# Patient Record
Sex: Female | Born: 1935 | Race: White | Hispanic: No | Smoking: Never smoker
Health system: Southern US, Community
[De-identification: ages and names within clinical notes are randomized; demographics above are authoritative.]

## PROBLEM LIST (undated history)

## (undated) DIAGNOSIS — R42 Dizziness and giddiness: Secondary | ICD-10-CM

## (undated) DIAGNOSIS — F0390 Unspecified dementia without behavioral disturbance: Secondary | ICD-10-CM

## (undated) DIAGNOSIS — Z87828 Personal history of other (healed) physical injury and trauma: Secondary | ICD-10-CM

## (undated) DIAGNOSIS — D649 Anemia, unspecified: Secondary | ICD-10-CM

## (undated) DIAGNOSIS — N390 Urinary tract infection, site not specified: Secondary | ICD-10-CM

## (undated) DIAGNOSIS — E039 Hypothyroidism, unspecified: Secondary | ICD-10-CM

## (undated) DIAGNOSIS — B9620 Unspecified Escherichia coli [E. coli] as the cause of diseases classified elsewhere: Secondary | ICD-10-CM

## (undated) DIAGNOSIS — K259 Gastric ulcer, unspecified as acute or chronic, without hemorrhage or perforation: Secondary | ICD-10-CM

## (undated) DIAGNOSIS — J189 Pneumonia, unspecified organism: Secondary | ICD-10-CM

## (undated) DIAGNOSIS — I10 Essential (primary) hypertension: Secondary | ICD-10-CM

## (undated) DIAGNOSIS — H3530 Unspecified macular degeneration: Secondary | ICD-10-CM

## (undated) DIAGNOSIS — N183 Chronic kidney disease, stage 3 (moderate): Secondary | ICD-10-CM

## (undated) DIAGNOSIS — Z8781 Personal history of (healed) traumatic fracture: Secondary | ICD-10-CM

## (undated) HISTORY — DX: Urinary tract infection, site not specified: N39.0

## (undated) HISTORY — PX: THYROID SURGERY: SHX805

## (undated) HISTORY — DX: Unspecified macular degeneration: H35.30

## (undated) HISTORY — PX: FRACTURE SURGERY: SHX138

## (undated) HISTORY — PX: MASTECTOMY: SHX3

## (undated) HISTORY — PX: ABDOMINAL HYSTERECTOMY: SHX81

## (undated) HISTORY — PX: CARPAL TUNNEL RELEASE: SHX101

## (undated) HISTORY — DX: Unspecified Escherichia coli (E. coli) as the cause of diseases classified elsewhere: B96.20

---

## 2000-12-08 ENCOUNTER — Encounter: Payer: Self-pay | Admitting: *Deleted

## 2000-12-08 ENCOUNTER — Ambulatory Visit (HOSPITAL_COMMUNITY): Admission: RE | Admit: 2000-12-08 | Discharge: 2000-12-08 | Payer: Self-pay | Admitting: *Deleted

## 2001-01-18 ENCOUNTER — Encounter: Payer: Self-pay | Admitting: *Deleted

## 2001-01-18 ENCOUNTER — Ambulatory Visit (HOSPITAL_COMMUNITY): Admission: RE | Admit: 2001-01-18 | Discharge: 2001-01-18 | Payer: Self-pay | Admitting: *Deleted

## 2001-02-27 ENCOUNTER — Encounter: Payer: Self-pay | Admitting: *Deleted

## 2001-02-27 ENCOUNTER — Ambulatory Visit (HOSPITAL_COMMUNITY): Admission: RE | Admit: 2001-02-27 | Discharge: 2001-02-27 | Payer: Self-pay | Admitting: *Deleted

## 2001-11-29 ENCOUNTER — Emergency Department (HOSPITAL_COMMUNITY): Admission: EM | Admit: 2001-11-29 | Discharge: 2001-11-29 | Payer: Self-pay | Admitting: Emergency Medicine

## 2002-12-25 ENCOUNTER — Ambulatory Visit (HOSPITAL_BASED_OUTPATIENT_CLINIC_OR_DEPARTMENT_OTHER): Admission: RE | Admit: 2002-12-25 | Discharge: 2002-12-25 | Payer: Self-pay | Admitting: *Deleted

## 2002-12-25 ENCOUNTER — Encounter: Admission: RE | Admit: 2002-12-25 | Discharge: 2002-12-25 | Payer: Self-pay | Admitting: *Deleted

## 2002-12-25 ENCOUNTER — Encounter: Payer: Self-pay | Admitting: *Deleted

## 2003-01-01 ENCOUNTER — Ambulatory Visit (HOSPITAL_BASED_OUTPATIENT_CLINIC_OR_DEPARTMENT_OTHER): Admission: RE | Admit: 2003-01-01 | Discharge: 2003-01-01 | Payer: Self-pay | Admitting: *Deleted

## 2003-09-26 ENCOUNTER — Ambulatory Visit (HOSPITAL_COMMUNITY): Admission: RE | Admit: 2003-09-26 | Discharge: 2003-09-26 | Payer: Self-pay | Admitting: Family Medicine

## 2003-12-04 ENCOUNTER — Ambulatory Visit (HOSPITAL_COMMUNITY): Admission: RE | Admit: 2003-12-04 | Discharge: 2003-12-04 | Payer: Self-pay | Admitting: Internal Medicine

## 2005-03-11 ENCOUNTER — Ambulatory Visit (HOSPITAL_COMMUNITY): Admission: RE | Admit: 2005-03-11 | Discharge: 2005-03-11 | Payer: Self-pay | Admitting: Family Medicine

## 2005-04-21 ENCOUNTER — Ambulatory Visit: Payer: Self-pay | Admitting: *Deleted

## 2005-05-04 ENCOUNTER — Ambulatory Visit: Payer: Self-pay | Admitting: *Deleted

## 2005-05-04 ENCOUNTER — Ambulatory Visit (HOSPITAL_COMMUNITY): Admission: RE | Admit: 2005-05-04 | Discharge: 2005-05-04 | Payer: Self-pay | Admitting: *Deleted

## 2005-05-11 ENCOUNTER — Ambulatory Visit: Payer: Self-pay | Admitting: *Deleted

## 2006-06-09 ENCOUNTER — Ambulatory Visit (HOSPITAL_COMMUNITY): Admission: RE | Admit: 2006-06-09 | Discharge: 2006-06-09 | Payer: Self-pay | Admitting: Family Medicine

## 2006-08-24 ENCOUNTER — Ambulatory Visit: Payer: Self-pay | Admitting: Internal Medicine

## 2006-09-11 ENCOUNTER — Observation Stay (HOSPITAL_COMMUNITY): Admission: EM | Admit: 2006-09-11 | Discharge: 2006-09-14 | Payer: Self-pay | Admitting: Emergency Medicine

## 2006-09-25 ENCOUNTER — Ambulatory Visit (HOSPITAL_COMMUNITY): Admission: RE | Admit: 2006-09-25 | Discharge: 2006-09-25 | Payer: Self-pay | Admitting: Internal Medicine

## 2006-09-25 ENCOUNTER — Ambulatory Visit: Payer: Self-pay | Admitting: Internal Medicine

## 2006-11-09 ENCOUNTER — Ambulatory Visit: Payer: Self-pay | Admitting: Internal Medicine

## 2006-12-29 ENCOUNTER — Ambulatory Visit (HOSPITAL_COMMUNITY): Admission: RE | Admit: 2006-12-29 | Discharge: 2006-12-29 | Payer: Self-pay | Admitting: Family Medicine

## 2007-11-08 ENCOUNTER — Ambulatory Visit (HOSPITAL_COMMUNITY): Admission: RE | Admit: 2007-11-08 | Discharge: 2007-11-08 | Payer: Self-pay | Admitting: Family Medicine

## 2007-12-26 ENCOUNTER — Ambulatory Visit (HOSPITAL_COMMUNITY): Admission: RE | Admit: 2007-12-26 | Discharge: 2007-12-26 | Payer: Self-pay | Admitting: Family Medicine

## 2008-01-20 ENCOUNTER — Emergency Department (HOSPITAL_COMMUNITY): Admission: EM | Admit: 2008-01-20 | Discharge: 2008-01-20 | Payer: Self-pay | Admitting: Emergency Medicine

## 2009-05-07 ENCOUNTER — Ambulatory Visit (HOSPITAL_COMMUNITY): Admission: RE | Admit: 2009-05-07 | Discharge: 2009-05-07 | Payer: Self-pay | Admitting: Family Medicine

## 2009-06-04 ENCOUNTER — Ambulatory Visit (HOSPITAL_COMMUNITY): Admission: RE | Admit: 2009-06-04 | Discharge: 2009-06-04 | Payer: Self-pay | Admitting: Family Medicine

## 2009-06-24 ENCOUNTER — Inpatient Hospital Stay (HOSPITAL_COMMUNITY): Admission: EM | Admit: 2009-06-24 | Discharge: 2009-06-29 | Payer: Self-pay | Admitting: Emergency Medicine

## 2009-06-25 ENCOUNTER — Encounter: Payer: Self-pay | Admitting: Gastroenterology

## 2009-06-25 ENCOUNTER — Ambulatory Visit: Payer: Self-pay | Admitting: Gastroenterology

## 2009-06-25 ENCOUNTER — Telehealth: Payer: Self-pay | Admitting: Gastroenterology

## 2009-06-27 ENCOUNTER — Ambulatory Visit: Payer: Self-pay | Admitting: Internal Medicine

## 2009-06-29 ENCOUNTER — Inpatient Hospital Stay: Admission: AD | Admit: 2009-06-29 | Discharge: 2009-07-09 | Payer: Self-pay | Admitting: Internal Medicine

## 2009-07-29 ENCOUNTER — Encounter: Payer: Self-pay | Admitting: Gastroenterology

## 2009-07-30 DIAGNOSIS — K5730 Diverticulosis of large intestine without perforation or abscess without bleeding: Secondary | ICD-10-CM | POA: Insufficient documentation

## 2009-07-30 DIAGNOSIS — M810 Age-related osteoporosis without current pathological fracture: Secondary | ICD-10-CM | POA: Insufficient documentation

## 2009-07-30 DIAGNOSIS — M479 Spondylosis, unspecified: Secondary | ICD-10-CM | POA: Insufficient documentation

## 2009-07-30 DIAGNOSIS — I10 Essential (primary) hypertension: Secondary | ICD-10-CM | POA: Insufficient documentation

## 2009-07-30 DIAGNOSIS — R1310 Dysphagia, unspecified: Secondary | ICD-10-CM | POA: Insufficient documentation

## 2009-07-31 ENCOUNTER — Inpatient Hospital Stay (HOSPITAL_COMMUNITY): Admission: AD | Admit: 2009-07-31 | Discharge: 2009-08-02 | Payer: Self-pay

## 2009-07-31 ENCOUNTER — Ambulatory Visit: Payer: Self-pay | Admitting: Gastroenterology

## 2009-07-31 DIAGNOSIS — K279 Peptic ulcer, site unspecified, unspecified as acute or chronic, without hemorrhage or perforation: Secondary | ICD-10-CM

## 2009-07-31 DIAGNOSIS — K920 Hematemesis: Secondary | ICD-10-CM

## 2009-08-01 ENCOUNTER — Ambulatory Visit: Payer: Self-pay | Admitting: Gastroenterology

## 2009-08-04 LAB — CONVERTED CEMR LAB
HCT: 22.7 % — ABNORMAL LOW (ref 36.0–46.0)
MCHC: 33.7 g/dL (ref 30.0–36.0)
MCV: 92.1 fL (ref 78.0–100.0)
RBC: 2.47 M/uL — ABNORMAL LOW (ref 3.87–5.11)
RDW: 15.6 % — ABNORMAL HIGH (ref 11.5–15.5)

## 2009-08-18 ENCOUNTER — Ambulatory Visit: Payer: Self-pay | Admitting: Gastroenterology

## 2009-08-18 DIAGNOSIS — R197 Diarrhea, unspecified: Secondary | ICD-10-CM | POA: Insufficient documentation

## 2009-08-19 ENCOUNTER — Encounter: Payer: Self-pay | Admitting: Gastroenterology

## 2009-08-20 ENCOUNTER — Telehealth (INDEPENDENT_AMBULATORY_CARE_PROVIDER_SITE_OTHER): Payer: Self-pay

## 2009-08-20 ENCOUNTER — Encounter: Payer: Self-pay | Admitting: Gastroenterology

## 2009-08-20 DIAGNOSIS — K922 Gastrointestinal hemorrhage, unspecified: Secondary | ICD-10-CM | POA: Insufficient documentation

## 2009-08-21 ENCOUNTER — Encounter: Payer: Self-pay | Admitting: Gastroenterology

## 2009-08-27 ENCOUNTER — Telehealth (INDEPENDENT_AMBULATORY_CARE_PROVIDER_SITE_OTHER): Payer: Self-pay

## 2009-08-31 ENCOUNTER — Encounter: Payer: Self-pay | Admitting: Gastroenterology

## 2009-09-15 ENCOUNTER — Encounter: Payer: Self-pay | Admitting: Urgent Care

## 2009-09-22 ENCOUNTER — Ambulatory Visit: Payer: Self-pay | Admitting: Gastroenterology

## 2009-09-23 DIAGNOSIS — K259 Gastric ulcer, unspecified as acute or chronic, without hemorrhage or perforation: Secondary | ICD-10-CM | POA: Insufficient documentation

## 2009-09-24 ENCOUNTER — Encounter: Payer: Self-pay | Admitting: Gastroenterology

## 2009-10-01 ENCOUNTER — Ambulatory Visit (HOSPITAL_COMMUNITY): Admission: RE | Admit: 2009-10-01 | Discharge: 2009-10-01 | Payer: Self-pay | Admitting: Gastroenterology

## 2009-10-01 ENCOUNTER — Ambulatory Visit: Payer: Self-pay | Admitting: Gastroenterology

## 2009-10-09 ENCOUNTER — Encounter: Payer: Self-pay | Admitting: Urgent Care

## 2009-10-28 ENCOUNTER — Encounter: Payer: Self-pay | Admitting: Urgent Care

## 2009-11-10 ENCOUNTER — Encounter: Payer: Self-pay | Admitting: Gastroenterology

## 2009-11-12 ENCOUNTER — Encounter: Payer: Self-pay | Admitting: Gastroenterology

## 2009-11-13 ENCOUNTER — Encounter: Payer: Self-pay | Admitting: Gastroenterology

## 2009-11-16 ENCOUNTER — Encounter: Payer: Self-pay | Admitting: Urgent Care

## 2009-11-23 ENCOUNTER — Encounter: Payer: Self-pay | Admitting: Urgent Care

## 2009-11-30 ENCOUNTER — Encounter: Payer: Self-pay | Admitting: Urgent Care

## 2009-12-08 ENCOUNTER — Encounter: Payer: Self-pay | Admitting: Urgent Care

## 2009-12-15 ENCOUNTER — Encounter: Payer: Self-pay | Admitting: Urgent Care

## 2009-12-22 ENCOUNTER — Encounter (INDEPENDENT_AMBULATORY_CARE_PROVIDER_SITE_OTHER): Payer: Self-pay | Admitting: *Deleted

## 2010-01-29 ENCOUNTER — Encounter: Payer: Self-pay | Admitting: Gastroenterology

## 2010-03-06 ENCOUNTER — Emergency Department (HOSPITAL_COMMUNITY): Admission: EM | Admit: 2010-03-06 | Discharge: 2010-03-06 | Payer: Self-pay | Admitting: Emergency Medicine

## 2010-03-22 ENCOUNTER — Encounter: Payer: Self-pay | Admitting: Urgent Care

## 2010-04-22 ENCOUNTER — Encounter: Payer: Self-pay | Admitting: Gastroenterology

## 2010-09-23 NOTE — Letter (Signed)
Summary: Internal Other/Rx faxed  Internal Other/Rx faxed   Imported By: Cloria Spring LPN 04/54/0981 19:14:78  _____________________________________________________________________  External Attachment:    Type:   Image     Comment:   External Document

## 2010-09-23 NOTE — Medication Information (Signed)
Summary: Tax adviser   Imported By: Rexene Alberts 03/22/2010 11:14:55  _____________________________________________________________________  External Attachment:    Type:   Image     Comment:   External Document  Appended Document: RX Folder    Prescriptions: BENEFIBER  POWD (WHEAT DEXTRIN) 2 teaspoons BID  #245 grams x 11   Entered and Authorized by:   Joselyn Arrow FNP-BC   Signed by:   Joselyn Arrow FNP-BC on 03/22/2010   Method used:   Electronically to        [facility] (retail)       [address]       [place], [place]  [address]       Ph: [phone]       Fax: [phone]   RxID:   [id number]

## 2010-09-23 NOTE — Medication Information (Signed)
Summary: Tax adviser   Imported By: Ricard Dillon 11/13/2009 14:30:27  _____________________________________________________________________  External Attachment:    Type:   Image     Comment:   External Document  Appended Document: RX Folder duplicate

## 2010-09-23 NOTE — Medication Information (Signed)
Summary: Tax adviser   Imported By: Diana Eves 11/30/2009 09:39:34  _____________________________________________________________________  External Attachment:    Type:   Image     Comment:   External Document  Appended Document: RX FolderBENEFIBER    Prescriptions: BENEFIBER  POWD (WHEAT DEXTRIN) 2 teaspoons BID  #245 grams x 5   Entered and Authorized by:   Joselyn Arrow FNP-BC   Signed by:   Joselyn Arrow FNP-BC on 11/30/2009   Method used:   Electronically to        [facility] (retail)       [address]       [place], [place]  [address]       Ph: [phone]       Fax: [phone]   RxID:   [id number]

## 2010-09-23 NOTE — Medication Information (Signed)
Summary: Tax adviser   Imported By: Ricard Dillon 11/16/2009 13:12:59  _____________________________________________________________________  External Attachment:    Type:   Image     Comment:   External Document  Appended Document: RX Folder Please see why we are getting multiple requests from RX care?  Appended Document: RX Folder spoke with Tobi Bastos at [place] and she stated to disregard

## 2010-09-23 NOTE — Medication Information (Signed)
Summary: Tax adviser   Imported By: Diana Eves 09/15/2009 09:42:17  _____________________________________________________________________  External Attachment:    Type:   Image     Comment:   External Document  Appended Document: RX Folder    Prescriptions: BENEFIBER  POWD (WHEAT DEXTRIN) 2 teaspoons BID  #245 grams x 5   Entered and Authorized by:   Joselyn Arrow FNP-BC   Signed by:   Joselyn Arrow FNP-BC on 09/15/2009   Method used:   Electronically to        [facility] (retail)       [address]       [place], [place]  [address]       Ph: [phone]       Fax: [phone]   RxID:   [id number] POLYETHYLENE GLYCOL 3350  POWD (POLYETHYLENE GLYCOL 3350) 17 grams as needed as directed daily for constipation  #527 grams x 11   Entered and Authorized by:   Joselyn Arrow FNP-BC   Signed by:   Joselyn Arrow FNP-BC on 09/15/2009   Method used:   Electronically to        [facility] (retail)       [address]       [place], [place]  [address]       Ph: [phone]       Fax: [phone]   RxID:   [id number]

## 2010-09-23 NOTE — Progress Notes (Signed)
Summary: pt started diarrhea again  Phone Note Call from Patient   Caller: Daughter-in-law / Erskine Squibb Summary of Call: Pt's daughter in law called and said pt has started having diarrhea again. She had some yesterday, but thought it was due to a  salad she had eaten. But this AM she is having more and had to leave the breakfast table, it was so urgent. Wanted to know, should she just start back on Immodium or please advise. she is aware Dr. Darrick Penna away, and Dr. Jena Gauss will be informed in her absence. Initial call taken by: Cloria Spring LPN,  August 27, 2009 10:12 AM     Appended Document: pt started diarrhea again OK to use Imodium 2mg  by mouth two times a day as needed.  Make sure Miralax is being given only as needed.  Keep OV as planned.  Appended Document: pt started diarrhea again Pt's daughter-in-law Erskine Squibb informed. Also, [facility] @ [address] informed and order as above printed and faxed.

## 2010-09-23 NOTE — Letter (Signed)
Summary: Recall Office Visit  [name]  [address]   [place], [place] [address]   Phone: [phone]  Fax: [phone]      Dec 22, 2009   [name] [address] [place], [place]  [address] 22-Oct-1919   Dear [name],   According to our records, it is time for you to schedule a follow-up office visit with [place].   At your convenience, please call [phone] to schedule an office visit. If you have any questions, concerns, or feel that this letter is in error, we would appreciate your call.   Sincerely,    Diana Eves  [facility] Ph: [phone]   Fax: [phone]

## 2010-09-23 NOTE — Medication Information (Signed)
Summary: Tax adviser   Imported By: Peggyann Shoals 12/15/2009 09:32:27  _____________________________________________________________________  External Attachment:    Type:   Image     Comment:   External Document  Appended Document: RX FolderLOPERAMIDE    Prescriptions: IMODIUM A-D 2 MG TABS (LOPERAMIDE HCL) one by mouth two times a day as needed diarrhea  #60 x 1   Entered and Authorized by:   Joselyn Arrow FNP-BC   Signed by:   Joselyn Arrow FNP-BC on 12/15/2009   Method used:   Electronically to        [facility] (retail)       [address]       [place], [place]  [address]       Ph: [phone]       Fax: [phone]   RxID:   [id number]

## 2010-09-23 NOTE — Medication Information (Signed)
Summary: Tax adviser   Imported By: Diana Eves 11/23/2009 10:35:27  _____________________________________________________________________  External Attachment:    Type:   Image     Comment:   External Document  Appended Document: RX Folder DIGESTIVE ADV  Please fax rx to [facility]  Prescriptions: DIGESTIVE ADVANTAGE IBS 1 by mouth daily for IBS  #31 x 5   Entered and Authorized by:   Joselyn Arrow FNP-BC   Signed by:   Joselyn Arrow FNP-BC on 11/23/2009   Method used:   Printed then faxed to ...       [facility] (retail)       [address]       [place], [place]  [address]       Ph: [phone]       Fax: [phone]   RxID:   [id number]     Appended Document: RX Folder Faxed.

## 2010-09-23 NOTE — Medication Information (Signed)
Summary: DIGESTIVE ADV IRRIT  DIGESTIVE ADV IRRIT   Imported By: Rexene Alberts 04/22/2010 15:25:04  _____________________________________________________________________  External Attachment:    Type:   Image     Comment:   External Document  Appended Document: DIGESTIVE ADV IRRIT     PLEASE CALL [facility] AND OK REFILLS x 6 ON   DIGESTIVE ADVANAGE IBS TAKE ONE by mouth DAILY #31 6RFS  Appended Document: DIGESTIVE ADV IRRIT [facility] at [facility] aware

## 2010-09-23 NOTE — Medication Information (Signed)
Summary: Tax adviser   Imported By: Ricard Dillon 11/12/2009 16:40:58  _____________________________________________________________________  External Attachment:    Type:   Image     Comment:   External Document  Appended Document: RX Folder - loperamide   Appended Document: RX Folder Please find out if they rec'd emr refill. It showed up until I signed it. Please make sure they only rec'd one. If they need one, you can call in imodium ad 2mg  #30, one by mouth two times a day as needed diarrhea, 1 refill.  Appended Document: RX Folder Spoke with [facility] @ [facility]. They received two Rx's for Immodium,  i told her to disregard one of them.

## 2010-09-23 NOTE — Medication Information (Signed)
Summary: Tax adviser   Imported By: Diana Eves 11/30/2009 12:27:33  _____________________________________________________________________  External Attachment:    Type:   Image     Comment:   External Document  Appended Document: RX FolderOMEPRAZOLE    Prescriptions: OMEPRAZOLE 20 MG CPDR (OMEPRAZOLE) by mouth bid  #31 x 11   Entered and Authorized by:   Joselyn Arrow FNP-BC   Signed by:   Joselyn Arrow FNP-BC on 11/30/2009   Method used:   Electronically to        [facility] (retail)       [address]       [place], [place]  [address]       Ph: [phone]       Fax: [phone]   RxID:   [id number]     Appended Document: RX Folder [facility] called- pt is on omeprazole once daily- refill was sent back to them with two times a day directions. please clarify directions.  Appended Document: RX FolderOMEPRAZOLE    Prescriptions: OMEPRAZOLE 20 MG CPDR (OMEPRAZOLE) 1 by mouth daily  #31 x 11   Entered and Authorized by:   Joselyn Arrow FNP-BC   Signed by:   Joselyn Arrow FNP-BC on 12/01/2009   Method used:   Electronically to        [facility] (retail)       [address]       [place], [place]  [address]       Ph: [phone]       Fax: [phone]   RxID:   [id number]

## 2010-09-23 NOTE — Medication Information (Signed)
Summary: Tax adviser   Imported By: Diana Eves 10/28/2009 11:48:47  _____________________________________________________________________  External Attachment:    Type:   Image     Comment:   External Document  Appended Document: RX Folder Please get from PCP.  Appended Document: RX Folder Informed [facility] @ [facility].  Appended Document: RX Folder Pt called and informed her.

## 2010-09-23 NOTE — Letter (Signed)
Summary: EGD ORDER  EGD ORDER   Imported By: Ricard Dillon 09/24/2009 12:47:23  _____________________________________________________________________  External Attachment:    Type:   Image     Comment:   External Document

## 2010-09-23 NOTE — Letter (Signed)
Summary: internal other /Rx care fax  internal other /[facility] fax   Imported By: Cloria Spring LPN 16/05/9603 54:09:81  _____________________________________________________________________  External Attachment:    Type:   Image     Comment:   External Document

## 2010-09-23 NOTE — Medication Information (Signed)
Summary: Tax adviser   Imported By: Diana Eves 01/29/2010 13:40:35  _____________________________________________________________________  External Attachment:    Type:   Image     Comment:   External Document  Appended Document: RX Folder-imodium    Prescriptions: IMODIUM A-D 2 MG TABS (LOPERAMIDE HCL) one by mouth two times a day as needed diarrhea  #60 x 3   Entered and Authorized by:   Leanna Battles. Dixon Boos   Signed by:   Leanna Battles Artavis Cowie PA-C on 01/29/2010   Method used:   Electronically to        [facility] (retail)       [address]       [place], [place]  [address]       Ph: [phone]       Fax: [phone]   RxID:   [id number]

## 2010-09-23 NOTE — Medication Information (Signed)
Summary: Tax adviser   Imported By: Diana Eves 09/15/2009 09:50:59  _____________________________________________________________________  External Attachment:    Type:   Image     Comment:   External Document  Appended Document: RX Folder See 09/15/08 note

## 2010-09-23 NOTE — Assessment & Plan Note (Signed)
Summary: Gi bleed/dysphagia/diarrhea   Visit Type:  Follow-up Visit Primary Care Provider:  Phillips Odor, M.D.  Chief Complaint:  F/U GI Bleed/dysphagia/diarrhea.  History of Present Illness: Had diarrhea after lettuce, salad, and tomato soup. Ate hamburger with tomato and no problems. . Had cranbery, chicken, with [place] dressing. No problems with OJ. Able to tolerate. Loose stool after breakfast when taking protein shakes. Eating causes bloating and diarrhea while on shakes. Protein drink and so she's off of that for about two weeks. No fever or chills. No blood is stool. Bms: not every day, a little at night and none the next day. No nausea or vomiting. Swallowing ok.    Current Medications (verified): 1)  Synthroid 100 Mcg Tabs (Levothyroxine Sodium) .... Once Daily 2)  Vitamin B Complex .... Take 1 Tablet By Mouth Once A Day 3)  Multi-Vitamin .... Take 1 Tablet By Mouth Once A Day 4)  Vitamin D 400 Iu .... Take 1 Tablet By Mouth Two Times A Day 5)  Lutein 6 Mg Caps (Lutein) .... Take 1 Tablet By Mouth Once A Day 6)  Diovan 80 Mg Tabs (Valsartan) .... 1/2 Tablet Daily 7)  Claritin 10 Mg Tabs (Loratadine) .... As Needed 8)  Tylenol 325 Mg .... One Tablet Every 4 Hours As Needed For Pain 9)  Robitussin Dm .... As Needed 10)  Aricept 10 Mg Tabs (Donepezil Hcl) .... Take 1 Tablet By Mouth Once A Day 11)  Polyethylene Glycol 3350  Powd (Polyethylene Glycol 3350) .Marland KitchenMarland KitchenMarland Kitchen 17 Grams As Needed As Directed Daily For Constipation 12)  Antivert 25 Mg Tabs (Meclizine Hcl) .... As Needed 13)  Tylenol 1000 Mg .... At Bedtime 14)  Ferrous Sulfate 325 Mg .... Take 1 Tablet By Mouth Once A Day 15)  Biotin 300 Mg .... Take 1 Tablet By Mouth Once A Day 16)  Nasonex .... As Needed 17)  Simethicone .... As Needed 18)  Triamcinolone .... As Directed 19)  Ortho Gel .... As Directed 20)  Benefiber  Powd (Wheat Dextrin) .... 2 Teaspoons Bid 21)  Namenda 10 Mg Tabs (Memantine Hcl) .... Take 1 Tablet By Mouth Once A  Day 22)  Imodium A-D 2 Mg Tabs (Loperamide Hcl) .... As Needed  Allergies (verified): 1)  ! Augmentin 2)  ! Keflex  Past History:  Past Medical History: 2008 SCHATZKI'S RING, ? CERVICAL WEB--> EGD/DIL 56 Fr Arthritis Diverticulosis Hypertension Hypothyroidism Osteoporosis 2010: GI Bleed/Melena: EGD/Bx-large gastric ulcer from NSAIDs, H. pylori neg --> DEC 2010: Melena/hematemesis-ulcer Pt on ASA.  Vital Signs:  Patient profile:   77 year old female Height:      62 inches Weight:      106 pounds Temp:     97.9 degrees F oral Pulse rate:   72 / minute BP sitting:   138 / 74  (right arm) Cuff size:   regular  Vitals Entered By: Cloria Spring LPN (September 22, 2009 3:03 PM)  Physical Exam  General:  Well developed, well nourished, no acute distress. Head:  Normocephalic and atraumatic. Lungs:  Clear throughout to auscultation. Heart:  Regular rate and rhythm; no murmurs Abdomen:  Soft, nontender and nondistended. Normal bowel sounds.  Impression & Recommendations:  Problem # 1:  DIARRHEA, ACUTE (ICD-787.91) Assessment Improved Resolved. Now intermittent loose stools after meals. Significant psychosocial stressors being at the nursing facility. Take Digestive Advantage-LI daily. Continue Benefiber two times a day. Use Miralax or Imodium as needed. OPV in 3 mos.   Problem # 2:  GI  BLEEDING (ICD-578.9) Assessment: Improved EGD/?dilation in Feb 2011. Reassess need to restart ASA after EGD.  CC: PCP  Problem # 3:  DYSPHAGIA UNSPECIFIED (ICD-787.20) Assessment: Improved ? dilation on Feb 2011.  Other Orders: Est. Patient Level III ([address])

## 2010-09-23 NOTE — Medication Information (Signed)
Summary: Tax adviser   Imported By: Diana Eves 10/09/2009 11:41:32  _____________________________________________________________________  External Attachment:    Type:   Image     Comment:   External Document  Appended Document: RX Folder Spoke w/ [facility] pharmacist.  Will contact PCP for Rx.

## 2010-09-23 NOTE — Letter (Signed)
Summary: Internal Other/ order faxed to [facility]  Internal Other/ order faxed to [facility]   Imported By: Cloria Spring LPN 16/05/9603 54:09:81  _____________________________________________________________________  External Attachment:    Type:   Image     Comment:   External Document

## 2010-10-19 ENCOUNTER — Encounter: Payer: Self-pay | Admitting: Urgent Care

## 2010-10-28 NOTE — Medication Information (Signed)
Summary: OMEPRAZOLE 20MG   OMEPRAZOLE 20MG    Imported By: Rexene Alberts 10/19/2010 11:11:32  _____________________________________________________________________  External Attachment:    Type:   Image     Comment:   External Document  Appended Document: OMEPRAZOLE 20MG     Prescriptions: OMEPRAZOLE 20 MG CPDR (OMEPRAZOLE) 1 by mouth daily  #31 x 11   Entered and Authorized by:   Joselyn Arrow FNP-BC   Signed by:   Joselyn Arrow FNP-BC on 10/19/2010   Method used:   Electronically to        [facility] (retail)       [address]       [place], [place]  [address]       Ph: [phone]       Fax: [phone]   RxID:   [id number]

## 2010-11-23 LAB — DIFFERENTIAL
Basophils Absolute: 0 10*3/uL (ref 0.0–0.1)
Basophils Absolute: 0.1 10*3/uL (ref 0.0–0.1)
Eosinophils Absolute: 0.6 10*3/uL (ref 0.0–0.7)
Eosinophils Relative: 8 % — ABNORMAL HIGH (ref 0–5)
Lymphocytes Relative: 31 % (ref 12–46)
Lymphocytes Relative: 33 % (ref 12–46)
Lymphocytes Relative: 34 % (ref 12–46)
Lymphs Abs: 2 10*3/uL (ref 0.7–4.0)
Lymphs Abs: 2.1 10*3/uL (ref 0.7–4.0)
Lymphs Abs: 2.3 10*3/uL (ref 0.7–4.0)
Monocytes Absolute: 0.8 10*3/uL (ref 0.1–1.0)
Monocytes Relative: 11 % (ref 3–12)
Neutro Abs: 2.6 10*3/uL (ref 1.7–7.7)
Neutro Abs: 3.1 10*3/uL (ref 1.7–7.7)
Neutrophils Relative %: 44 % (ref 43–77)

## 2010-11-23 LAB — CBC
HCT: 22.9 % — ABNORMAL LOW (ref 36.0–46.0)
HCT: 27 % — ABNORMAL LOW (ref 36.0–46.0)
HCT: 28.9 % — ABNORMAL LOW (ref 36.0–46.0)
Hemoglobin: 7.8 g/dL — ABNORMAL LOW (ref 12.0–15.0)
Hemoglobin: 9.1 g/dL — ABNORMAL LOW (ref 12.0–15.0)
Hemoglobin: 9.8 g/dL — ABNORMAL LOW (ref 12.0–15.0)
MCHC: 33.9 g/dL (ref 30.0–36.0)
MCHC: 34.1 g/dL (ref 30.0–36.0)
MCV: 91.5 fL (ref 78.0–100.0)
MCV: 92.3 fL (ref 78.0–100.0)
MCV: 94 fL (ref 78.0–100.0)
Platelets: 216 10*3/uL (ref 150–400)
Platelets: 221 K/uL (ref 150–400)
RBC: 2.5 MIL/uL — ABNORMAL LOW (ref 3.87–5.11)
RBC: 3.13 MIL/uL — ABNORMAL LOW (ref 3.87–5.11)
RDW: 15.2 % (ref 11.5–15.5)
RDW: 15.8 % — ABNORMAL HIGH (ref 11.5–15.5)
WBC: 5.9 10*3/uL (ref 4.0–10.5)
WBC: 6.5 K/uL (ref 4.0–10.5)
WBC: 7.5 10*3/uL (ref 4.0–10.5)

## 2010-11-23 LAB — COMPREHENSIVE METABOLIC PANEL
ALT: 19 U/L (ref 0–35)
CO2: 24 mEq/L (ref 19–32)
Calcium: 8.7 mg/dL (ref 8.4–10.5)
Chloride: 105 mEq/L (ref 96–112)
GFR calc Af Amer: 44 mL/min — ABNORMAL LOW (ref >60)
GFR calc non Af Amer: 36 mL/min — ABNORMAL LOW (ref >60)
Glucose, Bld: 113 mg/dL — ABNORMAL HIGH (ref 70–99)
Total Bilirubin: 0.5 mg/dL (ref 0.3–1.2)

## 2010-11-23 LAB — CROSSMATCH
ABO/RH(D): O POS
Antibody Screen: NEGATIVE

## 2010-11-23 LAB — PROTIME-INR
INR: 1.12 (ref 0.00–1.49)
Prothrombin Time: 14.3 s (ref 11.6–15.2)

## 2010-11-23 LAB — HEMOGLOBIN AND HEMATOCRIT, BLOOD
HCT: 28.4 % — ABNORMAL LOW (ref 36.0–46.0)
Hemoglobin: 9.6 g/dL — ABNORMAL LOW (ref 12.0–15.0)

## 2010-11-23 LAB — BASIC METABOLIC PANEL
BUN: 20 mg/dL (ref 6–23)
Calcium: 8.7 mg/dL (ref 8.4–10.5)
GFR calc non Af Amer: 36 mL/min — ABNORMAL LOW (ref >60)
Glucose, Bld: 138 mg/dL — ABNORMAL HIGH (ref 70–99)
Potassium: 3.5 mEq/L (ref 3.5–5.1)
Sodium: 138 mEq/L (ref 135–145)

## 2010-11-24 LAB — COMPREHENSIVE METABOLIC PANEL
Albumin: 3.3 g/dL — ABNORMAL LOW (ref 3.5–5.2)
BUN: 15 mg/dL (ref 6–23)
BUN: 19 mg/dL (ref 6–23)
CO2: 24 mEq/L (ref 19–32)
Calcium: 8.1 mg/dL — ABNORMAL LOW (ref 8.4–10.5)
Calcium: 8.3 mg/dL — ABNORMAL LOW (ref 8.4–10.5)
Chloride: 100 mEq/L (ref 96–112)
Creatinine, Ser: 1.15 mg/dL (ref 0.4–1.2)
Creatinine, Ser: 1.31 mg/dL — ABNORMAL HIGH (ref 0.4–1.2)
GFR calc non Af Amer: 45 mL/min — ABNORMAL LOW (ref >60)
Glucose, Bld: 100 mg/dL — ABNORMAL HIGH (ref 70–99)
Total Bilirubin: 0.6 mg/dL (ref 0.3–1.2)

## 2010-11-24 LAB — DIFFERENTIAL
Basophils Absolute: 0 10*3/uL (ref 0.0–0.1)
Basophils Absolute: 0 10*3/uL (ref 0.0–0.1)
Basophils Absolute: 0 10*3/uL (ref 0.0–0.1)
Basophils Absolute: 0.1 10*3/uL (ref 0.0–0.1)
Basophils Relative: 0 % (ref 0–1)
Basophils Relative: 0 % (ref 0–1)
Basophils Relative: 0 % (ref 0–1)
Basophils Relative: 0 % (ref 0–1)
Basophils Relative: 1 % (ref 0–1)
Eosinophils Absolute: 0.2 10*3/uL (ref 0.0–0.7)
Eosinophils Absolute: 0.3 10*3/uL (ref 0.0–0.7)
Eosinophils Absolute: 0.4 10*3/uL (ref 0.0–0.7)
Eosinophils Absolute: 0.6 10*3/uL (ref 0.0–0.7)
Eosinophils Relative: 2 % (ref 0–5)
Eosinophils Relative: 2 % (ref 0–5)
Eosinophils Relative: 4 % (ref 0–5)
Eosinophils Relative: 5 % (ref 0–5)
Eosinophils Relative: 6 % — ABNORMAL HIGH (ref 0–5)
Eosinophils Relative: 6 % — ABNORMAL HIGH (ref 0–5)
Lymphocytes Relative: 10 % — ABNORMAL LOW (ref 12–46)
Lymphocytes Relative: 13 % (ref 12–46)
Lymphocytes Relative: 15 % (ref 12–46)
Lymphocytes Relative: 15 % (ref 12–46)
Lymphs Abs: 1.2 10*3/uL (ref 0.7–4.0)
Lymphs Abs: 1.3 10*3/uL (ref 0.7–4.0)
Lymphs Abs: 1.3 10*3/uL (ref 0.7–4.0)
Monocytes Absolute: 0.7 10*3/uL (ref 0.1–1.0)
Monocytes Absolute: 0.7 10*3/uL (ref 0.1–1.0)
Monocytes Absolute: 0.8 10*3/uL (ref 0.1–1.0)
Monocytes Absolute: 0.9 10*3/uL (ref 0.1–1.0)
Monocytes Relative: 7 % (ref 3–12)
Monocytes Relative: 8 % (ref 3–12)
Monocytes Relative: 8 % (ref 3–12)
Monocytes Relative: 9 % (ref 3–12)
Neutro Abs: 10.7 10*3/uL — ABNORMAL HIGH (ref 1.7–7.7)
Neutro Abs: 6.5 10*3/uL (ref 1.7–7.7)
Neutro Abs: 7.9 10*3/uL — ABNORMAL HIGH (ref 1.7–7.7)
Neutrophils Relative %: 70 % (ref 43–77)
Neutrophils Relative %: 75 % (ref 43–77)
Neutrophils Relative %: 79 % — ABNORMAL HIGH (ref 43–77)
Neutrophils Relative %: 82 % — ABNORMAL HIGH (ref 43–77)

## 2010-11-24 LAB — CBC
HCT: 22.3 % — ABNORMAL LOW (ref 36.0–46.0)
HCT: 27 % — ABNORMAL LOW (ref 36.0–46.0)
HCT: 28.3 % — ABNORMAL LOW (ref 36.0–46.0)
HCT: 28.3 % — ABNORMAL LOW (ref 36.0–46.0)
HCT: 29.3 % — ABNORMAL LOW (ref 36.0–46.0)
HCT: 30.7 % — ABNORMAL LOW (ref 36.0–46.0)
HCT: 30.9 % — ABNORMAL LOW (ref 36.0–46.0)
Hemoglobin: 10.1 g/dL — ABNORMAL LOW (ref 12.0–15.0)
Hemoglobin: 9.5 g/dL — ABNORMAL LOW (ref 12.0–15.0)
Hemoglobin: 9.7 g/dL — ABNORMAL LOW (ref 12.0–15.0)
MCHC: 34.3 g/dL (ref 30.0–36.0)
MCHC: 34.4 g/dL (ref 30.0–36.0)
MCHC: 34.6 g/dL (ref 30.0–36.0)
MCHC: 34.8 g/dL (ref 30.0–36.0)
MCHC: 35 g/dL (ref 30.0–36.0)
MCHC: 35 g/dL (ref 30.0–36.0)
MCHC: 35 g/dL (ref 30.0–36.0)
MCV: 90.3 fL (ref 78.0–100.0)
MCV: 91 fL (ref 78.0–100.0)
MCV: 91.1 fL (ref 78.0–100.0)
MCV: 91.7 fL (ref 78.0–100.0)
Platelets: 299 10*3/uL (ref 150–400)
Platelets: 308 10*3/uL (ref 150–400)
Platelets: 320 10*3/uL (ref 150–400)
Platelets: 362 10*3/uL (ref 150–400)
RBC: 2.97 MIL/uL — ABNORMAL LOW (ref 3.87–5.11)
RBC: 2.97 MIL/uL — ABNORMAL LOW (ref 3.87–5.11)
RBC: 3.15 MIL/uL — ABNORMAL LOW (ref 3.87–5.11)
RBC: 3.4 MIL/uL — ABNORMAL LOW (ref 3.87–5.11)
RDW: 15.5 % (ref 11.5–15.5)
RDW: 15.5 % (ref 11.5–15.5)
RDW: 15.8 % — ABNORMAL HIGH (ref 11.5–15.5)
WBC: 10.2 10*3/uL (ref 4.0–10.5)

## 2010-11-24 LAB — CROSSMATCH: ABO/RH(D): O POS

## 2010-11-24 LAB — CARDIAC PANEL(CRET KIN+CKTOT+MB+TROPI)
Relative Index: INVALID (ref 0.0–2.5)
Relative Index: INVALID (ref 0.0–2.5)
Relative Index: INVALID (ref 0.0–2.5)
Troponin I: 0.01 ng/mL (ref 0.00–0.06)
Troponin I: 0.03 ng/mL (ref 0.00–0.06)

## 2010-11-24 LAB — HEPATIC FUNCTION PANEL
AST: 22 U/L (ref 0–37)
Bilirubin, Direct: 0.1 mg/dL (ref 0.0–0.3)
Indirect Bilirubin: 0.6 mg/dL (ref 0.3–0.9)
Total Bilirubin: 0.7 mg/dL (ref 0.3–1.2)

## 2010-11-24 LAB — LIPID PANEL
Cholesterol: 130 mg/dL (ref 0–200)
HDL: 47 mg/dL (ref >39)
LDL Cholesterol: 65 mg/dL (ref 0–99)
Triglycerides: 89 mg/dL (ref <150)

## 2010-11-24 LAB — GLUCOSE, CAPILLARY: Glucose-Capillary: 94 mg/dL (ref 70–99)

## 2010-11-24 LAB — BASIC METABOLIC PANEL
BUN: 8 mg/dL (ref 6–23)
BUN: 8 mg/dL (ref 6–23)
CO2: 23 mEq/L (ref 19–32)
Calcium: 7.7 mg/dL — ABNORMAL LOW (ref 8.4–10.5)
Calcium: 8.4 mg/dL (ref 8.4–10.5)
Creatinine, Ser: 1.11 mg/dL (ref 0.4–1.2)
Creatinine, Ser: 1.13 mg/dL (ref 0.4–1.2)
GFR calc non Af Amer: 46 mL/min — ABNORMAL LOW (ref >60)
GFR calc non Af Amer: 46 mL/min — ABNORMAL LOW (ref >60)
Glucose, Bld: 79 mg/dL (ref 70–99)
Sodium: 137 mEq/L (ref 135–145)

## 2010-11-24 LAB — APTT: aPTT: 35 seconds (ref 24–37)

## 2010-11-24 LAB — ABO/RH: ABO/RH(D): O POS

## 2010-11-24 LAB — VITAMIN B12: Vitamin B-12: 629 pg/mL (ref 211–911)

## 2010-11-24 LAB — PHOSPHORUS: Phosphorus: 3 mg/dL (ref 2.3–4.6)

## 2010-11-24 LAB — PROTIME-INR: INR: 1.25 (ref 0.00–1.49)

## 2010-11-24 LAB — IRON AND TIBC: Iron: 18 ug/dL — ABNORMAL LOW (ref 42–135)

## 2010-11-24 LAB — BRAIN NATRIURETIC PEPTIDE
Pro B Natriuretic peptide (BNP): 124 pg/mL — ABNORMAL HIGH (ref 0.0–100.0)
Pro B Natriuretic peptide (BNP): 264 pg/mL — ABNORMAL HIGH (ref 0.0–100.0)

## 2011-01-07 NOTE — Op Note (Signed)
NAME:  [name], [name]                           ACCOUNT NO.:  [id number]   MEDICAL RECORD NO.:  [id number]                   PATIENT TYPE:  AMB   LOCATION:  DAY                                  FACILITY:  [facility]   PHYSICIAN:  Lionel December, M.D.                 DATE OF BIRTH:  08/12/1920   DATE OF PROCEDURE:  DATE OF DISCHARGE:                                 OPERATIVE REPORT   PROCEDURE:  Total colonoscopy.   ENDOSCOPIST:  Lionel December, M.D.   INDICATIONS:  This patient is an 77 year old Caucasian female who was noted  to have heme-positive stools recently by Dr. Elana Alm.  She is undergoing  diagnostic colonoscopy. She denies melena, rectal bleeding or abdominal  pain.  The procedure and risks were reviewed with the patient and informed  consent was obtained.   PREOPERATIVE MEDICATIONS:  Demerol 40 mg IV and Versed 2.5 mg IV in divided  dose.   FINDINGS:  Procedure performed in endoscopy suite.  The patient's vital  signs and O2 saturation were monitored during the procedure and remained  stable.  The patient was placed in the left lateral recumbent position and  rectal examination was performed.  She was noted to have anal stenosis which  was felt to be noncritical and possibly related to her previous  hemorrhoidectomy x2.   The Olympus videoscope was placed in the rectum and advanced under vision  into the sigmoid colon where scattered diverticula were noted.  The scope  was carefully advanced through tortuous sigmoid colon.  Her left colon was  well prepped, but she had some stool on the right side.  Slowly and  carefully the scope was advanced to cecum which was identified by ileocecal  valve and appendiceal orifice/stump.  Pictures taken for the record.  As the  scope was withdrawn colonic mucosa was carefully examined and no polyps or  other abnormalities were noted.  Rectal mucosa was normal.  A scope was  retroflexed to examine the anorectal junction.  There was focal  erythema  right at the dentate line felt to be a trauma due to digital exam and scope.  No hemorrhoids were obvious.   The endoscope was straightened and withdrawn.  The patient tolerated the  procedure well.   FINAL DIAGNOSES:  1. Sigmoid colon diverticula.  2. Benign perianal stenosis felt to be secondary to previous     hemorrhoidectomies.   RECOMMENDATIONS:  High fiber diet and Citrucel or equivalent 1 tablespoonful  daily.  If she has tendency to constipation she could take Colace 1 to 2  tablets at bedtime.      ___________________________________________                                            Lionel December, M.D.  NR/MEDQ  D:  12/04/2003  T:  12/04/2003  Job:  [id number]   cc:   S. Kyra Manges, M.D.  [phone] [address]  [place]  [place] [address]  Fax: [phone]   Kirk Ruths, M.D.  [address]  [place]  [place] [address]  Fax: [phone]

## 2011-01-07 NOTE — Op Note (Signed)
   NAME:  [name], [name]                           ACCOUNT NO.:  [id number]   MEDICAL RECORD NO.:  [id number]                   PATIENT TYPE:  AMB   LOCATION:  DSC                                  FACILITY:  [facility]   PHYSICIAN:  Lowell Bouton, M.D.      DATE OF BIRTH:  04-23-1920   DATE OF PROCEDURE:  01/01/2003  DATE OF DISCHARGE:                                 OPERATIVE REPORT   PREOPERATIVE DIAGNOSIS:  Right carpal tunnel syndrome.   POSTOPERATIVE DIAGNOSIS:  Right carpal tunnel syndrome.   OPERATION PERFORMED:  Decompression of median nerve, right carpal tunnel.   SURGEON:  Lowell Bouton, M.D.   ANESTHESIA:  0.5% Marcaine local with sedation.   OPERATIVE FINDINGS:  The patient had evidence of compression of her median  node.  There were no masses present in the carpal canal and the motor branch  of the nerve was intact.   DESCRIPTION OF PROCEDURE:  Under 0.5% Marcaine local anesthesia with a  tourniquet on the right forearm, the right hand was prepped and draped in  the usual fashion and after exsanguinating the limb, the tourniquet was  inflated to 250 mmHg.  A 3 cm longitudinal incision was made in the palm  just ulnar to the thenar crease.  Sharp dissection was carried through the  subcutaneous tissues and bleeding points were coagulated.  Blunt dissection  was carried down to the transverse carpal ligament and it was divided on the  ulnar border of the median nerve sharply.  A hemostat was placed in the  carpal canal up against the hook of the hamate to determine the ulnar side  of the nerve.  The proximal end of the ligament was divided with the  scissors after dissecting the nerve away from the undersurface of the  ligament.  The nerve was examined and the external epineurium was released.  The motor branch was released into the muscle.  The wound was then irrigated  with saline.  The skin was closed with 4-0 nylon suture.  Sterile dressings  were  applied followed by a volar wrist splint.  The patient tolerated the  procedure well and went to the recovery room awake and stable in  good  condition.                                               Lowell Bouton, M.D.    EMM/MEDQ  D:  01/01/2003  T:  01/02/2003  Job:  [id number]   cc:   Patrica Duel, M.D.  [address]  [place]  [place] [address]  Fax: [phone]

## 2011-01-07 NOTE — Procedures (Signed)
NAMEJustina, Bertini Onya                 ACCOUNT NO.:  [id number]   MEDICAL RECORD NO.:  [id number]          PATIENT TYPE:  OUT   LOCATION:  RAD                           FACILITY:  [facility]   PHYSICIAN:  Vida Roller, M.D.   DATE OF BIRTH:  March 23, 1920   DATE OF PROCEDURE:  DATE OF DISCHARGE:                                    STRESS TEST   The patient's bra size is 36B.   BRIEF HISTORY:  The patient is an 77 year old Caucasian female who had a  thyroidectomy in 1948 and has been on thyroid replacement since that point.  She also has a past medical history significant for breast CA and status  post a left nephrectomy. The patient also has carpal tunnel surgery, DJD,  and hypertension and hypothyroidism. The patient was seen April 21, 2005  for some atypical chest pain and was decided to have exercise Myoview at  that point. Type of test the patient had today was exercise Myoview  performed by April Humphrey, nurse practitioner.   INDICATIONS:  Atypical chest pain.   Symptoms during the procedure were extreme fatigue. Arrhythmia:  None were  noted. Reason for stopping the test was patient achieved heart rate. Also,  the patient had extreme fatigue. Beginning, resting heart rate was 90.  Resting blood pressure was 172/90. Target heart rate was 116. Max predicted  heart rate was 137. Percent of max heart rate was 88. Max heart rate during  procedure was 120. Max systolic and diastolic pressure 182/72. The patient  exercised a total of 3 minutes and 13 seconds. METS achieved was 4.6. Worst  case ST slope was 0. Worst case ST level was -1.2. Some ST depression noted  during _______________ aVF during procedure.     ______________________________  April Humphrey, NP      Vida Roller, M.D.  Electronically Signed    AH/MEDQ  D:  05/04/2005  T:  05/04/2005  Job:  [id number]

## 2011-01-07 NOTE — H&P (Signed)
NAMEAreliz, Rothman Chantavia                 ACCOUNT NO.:  [id number]   MEDICAL RECORD NO.:  [id number]          PATIENT TYPE:  INP   LOCATION:  A315                          FACILITY:  [facility]   PHYSICIAN:  Corrie Mckusick, M.D.  DATE OF BIRTH:  1920/08/12   DATE OF ADMISSION:  09/11/2006  DATE OF DISCHARGE:  LH                              HISTORY & PHYSICAL   ADMITTING DIAGNOSIS:  Vertigo.   HISTORY OF PRESENT ILLNESS:  This is an 77 year old female with  hypertension, osteoporosis, osteoarthritis, diverticula, who presents  with 12-hours of significant vertigo.  She had no real prodromal  symptoms, just started to get quite dizzy with positions.  No previral  symptomatology.  She reports no headaches, vision changes, or other  symptomatology whatsoever.   REVIEW OF SYSTEMS:  CARDIOVASCULAR, RESPIRATORY, GASTROINTESTINAL,  GENITOURINARY review of systems are all negative.  This seems like  simply benign positional vertigo.   She came to the emergency department and was evaluated, all of which  looked great.  The family was very concerned about her going home as she  lived alone and she could not get up without getting significantly  dizzy.  No real bad nausea, however.  We decided to put her in as she  did live alone.   PAST MEDICAL HISTORY:  1. Hypertension.  2. Osteoporosis.  3. Osteoarthritis.  4. Diverticula.   PAST SURGICAL HISTORY:  1. Tonsillectomy.  2. Appendectomy.  3. Total hysterectomy.  4. Right carpal tunnel syndrome.  5. Left mastectomy for breast carcinoma.  6. Thyroidectomy in 1948 for hyperparathyroidism.  7. Hemorrhoidectomy.   FAMILY HISTORY:  1. Significant for Alzheimer's disease in her brother.  2. One sister also had colon carcinoma.   SOCIAL HISTORY:  She has two children, one of her sons lives in town.  She is widowed.  Never smoked.  No alcohol use.   MEDICATIONS ON ADMISSION:  1. Aspirin 81 mg daily.  2. Toprol-XL 25 mg p.o. b.i.d.  3. Synthroid  100 mcg daily.  4. Diovan 80 mg p.o. daily.  5. AcipHex 20 mg p.o. daily.   ALLERGIES:  No known drug allergies reported.   PHYSICAL EXAMINATION:  VITAL SIGNS:  Temp 97.6, pulse 62, respirations  20, blood pressure 120/76, O2 sat 96% on room air, weight 48.7 kg.  GENERAL:  When I saw her she was pleasant, talkative, joking, in no  acute distress.  HEENT:  Nose and oropharynx clear with mucous membranes moist.  TMs  negative bilaterally.  NECK:  No JVD.  No lymphadenopathy.  CHEST:  Clear to auscultation bilaterally.  CARDIOVASCULAR:  Regular rate and rhythm.  Normal S1 and S2.  No S3, S4,  murmurs, rubs, or gallops.  ABDOMEN:  Soft, nontender, nondistended.  EXTREMITIES:  No edema.  NEUROLOGIC:  Cranial nerves II through XII are intact.  Pupils equal and  reactive to light.  There is a positive Dix-Hallpike with movement.   LABORATORY STUDIES:  Please see flow sheet for details, overall  perfectly clear.  CT of the head was negative without contrast.   ASSESSMENT  AND PLAN:  An 77 year old female with hypertension,  osteoporosis, osteoarthritis who presents with benign positional  vertigo.   PLAN:  1. Admit for close monitoring as she lives alone and we will need to      get her up tomorrow and see how she moves around.  2. We will get case management involved in case we need to get some      home health for a short course until this resolves.  3. We will also cover her obviously with meclizine 25 mg p.o. every      six hours and we will continue her other home medications.      Corrie Mckusick, M.D.  Electronically Signed     JCG/MEDQ  D:  09/12/2006  T:  09/12/2006  Job:  [id number]

## 2011-01-07 NOTE — Discharge Summary (Signed)
NAMEMilayah, Krell Massey                 ACCOUNT NO.:  [id number]   MEDICAL RECORD NO.:  [id number]          PATIENT TYPE:  INP   LOCATION:  A315                          FACILITY:  [facility]   PHYSICIAN:  Corrie Mckusick, M.D.  DATE OF BIRTH:  1919-11-03   DATE OF ADMISSION:  09/11/2006  DATE OF DISCHARGE:  01/24/2008LH                               DISCHARGE SUMMARY   DISCHARGE DIAGNOSIS:  Benign positional vertigo.   HISTORY OF PRESENTING ILLNESS AND PAST MEDICAL HISTORY:  Please see  admission H&P.   HOSPITAL COURSE:  [name] with hypertension,  osteoporosis and osteoarthritis, who presented with benign positional  vertigo.  She was admitted for close monitoring, as she lives alone, and  for Antivert.  We got Physical Therapy involved to help her with  mobility as well.  Her dizziness seemed to improve slowly on a daily  basis.   On the 23rd, Dr. Regino Schultze saw the patient and ordered an MRI.  It showed  no acute findings, just showed some atrophy.  The MRA showed some mild  atherosclerotic changes.  It showed an ectasia, left vertebral artery  and basilar artery with mild narrowing, otherwise really a normal MRI  and MRA of the brain, which was great news.  Dr. Regino Schultze also added some  low-dose hydrochlorothiazide.  The MRI ruled out an acoustic neuroma as  well.  She is ready for discharge on the 24th.   DISCHARGE PHYSICAL EXAM:  VITAL SIGNS:  T-max of 98.6, blood pressure is  in the 120s over 60s, heart rate in the 60s.  GENERAL:  A pleasant  female, talkative, in no acute distress.  HEENT:  Nares and oropharynx  clear with moist mucous membranes.  NECK:  Supple.  CHEST:  Clear to  auscultation bilaterally.  CARDIOVASCULAR:  Regular rate and rhythm.  Normal S1 and S2.  No murmurs.  ABDOMEN:  Soft and nontender.  NEUROLOGICAL:  Cranial nerves II-XII intact.  Pupils are equal and  reactive to light. Extraocular muscles intact.   DISCHARGE MEDICATIONS:  Same as  admission with the addition of Antivert  25 mg p.o. q.i.d.   DISCHARGE CONDITION:  Improved and stable.   FOLLOWUP:  Will follow up in 1 week.  We will also set up for home  health and home physical therapy.      Corrie Mckusick, M.D.  Electronically Signed     JCG/MEDQ  D:  09/14/2006  T:  09/14/2006  Job:  [id number]

## 2011-01-07 NOTE — Procedures (Signed)
NAMEAnnia, [name]                 ACCOUNT NO.:  [id number]   MEDICAL RECORD NO.:  [id number]          PATIENT TYPE:  OUT   LOCATION:  RAD                           FACILITY:  [facility]   PHYSICIAN:  Vida Roller, M.D.   DATE OF BIRTH:  [birth year]   DATE OF PROCEDURE:  05/04/2005  DATE OF DISCHARGE:                                  ECHOCARDIOGRAM   PRIMARY CARE PHYSICIAN:  Patrica Duel, M.D.   TAPE NUMBER:  [id number]   TAPE COUNT:  [id number]   HISTORY OF PRESENT ILLNESS:  This is an 77 year old woman with chest  discomfort and hypertension.  Technical quality of this study is adequate.   M-MODE TRACINGS:  Aorta is 28 mm.   Left atrium 35 mm.   Septum 16 mm.   Posterior wall is 11 mm.   Left ventricular diastolic dimension 29 mm.   Left ventricular systolic dimension 20 mm.   A 2D AND DOPPLER IMAGING:  Left ventricle is normal sized with a mildly  decreased internal cavity.  There is a preserved LV systolic function with  an estimated ejection fraction of 60-65%.  No wall motion abnormalities,  mild left ventricular hypertrophy with some element of upper septal  hypertrophy.  No LV outflow tract obstruction is seen.   The right ventricle is normal size with normal sterile fashion.   Both atria appear to be normal size.   The aortic valve is sclerotic with no stenosis or regurgitation.   The mitral valve has mild annular calcification with trace insufficiency.  No stenosis is seen.   The tricuspid valve has trace insufficiency.  There is no pericardial  effusion.   The inferior vena cava and ascending aorta were not well seen.      Vida Roller, M.D.  Electronically Signed     JH/MEDQ  D:  05/04/2005  T:  05/05/2005  Job:  [id number]

## 2011-01-07 NOTE — Op Note (Signed)
NAME:  [name], [name]                 ACCOUNT NO.:  [id number]   MEDICAL RECORD NO.:  [id number]          PATIENT TYPE:  AMB   LOCATION:  DAY                           FACILITY:  [facility]   PHYSICIAN:  Lionel December, M.D.    DATE OF BIRTH:  [birth year]   DATE OF PROCEDURE:  09/25/2006  DATE OF DISCHARGE:                               OPERATIVE REPORT   PROCEDURE:  Esophagogastroduodenoscopy with esophageal dilation.   INDICATIONS:  Ms. Glasper is 77 year old Caucasian female with dysphagia  to solids as well as pills with barium study recently which suggests  narrowing at GE junction and impairment of esophageal motility.  Luminal  diameter at GE junction was estimated to be less than 12.5 mm and  therefore the radiologist did not give barium pill to the patient.   The patient's undergoing diagnostic/therapeutic procedure.  Procedure  risks were reviewed the patient, informed consent was obtained.   MEDS FOR CONSCIOUS SEDATION:  Benzocaine spray for pharyngeal topical  anesthesia Demerol 35 mg IV, Versed 2 mg IV.   FINDINGS:  Procedure performed in endoscopy suite.  The patient's vital  signs and O2 sat were monitored during procedure and remained stable.  Procedure was started with a regular Pentax videoscope.  Difficulty  passing the scope across the EUS.  Therefore was switched to pediatric  scope which was easily passed across the EUS into esophagus.   Esophagus.  Mucosa of the esophagus normal.  GE junction was  unremarkable at 38 cm from the incisors.  No ring or stricture was  apparent.  There was no hernia either.   Stomach.  It was empty and distended very well with insufflation.  Folds  of proximal stomach were normal.  Examination of mucosa revealed few  prominent erosions in the prepyloric area.  Pyloric channel was deformed  but patent.  Mucosa was friable.  Angularis, fundus and cardia examined  by retroflexing the scope and were normal.   Duodenum.  Bulbar mucosa was  normal.  Scope was passed second part of  duodenum where mucosa and folds were normal.  Endoscope was withdrawn.   Esophagus was dilated by passing 54-French to full insertion.  As the  dilator was withdrawn, endoscope was passed again.  There was a small  mucosal disruption noted at GE junction.  There was another boat shaped  mucosal tear at the cervical esophagus possibly indicative of web.  Pictures taken for the record.  Endoscope was withdrawn.  The patient  tolerated the procedure well.   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  Erosive antral gastritis with the pyloric channel inflammation.  These  changes of possibly due to NSAID use.   RECOMMENDATIONS:  H pylori serology will be checked today.   The patient will continue Aciphex at 20 mg p.o. q.a.m.   She will stay on soft diet for 2 days.      Lionel December, M.D.  Electronically Signed  NR/MEDQ  D:  09/25/2006  T:  09/25/2006  Job:  [id number]   cc:   Patrica Duel, M.D.  Fax: [phone]

## 2011-01-07 NOTE — Consult Note (Signed)
NAME:  Massey Massey                 ACCOUNT NO.:  [id number]   MEDICAL RECORD NO.:  [id number]          PATIENT TYPE:  AMB   LOCATION:  DAY                           FACILITY:  [facility]   PHYSICIAN:  Massey Massey, M.D.    DATE OF BIRTH:  21-Mar-1920   DATE OF CONSULTATION:  DATE OF DISCHARGE:                                 CONSULTATION   PRESENTING COMPLAINT:  Dysphagia.  Abnormal barium study suggesting  esophageal stricture.   HISTORY OF PRESENT ILLNESS:  Massey Massey is an 77 year old Caucasian female who  is referred through courtesy of Dr. Patrica Massey for evaluation of  dysphagia.  She states she has had dysphagia for years.  She remembers  she had difficulty swallowing mash potatoes over 10 years ago.  However,  it has been sporadic until a few months ago when she started to  experience this problem frequently, a couple times a week.  Not only is  she is having problem with solids but she is also having difficulty  swallowing her pills, which she had no difficulty before.  She denies  heartburn, chronic cough, sore throat; however, she feels her uvula is  always swollen which she believes is due to postnasal discharge.  She  states she has been chopping her meat into small pieces and seemed to be  doing better.  She feels food gets stuck lower sternal level.  She also  complains of food not digesting.  She states food may stay in her  stomach for a long time.  She is occasionally using Pepto-Bismol which  helped.  However, she denies pain, melena or rectal bleeding.  Her  bowels move regularly.  She states she has a good appetite.  Her weight  has been stable over the last year.  She had barium study by Dr.  Nobie Massey on June 09, 2006 which reveals diffuse narrowing of GE  junction suspicious for a stricture, prominent cricopharyngeus and  impaired esophageal motility.   She is on Toprol 25 mg b.i.d., Synthroid 75 mcg daily, ASA 81 mg daily,  Actonel 35 mg every week, Diovan 80 mg  daily, Centrum Silver daily,  Ocuvite daily, naproxen sodium 110 mg daily (she takes half a tablet),  Tylenol 250 mg daily, lutein one daily.   PAST MEDICAL HISTORY:  1. She has hypertension.  2. Osteoporosis.  3. Arthritis particularly of her back.  4. She had a colonoscopy because of heme-positive stools in April      2005.  5. She had sigmoid colon diverticula and noncritical benign anal      stenosis felt to be related to prior hemorrhoidectomies which she      has had two.  6. Other surgeries include thyroidectomy in 1948 for      hyperparathyroidism.  7. Status post left mastectomy for carcinoma in 1967.  8. Decompression of right carpal tunnel.  9. Tonsillectomy.  10.Appendectomy.  11.Hysterectomy.   FAMILY HISTORY:  One brother died of Alzheimer's in his early 72s.  He  was a physician.  Another brother is in fair health.  One sister died  of  colon carcinoma.  She was in her early 17s.   SOCIAL HISTORY:  She is widowed.  She has two children; one of her sons  lives in town.  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  She has never smoked cigarettes and may drink alcohol occasionally.   PHYSICAL EXAM:  Pleasant, well-developed, well-nourished Caucasian  female who is in no acute distress.  She weighs 119 pounds.  She is 5  feet 2 inches tall.  Pulse 56 per minute, blood pressure 130/68,  temperature is 97.9.  Conjunctivae is pink.  Sclerae is nonicteric.  Oropharyngeal mucosa is normal.  She has a prominent and long uvula.  Dentition in satisfactory condition.  No neck masses or thyromegaly  noted.  Cardiac exam with regular rhythm.  Normal S1-S2.  No murmur or  gallop noted.  Lungs are clear to auscultation.  Abdomen is flat and  soft without tenderness, organomegaly or masses.  Rectal examination  deferred.  She has changes of DJD involving distal interphalangeal  joints of both hands.  She does not have peripheral edema.   Upper GI series results as  above.   ASSESSMENT:  Massey Massey is an 77 year old Caucasian female who presents with  several year history of dysphagia which has been experienced on a  regular basis recently.  It is primarily to solids and pills.  She has  no difficulty with liquids and there is no history of frequent  heartburn, although lately she has had noted some postprandial fullness.  Barium study suggests esophageal dysmotility but more importantly a  distal esophageal stricture.  Barium study will be reviewed but per  report it appears to be significant and she was not given barium pill.   The patient is on low-dose aspirin and Naprosyn and therefore at risk  for peptic ulcer disease.  There is no prior history of Fosamax use.   RECOMMENDATIONS:  We will start on AcipHex 20 mg p.o. q.a.m., a three-  week supply given.   Esophagogastroduodenoscopy and possible esophageal dilation to be  performed later this month.  We will ask her to hold off her aspirin for  two or three days prior to this procedure.   We would appreciate the opportunity to participate in the care of this  nice lady.      Massey Massey, M.D.  Electronically Signed     NR/MEDQ  D:  08/24/2006  T:  08/24/2006  Job:  [id number]   cc:   Massey Massey, M.D.  Fax: [phone]

## 2011-01-07 NOTE — Consult Note (Signed)
NAME:  [name], [name]                             ACCOUNT NO.:  [id number]   MEDICAL RECORD NO.:  [id number]                  PATIENT TYPE:   LOCATION:                                       FACILITY:   PHYSICIAN:  [name], M.D.                 DATE OF BIRTH:   DATE OF CONSULTATION:  DATE OF DISCHARGE:                                   CONSULTATION   REASON FOR CONSULTATION:  Hemoccult positive stool.   HISTORY OF PRESENT ILLNESS:  [name] is an 77 year old Caucasian female  who presents to our office with a history of noting trace amount of bright  red blood on toilet paper post defecation.  She states she does have a  history of hemorrhoids which have had recent exacerbations.  She was seen by  [name].  During her rectal exam, she was found to have hemoccult  positive stool.  She does report occasional constipation which she describes  as going without a bowel movement for more than one day.  She uses prune  juice and this usually results in daily bowel movements.  She denies any  melena.  She denies any associated abdominal pain, although she does  complain of rectal burning and occasional pruritis as well.  She denies any  upper GI symptoms including nausea, vomiting, heartburn, indigestion,  dysphagia or odynophagia.  She denies any weight loss.  She denies any  family history of colorectal carcinoma.   CURRENT MEDICATIONS:  1. Xanax 0.5 mg, one-half tablet daily.  2. Toprol 50 mg, one-half tablet b.i.d.  3. Synthroid 75 micrograms daily.  4. Aspirin 81 mg daily.  5. Lutein daily.  6. Actonel 35 mg weekly.  7. Diovan 160 mg daily.  8. Diuril 25 mg one-half tablet q.o.d.  9. Tylenol 325 mg daily.   PAST MEDICAL HISTORY:  1. Hypertension.  2. Hyperthyroidism, status post thyroidectomy in 1948, currently on     Synthroid.  3. Fluid retention.  4. History of breast carcinoma, status post left radical mastectomy in 1967.  5. Right carpal tunnel release.  6.  Tonsillectomy.  7. Appendectomy in 1967.  8. Hysterectomy.  9. Hemorrhoidectomy x2.   FAMILY HISTORY:  Negative for colorectal carcinoma, liver problem or GI  problems.  Mother age 45, father in his 65's deceased secondary to coronary  artery disease and cerebrovascular accident respectively.  She has one  sister deceased of unknown etiology, and one brother alive and healthy.   SOCIAL HISTORY:  [name] lives alone.  She is a widow.  She has two  grown, healthy children.  She is a Futures trader.  She denies any tobacco or  drug use.  She does report an occasional glass of wine a couple of times a  week.   REVIEW OF SYSTEMS:  CONSTITUTION:  Weight is stable.  She denies any fever  or chills,  Appetite is  good.  GI:  See HPI.  CARDIOVASCULAR:  Denies any  chest pain, palpitations.  PULMONARY:  Denies any shortness of breath, cough  or dyspnea.  HEME:  Denies any blood dyscrasias or anemia.   PHYSICAL EXAMINATION:  VITAL SIGNS:  Weight 111.5 pounds, height 61 inches,  temperature 99.0, blood pressure 152/70, pulse 70.  GENERAL:  [name] is an 77 year old, thin, Caucasian female who is  alert, oriented, pleasant, cooperative and in no acute distress.  HEENT:  Sclerae are clear, nonicteric.  Conjunctivae pink.  Oropharynx pink  and moist without any lesions.  NECK:  Supple without any mass or thyromegaly.  CHEST:  Heart is regular rate and rhythm with normal S1 and S2 without any  murmurs, clicks, rubs or gallops.  LUNGS:  Clear to auscultation bilaterally.  ABDOMEN:  Flat with positive bowel sounds, no audible bruits.  Soft,  nontender, nondistended with no palpable mass or hepatosplenomegaly.  No  rebound tenderness.  RECTAL:  Exam reveals slight perirectal erythema and swelling with no  discrete masses, a few small hemorrhoids palpated, good sphincter tone, and  a small amount of light-brown hemoccult negative stool was obtained from the  vault.  Area is extremely tender on  palpation.  EXTREMITIES:  Good pulses bilaterally, no edema.  SKIN:  Pink, warm and dry without any rash or jaundice.   ASSESSMENT:  [name] is an 77 year old Caucasian female with findings  consistent with hemorrhoids, including hemoccult positive stool.  However,  given her age, I do agree with [name] and [name], that she should  undergo colonoscopy to rule out colorectal carcinoma.  She agrees with this  plan.   RECOMMENDATIONS:  1. We will scheduled colonoscopy to be performed by [name] in the near     future.  I have discussed this procedure including risks and benefits to     include but not limited to bleeding, infection, perforation and drug     reaction.  She agrees with this plan.  Consent will be obtained.  We will     ask her to hold her aspirin three days prior to procedure.  Also obtain a     CBC the day of the procedure.  2. We will treat hemorrhoids with Anusol HC, #21, per rectum b.i.d. x10     days, one refill.  3. I have given standard hemorrhoid precautions and hemorrhoid literature     for her review.  4. Further recommendations pending colonoscopy.   We would like to thank [name] for allowing [place] to participate in the  care of [name].     ________________________________________  ___________________________________________  [name], N.P.                  [name], M.D.   KC/MEDQ  D:  11/28/2003  T:  11/28/2003  Job:  [id number]   cc:   [name], M.D.  [address]  [place]  [place] [address]  Fax: [phone]   [name], M.D.  [phone] [address]  [place]  [place] [address]  Fax: [phone]

## 2011-01-25 ENCOUNTER — Telehealth: Payer: Self-pay

## 2011-01-25 NOTE — Telephone Encounter (Signed)
[name] said she got a note back that pt was not a pt here. Please see EMR Jan 2011. She had the Benefiber Sugar Free. She needs refills. Please advise!

## 2011-01-25 NOTE — Telephone Encounter (Signed)
Please have pharm fax over referenced request. Thanks

## 2011-01-26 ENCOUNTER — Telehealth: Payer: Self-pay | Admitting: Gastroenterology

## 2011-01-26 NOTE — Telephone Encounter (Signed)
Rx called to [facility].

## 2011-01-26 NOTE — Telephone Encounter (Signed)
See Genia Plants, NP note on 01/26/2011. Rx called to pharmacy.

## 2011-01-26 NOTE — Telephone Encounter (Signed)
May call in Benefiber sugar free powder, 1 month supply, take 2 teaspoonfuls twice daily. 5 refills.

## 2011-01-27 NOTE — Telephone Encounter (Signed)
Please see Itza Maniaci documentaion note dated 01/26/2011.

## 2011-04-27 ENCOUNTER — Other Ambulatory Visit: Payer: Self-pay | Admitting: Gastroenterology

## 2011-04-27 NOTE — Telephone Encounter (Signed)
May call in Benefiber sugar free. Take 2 teaspoonful bid. 30 day supply with 11 refills.

## 2011-04-28 NOTE — Telephone Encounter (Signed)
Called Benefiber Rx to [facility].

## 2011-09-30 ENCOUNTER — Emergency Department (HOSPITAL_COMMUNITY)
Admission: EM | Admit: 2011-09-30 | Discharge: 2011-09-30 | Disposition: A | Discharge status: Home / Self Care | Payer: Medicare Other | Attending: Emergency Medicine | Admitting: Emergency Medicine

## 2011-09-30 ENCOUNTER — Emergency Department (HOSPITAL_COMMUNITY): Payer: Medicare Other

## 2011-09-30 DIAGNOSIS — Z79899 Other long term (current) drug therapy: Secondary | ICD-10-CM | POA: Diagnosis not present

## 2011-09-30 DIAGNOSIS — Y92009 Unspecified place in unspecified non-institutional (private) residence as the place of occurrence of the external cause: Secondary | ICD-10-CM | POA: Insufficient documentation

## 2011-09-30 DIAGNOSIS — S298XXA Other specified injuries of thorax, initial encounter: Secondary | ICD-10-CM | POA: Diagnosis not present

## 2011-09-30 DIAGNOSIS — T148XXA Other injury of unspecified body region, initial encounter: Secondary | ICD-10-CM | POA: Diagnosis not present

## 2011-09-30 DIAGNOSIS — M25539 Pain in unspecified wrist: Secondary | ICD-10-CM | POA: Diagnosis not present

## 2011-09-30 DIAGNOSIS — S59909A Unspecified injury of unspecified elbow, initial encounter: Secondary | ICD-10-CM | POA: Diagnosis not present

## 2011-09-30 DIAGNOSIS — W2203XA Walked into furniture, initial encounter: Secondary | ICD-10-CM | POA: Insufficient documentation

## 2011-09-30 DIAGNOSIS — S62109A Fracture of unspecified carpal bone, unspecified wrist, initial encounter for closed fracture: Secondary | ICD-10-CM | POA: Diagnosis not present

## 2011-09-30 DIAGNOSIS — S52509A Unspecified fracture of the lower end of unspecified radius, initial encounter for closed fracture: Secondary | ICD-10-CM | POA: Diagnosis not present

## 2011-09-30 DIAGNOSIS — M25529 Pain in unspecified elbow: Secondary | ICD-10-CM | POA: Diagnosis not present

## 2011-09-30 DIAGNOSIS — M7989 Other specified soft tissue disorders: Secondary | ICD-10-CM | POA: Diagnosis not present

## 2011-09-30 DIAGNOSIS — S52609A Unspecified fracture of lower end of unspecified ulna, initial encounter for closed fracture: Secondary | ICD-10-CM | POA: Diagnosis not present

## 2011-09-30 MED ORDER — IBUPROFEN 400 MG PO TABS
600.0000 mg | ORAL_TABLET | Freq: Once | ORAL | Status: AC
  Administered 2011-09-30: 600 mg via ORAL
  Filled 2011-09-30: qty 2

## 2011-09-30 MED ORDER — MORPHINE SULFATE 2 MG/ML IJ SOLN
INTRAMUSCULAR | Status: AC
  Filled 2011-09-30: qty 1

## 2011-09-30 MED ORDER — HYDROCODONE-ACETAMINOPHEN 5-325 MG PO TABS: ORAL_TABLET | ORAL | Status: DC

## 2011-09-30 MED ORDER — MORPHINE SULFATE 2 MG/ML IJ SOLN
2.0000 mg | Freq: Once | INTRAMUSCULAR | Status: AC
  Administered 2011-09-30: 2 mg via INTRAMUSCULAR

## 2011-09-30 MED ORDER — ACETAMINOPHEN 325 MG PO TABS
650.0000 mg | ORAL_TABLET | Freq: Once | ORAL | Status: AC
  Administered 2011-09-30: 650 mg via ORAL
  Filled 2011-09-30: qty 2

## 2011-09-30 MED ORDER — OXYCODONE-ACETAMINOPHEN 5-325 MG PO TABS: 1.0000 | ORAL_TABLET | Freq: Once | ORAL | Status: DC

## 2011-09-30 NOTE — ED Provider Notes (Signed)
Pt well appearing Reports she fell in the night injuring her left wrist She denies head injury/headache Obvious fx noted to left wrist, distal neurovascularly intact to left UE Will call her orthopedist dr Hilda Lias Pt/family agreeable with plan  Joya Gaskins, MD 09/30/11 1024

## 2011-09-30 NOTE — ED Provider Notes (Signed)
History     CSN: [id number]  Arrival date & time 09/30/11  0845   First MD Initiated Contact with Patient 09/30/11 [phone]      Chief Complaint  Patient presents with  . Wrist Pain    (Consider location/radiation/quality/duration/timing/severity/associated sxs/prior treatment) Patient is a 78 y.o. female presenting with wrist pain. The history is provided by the patient and a relative.  Wrist Pain This is a new problem. The current episode started today (She reports stumbling into her bureau around 3 AM this morning when she got up to go to the bathroom. She states she hit her left forearm against a drawer.). The problem occurs constantly. The problem has been unchanged. Associated symptoms include arthralgias. Pertinent negatives include no abdominal pain, chest pain, congestion, fever, headaches, joint swelling, nausea, neck pain, numbness, rash, sore throat or weakness. Associated symptoms comments: She denies hitting her head, and denies headache or neck pain at this time.. The symptoms are aggravated by twisting and bending. Treatments tried: She was placed in a forearm splint prior to arrival by EMS. The treatment provided no relief.    No past medical history on file.  No past surgical history on file.  No family history on file.  History  Substance Use Topics  . Smoking status: Not on file  . Smokeless tobacco: Not on file  . Alcohol Use: Not on file    OB History    No data available      Review of Systems  Constitutional: Negative for fever.  HENT: Negative for congestion, sore throat and neck pain.   Eyes: Negative.   Respiratory: Negative for chest tightness and shortness of breath.   Cardiovascular: Negative for chest pain.  Gastrointestinal: Negative for nausea and abdominal pain.  Genitourinary: Negative.   Musculoskeletal: Positive for arthralgias. Negative for joint swelling.  Skin: Negative.  Negative for rash and wound.  Neurological: Negative for  dizziness, weakness, light-headedness, numbness and headaches.  Hematological: Negative.   Psychiatric/Behavioral: Negative.     Allergies  Cephalexin and GNF:AOZHYQMVHQI+ONGEXBMWU+XLKGMWNUUV acid+aspartame  Home Medications   Current Outpatient Rx  Name Route Sig Dispense Refill  . ACETAMINOPHEN 500 MG PO TABS Oral Take 1,000 mg by mouth at bedtime.    . ALPRAZOLAM 0.25 MG PO TABS Oral Take 0.25 mg by mouth 2 (two) times daily.    . ASPIRIN EC 81 MG PO TBEC Oral Take 81 mg by mouth daily.    Marland Kitchen BIOTIN 300 MCG PO TABS Oral Take 1 tablet by mouth daily.    Marland Kitchen CALCIUM CITRATE-VITAMIN D 200-250 MG-UNIT PO TABS Oral Take 1 tablet by mouth daily.    . DONEPEZIL HCL 10 MG PO TABS Oral Take 10 mg by mouth at bedtime.    Marland Kitchen ESCITALOPRAM OXALATE 10 MG PO TABS Oral Take 10 mg by mouth daily.    Marland Kitchen FERROUS SULFATE 325 (65 FE) MG PO TABS Oral Take 325 mg by mouth daily.    Marland Kitchen LEVOTHYROXINE SODIUM 125 MCG PO TABS Oral Take 125 mcg by mouth daily.    Marland Kitchen LOSARTAN POTASSIUM 50 MG PO TABS Oral Take 50 mg by mouth daily.    . LUTEIN 6 MG PO CAPS Oral Take 1 capsule by mouth daily.    Marland Kitchen MEMANTINE HCL 10 MG PO TABS Oral Take 10 mg by mouth 2 (two) times daily.    . MOMETASONE FUROATE 50 MCG/ACT NA SUSP Nasal Place 2 sprays into the nose daily as needed. allergies    .  DAILY VITE PO TABS Oral Take 1 tablet by mouth daily.    Marland Kitchen OMEPRAZOLE 20 MG PO CPDR Oral Take 20 mg by mouth daily.    . BENEFIBER PO POWD Oral Take 10 mLs by mouth 2 (two) times daily.    Marland Kitchen HYDROCODONE-ACETAMINOPHEN 5-325 MG PO TABS  Take 1/2 to 1 tablet every 4 hours if needed for pain. 20 tablet 0    BP 142/82  Pulse 73  Temp(Src) 98.2 F (36.8 C) (Oral)  Resp 20  SpO2 97%  Physical Exam  Nursing note and vitals reviewed. Constitutional: She is oriented to person, place, and time. She appears well-developed and well-nourished.  HENT:  Head: Normocephalic and atraumatic.  Eyes: Conjunctivae are normal.  Neck: Normal range of motion.   Cardiovascular: Normal rate, regular rhythm, normal heart sounds and intact distal pulses.   Pulses:      Radial pulses are 2+ on the right side, and 2+ on the left side.  Pulmonary/Chest: Effort normal and breath sounds normal. She has no wheezes.  Abdominal: Soft. Bowel sounds are normal. There is no tenderness.  Musculoskeletal: She exhibits edema and tenderness.       Left forearm: She exhibits tenderness, swelling and edema.       Arms: Neurological: She is alert and oriented to person, place, and time.  Skin: Skin is warm and dry. Bruising noted. No rash noted.     Psychiatric: She has a normal mood and affect.    ED Course  Procedures (including critical care time)  Labs Reviewed - No data to display Dg Ribs Unilateral W/chest Left  09/30/2011  *RADIOLOGY REPORT*  Clinical Data: Fall, left arm pain  LEFT RIBS AND CHEST - 3+ VIEW  Comparison: Chest radiograph twelfth 06/2009  Findings: Normal cardiac silhouette.  Lungs hyperinflated. Dedicated views of the left ribs demonstrate no displaced fracture. No pneumothorax is present.  There is irregularity of the left humeral head.  Legrand Rams this to represent advanced arthropathy of the glenohumeral joint which is increased compared to prior.  IMPRESSION:  1.  No clear acute thoracic trauma. 2.  No evidence of rib fracture or pneumothorax. 3.  Irregularity of the left humeral head.  Favor erosive arthropathy which is advanced compared to prior.   If acute shoulder pain recommend dedicated left shoulder views.  Original Report Authenticated By: Genevive Bi, M.D.   Dg Elbow Complete Left  09/30/2011  *RADIOLOGY REPORT*  Clinical Data: Left elbow pain  LEFT ELBOW - COMPLETE 3+ VIEW  Comparison: None.  Findings: No evidence of fracture or dislocation of the left elbow. No joint effusion.  IMPRESSION: No fracture.  Original Report Authenticated By: Genevive Bi, M.D.   Dg Wrist Complete Left  09/30/2011  *RADIOLOGY REPORT*  Clinical Data:  Wrist pain, fall  LEFT WRIST - COMPLETE 3+ VIEW  Comparison: None.  Findings: Impaction fractures of the distal radius and ulna.  There is mild dorsal angulation of the radial fracture.  Radiocarpal joint is intact.  There is significant soft tissue swelling of the dorsal aspect of the wrist.  No evidence of carpal fracture. Diffuse osteopenia.  IMPRESSION: Fractures of the distal radius and ulna.  Original Report Authenticated By: Genevive Bi, M.D.     1. Wrist fracture, closed       MDM  Sugar tong splint and sling applied, as instructed by Dr. Hilda Lias, after her Dr. Bebe Shaggy spoke with Dr. Hilda Lias regarding this patient. Patient will followup closely with Dr. Hilda Lias for further  management of this injury. Prescribed hydrocodone, one half to one tablet every 4 hours when necessary pain. Ice and elevation. Instructions given regarding symptoms which should prompt return here, including numbness, increased pain, swelling or color changes to hand and fingers distal to injury.       Candis Musa, PA 09/30/11 1128

## 2011-09-30 NOTE — ED Notes (Signed)
Pt reports woke up this am around 3:00, jumped out of bed in a hurry to go to the bathroom and bumped her left wrist on piece of furniture.  Deformity noted to left wrist. Bruising present.

## 2011-09-30 NOTE — ED Notes (Signed)
Pt states she bumped into her dresser this morning. Complain of pain in left wrist. Splint applied to left wrist forearm

## 2011-09-30 NOTE — ED Notes (Signed)
Pt eating crackers and drinking coke without difficulty.  APPlied sugar tong splint to left arm.  Pulse present, skin warm to touch, capillary refill wnl.  APplie foam arm sling.

## 2011-09-30 NOTE — ED Provider Notes (Signed)
Medical screening examination/treatment/procedure(s) were conducted as a shared visit with non-physician practitioner(s) and myself.  I personally evaluated the patient during the encounter    Joya Gaskins, MD 09/30/11 [phone]

## 2011-10-03 DIAGNOSIS — S52509A Unspecified fracture of the lower end of unspecified radius, initial encounter for closed fracture: Secondary | ICD-10-CM | POA: Diagnosis not present

## 2011-10-03 DIAGNOSIS — D649 Anemia, unspecified: Secondary | ICD-10-CM | POA: Diagnosis not present

## 2011-10-03 DIAGNOSIS — R279 Unspecified lack of coordination: Secondary | ICD-10-CM | POA: Diagnosis not present

## 2011-10-03 DIAGNOSIS — I10 Essential (primary) hypertension: Secondary | ICD-10-CM | POA: Diagnosis not present

## 2011-10-03 DIAGNOSIS — Z9181 History of falling: Secondary | ICD-10-CM | POA: Diagnosis not present

## 2011-10-03 DIAGNOSIS — S5290XD Unspecified fracture of unspecified forearm, subsequent encounter for closed fracture with routine healing: Secondary | ICD-10-CM | POA: Diagnosis not present

## 2011-10-05 DIAGNOSIS — D649 Anemia, unspecified: Secondary | ICD-10-CM | POA: Diagnosis not present

## 2011-10-05 DIAGNOSIS — Z9181 History of falling: Secondary | ICD-10-CM | POA: Diagnosis not present

## 2011-10-05 DIAGNOSIS — R279 Unspecified lack of coordination: Secondary | ICD-10-CM | POA: Diagnosis not present

## 2011-10-05 DIAGNOSIS — S5290XD Unspecified fracture of unspecified forearm, subsequent encounter for closed fracture with routine healing: Secondary | ICD-10-CM | POA: Diagnosis not present

## 2011-10-05 DIAGNOSIS — I10 Essential (primary) hypertension: Secondary | ICD-10-CM | POA: Diagnosis not present

## 2011-10-06 DIAGNOSIS — Z9181 History of falling: Secondary | ICD-10-CM | POA: Diagnosis not present

## 2011-10-06 DIAGNOSIS — D649 Anemia, unspecified: Secondary | ICD-10-CM | POA: Diagnosis not present

## 2011-10-06 DIAGNOSIS — I10 Essential (primary) hypertension: Secondary | ICD-10-CM | POA: Diagnosis not present

## 2011-10-06 DIAGNOSIS — S5290XD Unspecified fracture of unspecified forearm, subsequent encounter for closed fracture with routine healing: Secondary | ICD-10-CM | POA: Diagnosis not present

## 2011-10-06 DIAGNOSIS — R279 Unspecified lack of coordination: Secondary | ICD-10-CM | POA: Diagnosis not present

## 2011-10-07 DIAGNOSIS — Z9181 History of falling: Secondary | ICD-10-CM | POA: Diagnosis not present

## 2011-10-07 DIAGNOSIS — I10 Essential (primary) hypertension: Secondary | ICD-10-CM | POA: Diagnosis not present

## 2011-10-07 DIAGNOSIS — R279 Unspecified lack of coordination: Secondary | ICD-10-CM | POA: Diagnosis not present

## 2011-10-07 DIAGNOSIS — S5290XD Unspecified fracture of unspecified forearm, subsequent encounter for closed fracture with routine healing: Secondary | ICD-10-CM | POA: Diagnosis not present

## 2011-10-07 DIAGNOSIS — D649 Anemia, unspecified: Secondary | ICD-10-CM | POA: Diagnosis not present

## 2011-10-10 DIAGNOSIS — I10 Essential (primary) hypertension: Secondary | ICD-10-CM | POA: Diagnosis not present

## 2011-10-10 DIAGNOSIS — Z9181 History of falling: Secondary | ICD-10-CM | POA: Diagnosis not present

## 2011-10-10 DIAGNOSIS — D649 Anemia, unspecified: Secondary | ICD-10-CM | POA: Diagnosis not present

## 2011-10-10 DIAGNOSIS — S5290XD Unspecified fracture of unspecified forearm, subsequent encounter for closed fracture with routine healing: Secondary | ICD-10-CM | POA: Diagnosis not present

## 2011-10-10 DIAGNOSIS — R279 Unspecified lack of coordination: Secondary | ICD-10-CM | POA: Diagnosis not present

## 2011-10-11 DIAGNOSIS — M25519 Pain in unspecified shoulder: Secondary | ICD-10-CM | POA: Diagnosis not present

## 2011-10-11 DIAGNOSIS — I10 Essential (primary) hypertension: Secondary | ICD-10-CM | POA: Diagnosis not present

## 2011-10-11 DIAGNOSIS — R279 Unspecified lack of coordination: Secondary | ICD-10-CM | POA: Diagnosis not present

## 2011-10-11 DIAGNOSIS — D649 Anemia, unspecified: Secondary | ICD-10-CM | POA: Diagnosis not present

## 2011-10-11 DIAGNOSIS — S5290XD Unspecified fracture of unspecified forearm, subsequent encounter for closed fracture with routine healing: Secondary | ICD-10-CM | POA: Diagnosis not present

## 2011-10-11 DIAGNOSIS — S63509A Unspecified sprain of unspecified wrist, initial encounter: Secondary | ICD-10-CM | POA: Diagnosis not present

## 2011-10-11 DIAGNOSIS — Z9181 History of falling: Secondary | ICD-10-CM | POA: Diagnosis not present

## 2011-10-12 DIAGNOSIS — D649 Anemia, unspecified: Secondary | ICD-10-CM | POA: Diagnosis not present

## 2011-10-12 DIAGNOSIS — Z9181 History of falling: Secondary | ICD-10-CM | POA: Diagnosis not present

## 2011-10-12 DIAGNOSIS — R279 Unspecified lack of coordination: Secondary | ICD-10-CM | POA: Diagnosis not present

## 2011-10-12 DIAGNOSIS — I10 Essential (primary) hypertension: Secondary | ICD-10-CM | POA: Diagnosis not present

## 2011-10-12 DIAGNOSIS — S5290XD Unspecified fracture of unspecified forearm, subsequent encounter for closed fracture with routine healing: Secondary | ICD-10-CM | POA: Diagnosis not present

## 2011-10-13 DIAGNOSIS — R279 Unspecified lack of coordination: Secondary | ICD-10-CM | POA: Diagnosis not present

## 2011-10-13 DIAGNOSIS — S5290XD Unspecified fracture of unspecified forearm, subsequent encounter for closed fracture with routine healing: Secondary | ICD-10-CM | POA: Diagnosis not present

## 2011-10-13 DIAGNOSIS — D649 Anemia, unspecified: Secondary | ICD-10-CM | POA: Diagnosis not present

## 2011-10-13 DIAGNOSIS — Z9181 History of falling: Secondary | ICD-10-CM | POA: Diagnosis not present

## 2011-10-13 DIAGNOSIS — I10 Essential (primary) hypertension: Secondary | ICD-10-CM | POA: Diagnosis not present

## 2011-10-17 ENCOUNTER — Encounter (HOSPITAL_COMMUNITY): Payer: Self-pay | Admitting: *Deleted

## 2011-10-17 ENCOUNTER — Emergency Department (HOSPITAL_COMMUNITY)
Admission: EM | Admit: 2011-10-17 | Discharge: 2011-10-17 | Disposition: A | Discharge status: Home / Self Care | Payer: Medicare Other | Attending: Emergency Medicine | Admitting: Emergency Medicine

## 2011-10-17 ENCOUNTER — Emergency Department (HOSPITAL_COMMUNITY): Payer: Medicare Other

## 2011-10-17 DIAGNOSIS — R531 Weakness: Secondary | ICD-10-CM

## 2011-10-17 DIAGNOSIS — F0781 Postconcussional syndrome: Secondary | ICD-10-CM | POA: Diagnosis not present

## 2011-10-17 DIAGNOSIS — R5381 Other malaise: Secondary | ICD-10-CM | POA: Insufficient documentation

## 2011-10-17 DIAGNOSIS — G319 Degenerative disease of nervous system, unspecified: Secondary | ICD-10-CM | POA: Insufficient documentation

## 2011-10-17 DIAGNOSIS — Z7982 Long term (current) use of aspirin: Secondary | ICD-10-CM | POA: Diagnosis not present

## 2011-10-17 DIAGNOSIS — I6789 Other cerebrovascular disease: Secondary | ICD-10-CM | POA: Diagnosis not present

## 2011-10-17 DIAGNOSIS — E079 Disorder of thyroid, unspecified: Secondary | ICD-10-CM | POA: Diagnosis not present

## 2011-10-17 DIAGNOSIS — I10 Essential (primary) hypertension: Secondary | ICD-10-CM | POA: Insufficient documentation

## 2011-10-17 DIAGNOSIS — R5383 Other fatigue: Secondary | ICD-10-CM | POA: Insufficient documentation

## 2011-10-17 DIAGNOSIS — F0390 Unspecified dementia without behavioral disturbance: Secondary | ICD-10-CM | POA: Diagnosis not present

## 2011-10-17 DIAGNOSIS — R4182 Altered mental status, unspecified: Secondary | ICD-10-CM | POA: Insufficient documentation

## 2011-10-17 HISTORY — DX: Unspecified dementia, unspecified severity, without behavioral disturbance, psychotic disturbance, mood disturbance, and anxiety: F03.90

## 2011-10-17 HISTORY — DX: Essential (primary) hypertension: I10

## 2011-10-17 LAB — BASIC METABOLIC PANEL
BUN: 26 mg/dL — ABNORMAL HIGH (ref 6–23)
CO2: 29 mEq/L (ref 19–32)
Calcium: 9.9 mg/dL (ref 8.4–10.5)
Glucose, Bld: 97 mg/dL (ref 70–99)
Sodium: 133 mEq/L — ABNORMAL LOW (ref 135–145)

## 2011-10-17 LAB — CBC
HCT: 33.5 % — ABNORMAL LOW (ref 36.0–46.0)
Hemoglobin: 11.5 g/dL — ABNORMAL LOW (ref 12.0–15.0)
MCH: 32 pg (ref 26.0–34.0)
MCV: 93.3 fL (ref 78.0–100.0)
RBC: 3.59 MIL/uL — ABNORMAL LOW (ref 3.87–5.11)

## 2011-10-17 LAB — URINALYSIS, ROUTINE W REFLEX MICROSCOPIC
Bilirubin Urine: NEGATIVE
Glucose, UA: NEGATIVE mg/dL
Hgb urine dipstick: NEGATIVE
Ketones, ur: NEGATIVE mg/dL
Specific Gravity, Urine: 1.02 (ref 1.005–1.030)
pH: 5.5 (ref 5.0–8.0)

## 2011-10-17 NOTE — ED Notes (Signed)
2 weeks ago had bone reset due to fx from a fall, family states with all over weakness and confusion and eye sight much worse, needs assistance with ambulating, at [facility], high risk for fall per family, left arm noted with cast in place

## 2011-10-17 NOTE — ED Notes (Signed)
Family at bedside. Patient does not need anything at this time. 

## 2011-10-17 NOTE — ED Provider Notes (Signed)
History     CSN: [id number]  Arrival date & time 10/17/11  1019   None     Chief Complaint  Patient presents with  . Altered Mental Status    (Consider location/radiation/quality/duration/timing/severity/associated sxs/prior treatment) HPI This 78 year old female lives at assisted living at the [facility] and has required more assistance of the last 2 weeks since she fell and broke her left wrist. Her left arm is in a cast and she is followed by Dr. Hilda Lias for that. At baseline she has some confusion from her dementia but over the last 2 weeks since her fall and fractured wrist she has more confusion and more generalized weakness requiring assistance with gait where she was able to walk independently prior to her fall 2 weeks ago. She is no headache or neck pain she has no back pain chest pain cough shortness breath abdominal pain vomiting diarrhea bloody stools or lateralizing weakness or numbness or incoordination. She is just generally weak her and more confused than usual over the last 2 weeks. She also has a slight change in her right eye vision is the fall as well. At baseline she has poor vision in the right eye with very poor central vision from macular degeneration with no sudden blindness or peripheral visual field change it's just that she seems to have a little more of a blurred central visual field since her fall as well. There is no change in vision to her left eye and there is no pain to the right eye. The family states the patient walk independently into Dr. Sanjuan Dame office and as soon as the wrist was reduced the patient has been unable to walk without assistance since that time.  Her confusion has waxed and waned over the last 2 weeks but is worse than it was prior to her fall and her generalized weakness has been persistent and unchanged over the last 2 weeks. Past Medical History  Diagnosis Date  . Thyroid disease   . Hypertension   . Dementia     Past Surgical  History  Procedure Date  . Thyroid surgery   . Mastectomy   . Carpal tunnel release   . Abdominal hysterectomy     History reviewed. No pertinent family history.  History  Substance Use Topics  . Smoking status: Never Smoker   . Smokeless tobacco: Not on file  . Alcohol Use: Yes     very rare    OB History    Grav Para Term Preterm Abortions TAB SAB Ect Mult Living                  Review of Systems  Constitutional: Negative for fever.       10 Systems reviewed and are negative for acute change except as noted in the HPI.  HENT: Negative for congestion.   Eyes: Negative for discharge and redness.  Respiratory: Negative for cough and shortness of breath.   Cardiovascular: Negative for chest pain.  Gastrointestinal: Negative for vomiting and abdominal pain.  Musculoskeletal: Negative for back pain.  Skin: Negative for rash.  Neurological: Positive for weakness. Negative for syncope, facial asymmetry, speech difficulty, light-headedness, numbness and headaches.  Psychiatric/Behavioral:       No behavior change.    Allergies  Cephalexin and ZHY:QMVHQIONGEX+BMWUXLKGM+WNUUVOZDGU acid+aspartame  Home Medications   Current Outpatient Rx  Name Route Sig Dispense Refill  . ACETAMINOPHEN 500 MG PO TABS Oral Take 1,000 mg by mouth at bedtime.    . ALPRAZOLAM  0.25 MG PO TABS Oral Take 0.25 mg by mouth 2 (two) times daily.    . ASPIRIN EC 81 MG PO TBEC Oral Take 81 mg by mouth daily.    Marland Kitchen BIOTIN 300 MCG PO TABS Oral Take 1 tablet by mouth daily.    Marland Kitchen CALCIUM CITRATE-VITAMIN D 200-250 MG-UNIT PO TABS Oral Take 2 tablets by mouth 2 (two) times daily.     . DONEPEZIL HCL 10 MG PO TABS Oral Take 10 mg by mouth daily.     Marland Kitchen ESCITALOPRAM OXALATE 10 MG PO TABS Oral Take 10 mg by mouth daily.    Marland Kitchen FERROUS SULFATE 325 (65 FE) MG PO TABS Oral Take 325 mg by mouth daily.    Marland Kitchen LEVOTHYROXINE SODIUM 125 MCG PO TABS Oral Take 125 mcg by mouth daily.    Marland Kitchen LOSARTAN POTASSIUM 50 MG PO TABS  Oral Take 50 mg by mouth daily.    . LUTEIN 6 MG PO CAPS Oral Take 1 capsule by mouth daily.    Marland Kitchen MEMANTINE HCL 10 MG PO TABS Oral Take 10 mg by mouth 2 (two) times daily.    Marland Kitchen DAILY VITE PO TABS Oral Take 1 tablet by mouth daily.    Marland Kitchen OMEPRAZOLE 20 MG PO CPDR Oral Take 20 mg by mouth daily.    Marland Kitchen VITAMIN E 400 UNITS PO CAPS Oral Take 400 Units by mouth daily.    . BENEFIBER PO POWD Oral Take 10 mLs by mouth 2 (two) times daily.      BP 138/64  Pulse 81  Temp(Src) 97.9 F (36.6 C) (Oral)  Resp 16  Ht 5\' 1"  (1.549 m)  Wt 120 lb (54.432 kg)  BMI 22.67 kg/m2  SpO2 95%  Physical Exam  Nursing note and vitals reviewed. Constitutional: She is oriented to person, place, and time.       Awake, alert, nontoxic appearance with baseline speech for patient.  HENT:  Head: Atraumatic.  Mouth/Throat: No oropharyngeal exudate.  Eyes: EOM are normal. Pupils are equal, round, and reactive to light. Right eye exhibits no discharge. Left eye exhibits no discharge.  Neck: Neck supple.  Cardiovascular: Normal rate and regular rhythm.   No murmur heard. Pulmonary/Chest: Effort normal and breath sounds normal. No stridor. No respiratory distress. She has no wheezes. She has no rales. She exhibits no tenderness.  Abdominal: Soft. Bowel sounds are normal. She exhibits no mass. There is no tenderness. There is no rebound.  Musculoskeletal: She exhibits no tenderness.       Baseline ROM, moves extremities with no obvious new focal weakness. Left arm is in a long-arm cast.  Lymphadenopathy:    She has no cervical adenopathy.  Neurological: She is alert and oriented to person, place, and time.       Awake, alert, cooperative and aware of situation currently oriented to person place and time; motor strength bilaterally symmetric; sensation normal to light touch bilaterally; peripheral visual fields full to confrontation; no facial asymmetry; tongue midline; major cranial nerves appear intact; no pronator drift,  normal finger to nose with right hand, gait is slow and shuffling and needs assistance for any standing or walking  Skin: No rash noted.  Psychiatric: She has a normal mood and affect.    ED Course  Procedures (including critical care time)  Labs Reviewed  CBC - Abnormal; Notable for the following:    RBC 3.59 (*)    Hemoglobin 11.5 (*)    HCT 33.5 (*)  All other components within normal limits  BASIC METABOLIC PANEL - Abnormal; Notable for the following:    Sodium 133 (*)    BUN 26 (*)    Creatinine, Ser 1.36 (*)    GFR calc non Af Amer 33 (*)    GFR calc Af Amer 39 (*)    All other components within normal limits  URINALYSIS, ROUTINE W REFLEX MICROSCOPIC  LAB REPORT - SCANNED   Ct Head Wo Contrast  10/17/2011  *RADIOLOGY REPORT*  Clinical Data: Altered mental status, dementia  CT HEAD WITHOUT CONTRAST  Technique:  Contiguous axial images were obtained from the base of the skull through the vertex without contrast.  Comparison: 03/06/2010  Findings: No evidence of parenchymal hemorrhage or extra-axial fluid collection. No mass lesion, mass effect, or midline shift.  No CT evidence of acute infarction.  Subcortical white matter and periventricular small vessel ischemic changes.  Global cortical atrophy.  No ventriculomegaly.  The visualized paranasal sinuses are essentially clear. The mastoid air cells are unopacified.  No evidence of calvarial fracture.  IMPRESSION: No evidence of acute intracranial abnormality.  Atrophy with small vessel ischemic changes.  Original Report Authenticated By: Charline Bills, M.D.     1. Weakness generalized   2. Dementia   3. Postconcussive syndrome       MDM  Patient / Family / Caregiver informed of clinical course, understand medical decision-making process, and agree with plan.Pt stable in ED with no significant deterioration in condition. I doubt any other EMC precluding discharge at this time including, but not necessarily limited to the  following:ICH, CVA, ACS, SBI.       Hurman Horn, MD 10/18/11 [phone]

## 2011-10-17 NOTE — ED Notes (Signed)
Pt returned from ct. Nad. No change.  

## 2011-10-17 NOTE — Discharge Instructions (Signed)
Your caregiver has seen you today because you are having problems with feelings of weakness, dizziness, and/or fatigue. Weakness has many different causes, some of which are common and others are very rare. Your caregiver has considered some of the most common causes of weakness and feels it is safe for you to go home and be observed. Not every illness or injury can be identified during an emergency department visit, thus follow-up with your primary healthcare provider is important. Medical conditions can also worsen, so it is also important to return immediately as directed below, or if you have other serious concerns develop. RETURN IMMEDIATELY IF you develop new shortness of breath, chest pain, fever, have difficulty moving parts of your body (new weakness, numbness, or incoordination), sudden change in speech, vision, swallowing, or understanding, faint or develop new dizziness, severe headache, become poorly responsive or have an altered mental status compared to baseline for you, new rash, abdominal pain, or bloody stools,  Return sooner also if you develop new problems for which you have not talked to your caregiver but you feel may be emergency medical conditions, or are unable to be cared for safely at home.  You have had a head injury which does not appear to require admission at this time. A concussion is a state of changed mental ability from trauma. SEEK IMMEDIATE MEDICAL ATTENTION IF: There is confusion or drowsiness (although children frequently become drowsy after injury).  You cannot awaken the injured person.  There is nausea (feeling sick to your stomach) or continued, forceful vomiting.  You notice dizziness or unsteadiness which is getting worse, or inability to walk.  You have convulsions or unconsciousness.  You experience severe, persistent headaches not relieved by Tylenol?. (Do not take aspirin as this impairs clotting abilities). Take other pain medications only as directed.    You cannot use arms or legs normally.  There are changes in pupil sizes. (This is the black center in the colored part of the eye)  There is clear or bloody discharge from the nose or ears.  Change in speech, vision, swallowing, or understanding.  Localized weakness, numbness, tingling, or change in bowel or bladder control. 

## 2011-10-18 DIAGNOSIS — D649 Anemia, unspecified: Secondary | ICD-10-CM | POA: Diagnosis not present

## 2011-10-18 DIAGNOSIS — S5290XD Unspecified fracture of unspecified forearm, subsequent encounter for closed fracture with routine healing: Secondary | ICD-10-CM | POA: Diagnosis not present

## 2011-10-18 DIAGNOSIS — R279 Unspecified lack of coordination: Secondary | ICD-10-CM | POA: Diagnosis not present

## 2011-10-18 DIAGNOSIS — Z9181 History of falling: Secondary | ICD-10-CM | POA: Diagnosis not present

## 2011-10-18 DIAGNOSIS — I10 Essential (primary) hypertension: Secondary | ICD-10-CM | POA: Diagnosis not present

## 2011-10-19 DIAGNOSIS — R279 Unspecified lack of coordination: Secondary | ICD-10-CM | POA: Diagnosis not present

## 2011-10-19 DIAGNOSIS — I10 Essential (primary) hypertension: Secondary | ICD-10-CM | POA: Diagnosis not present

## 2011-10-19 DIAGNOSIS — S5290XD Unspecified fracture of unspecified forearm, subsequent encounter for closed fracture with routine healing: Secondary | ICD-10-CM | POA: Diagnosis not present

## 2011-10-19 DIAGNOSIS — D649 Anemia, unspecified: Secondary | ICD-10-CM | POA: Diagnosis not present

## 2011-10-19 DIAGNOSIS — Z9181 History of falling: Secondary | ICD-10-CM | POA: Diagnosis not present

## 2011-10-20 DIAGNOSIS — I10 Essential (primary) hypertension: Secondary | ICD-10-CM | POA: Diagnosis not present

## 2011-10-20 DIAGNOSIS — R279 Unspecified lack of coordination: Secondary | ICD-10-CM | POA: Diagnosis not present

## 2011-10-20 DIAGNOSIS — Z9181 History of falling: Secondary | ICD-10-CM | POA: Diagnosis not present

## 2011-10-20 DIAGNOSIS — D649 Anemia, unspecified: Secondary | ICD-10-CM | POA: Diagnosis not present

## 2011-10-20 DIAGNOSIS — S5290XD Unspecified fracture of unspecified forearm, subsequent encounter for closed fracture with routine healing: Secondary | ICD-10-CM | POA: Diagnosis not present

## 2011-10-21 DIAGNOSIS — S52123A Displaced fracture of head of unspecified radius, initial encounter for closed fracture: Secondary | ICD-10-CM | POA: Diagnosis not present

## 2011-10-21 DIAGNOSIS — S52509A Unspecified fracture of the lower end of unspecified radius, initial encounter for closed fracture: Secondary | ICD-10-CM | POA: Diagnosis not present

## 2011-10-24 DIAGNOSIS — D649 Anemia, unspecified: Secondary | ICD-10-CM | POA: Diagnosis not present

## 2011-10-24 DIAGNOSIS — S5290XD Unspecified fracture of unspecified forearm, subsequent encounter for closed fracture with routine healing: Secondary | ICD-10-CM | POA: Diagnosis not present

## 2011-10-24 DIAGNOSIS — Z9181 History of falling: Secondary | ICD-10-CM | POA: Diagnosis not present

## 2011-10-24 DIAGNOSIS — I10 Essential (primary) hypertension: Secondary | ICD-10-CM | POA: Diagnosis not present

## 2011-10-24 DIAGNOSIS — R279 Unspecified lack of coordination: Secondary | ICD-10-CM | POA: Diagnosis not present

## 2011-10-25 DIAGNOSIS — R279 Unspecified lack of coordination: Secondary | ICD-10-CM | POA: Diagnosis not present

## 2011-10-25 DIAGNOSIS — S5290XD Unspecified fracture of unspecified forearm, subsequent encounter for closed fracture with routine healing: Secondary | ICD-10-CM | POA: Diagnosis not present

## 2011-10-25 DIAGNOSIS — I10 Essential (primary) hypertension: Secondary | ICD-10-CM | POA: Diagnosis not present

## 2011-10-25 DIAGNOSIS — Z9181 History of falling: Secondary | ICD-10-CM | POA: Diagnosis not present

## 2011-10-25 DIAGNOSIS — D649 Anemia, unspecified: Secondary | ICD-10-CM | POA: Diagnosis not present

## 2011-10-27 DIAGNOSIS — R279 Unspecified lack of coordination: Secondary | ICD-10-CM | POA: Diagnosis not present

## 2011-10-27 DIAGNOSIS — R4182 Altered mental status, unspecified: Secondary | ICD-10-CM | POA: Diagnosis not present

## 2011-10-27 DIAGNOSIS — IMO0002 Reserved for concepts with insufficient information to code with codable children: Secondary | ICD-10-CM | POA: Diagnosis not present

## 2011-10-27 DIAGNOSIS — S5290XD Unspecified fracture of unspecified forearm, subsequent encounter for closed fracture with routine healing: Secondary | ICD-10-CM | POA: Diagnosis not present

## 2011-10-27 DIAGNOSIS — D649 Anemia, unspecified: Secondary | ICD-10-CM | POA: Diagnosis not present

## 2011-10-27 DIAGNOSIS — Z9181 History of falling: Secondary | ICD-10-CM | POA: Diagnosis not present

## 2011-10-27 DIAGNOSIS — I10 Essential (primary) hypertension: Secondary | ICD-10-CM | POA: Diagnosis not present

## 2011-10-28 DIAGNOSIS — Z9181 History of falling: Secondary | ICD-10-CM | POA: Diagnosis not present

## 2011-10-28 DIAGNOSIS — I10 Essential (primary) hypertension: Secondary | ICD-10-CM | POA: Diagnosis not present

## 2011-10-28 DIAGNOSIS — D649 Anemia, unspecified: Secondary | ICD-10-CM | POA: Diagnosis not present

## 2011-10-28 DIAGNOSIS — S5290XD Unspecified fracture of unspecified forearm, subsequent encounter for closed fracture with routine healing: Secondary | ICD-10-CM | POA: Diagnosis not present

## 2011-10-28 DIAGNOSIS — R279 Unspecified lack of coordination: Secondary | ICD-10-CM | POA: Diagnosis not present

## 2011-11-04 DIAGNOSIS — S52123A Displaced fracture of head of unspecified radius, initial encounter for closed fracture: Secondary | ICD-10-CM | POA: Diagnosis not present

## 2011-11-04 DIAGNOSIS — S52509A Unspecified fracture of the lower end of unspecified radius, initial encounter for closed fracture: Secondary | ICD-10-CM | POA: Diagnosis not present

## 2011-11-04 DIAGNOSIS — S52609A Unspecified fracture of lower end of unspecified ulna, initial encounter for closed fracture: Secondary | ICD-10-CM | POA: Diagnosis not present

## 2011-11-07 DIAGNOSIS — I10 Essential (primary) hypertension: Secondary | ICD-10-CM | POA: Diagnosis not present

## 2011-11-07 DIAGNOSIS — S5290XD Unspecified fracture of unspecified forearm, subsequent encounter for closed fracture with routine healing: Secondary | ICD-10-CM | POA: Diagnosis not present

## 2011-11-07 DIAGNOSIS — R279 Unspecified lack of coordination: Secondary | ICD-10-CM | POA: Diagnosis not present

## 2011-11-07 DIAGNOSIS — Z9181 History of falling: Secondary | ICD-10-CM | POA: Diagnosis not present

## 2011-11-07 DIAGNOSIS — D649 Anemia, unspecified: Secondary | ICD-10-CM | POA: Diagnosis not present

## 2011-11-08 DIAGNOSIS — I10 Essential (primary) hypertension: Secondary | ICD-10-CM | POA: Diagnosis not present

## 2011-11-08 DIAGNOSIS — S5290XD Unspecified fracture of unspecified forearm, subsequent encounter for closed fracture with routine healing: Secondary | ICD-10-CM | POA: Diagnosis not present

## 2011-11-08 DIAGNOSIS — Z9181 History of falling: Secondary | ICD-10-CM | POA: Diagnosis not present

## 2011-11-08 DIAGNOSIS — R279 Unspecified lack of coordination: Secondary | ICD-10-CM | POA: Diagnosis not present

## 2011-11-08 DIAGNOSIS — D649 Anemia, unspecified: Secondary | ICD-10-CM | POA: Diagnosis not present

## 2011-11-10 DIAGNOSIS — I10 Essential (primary) hypertension: Secondary | ICD-10-CM | POA: Diagnosis not present

## 2011-11-10 DIAGNOSIS — H353190 Nonexudative age-related macular degeneration, unspecified eye, stage unspecified: Secondary | ICD-10-CM | POA: Diagnosis not present

## 2011-11-10 DIAGNOSIS — S5290XD Unspecified fracture of unspecified forearm, subsequent encounter for closed fracture with routine healing: Secondary | ICD-10-CM | POA: Diagnosis not present

## 2011-11-10 DIAGNOSIS — H43819 Vitreous degeneration, unspecified eye: Secondary | ICD-10-CM | POA: Diagnosis not present

## 2011-11-10 DIAGNOSIS — D649 Anemia, unspecified: Secondary | ICD-10-CM | POA: Diagnosis not present

## 2011-11-10 DIAGNOSIS — R279 Unspecified lack of coordination: Secondary | ICD-10-CM | POA: Diagnosis not present

## 2011-11-10 DIAGNOSIS — Z9181 History of falling: Secondary | ICD-10-CM | POA: Diagnosis not present

## 2011-11-10 DIAGNOSIS — H353290 Exudative age-related macular degeneration, unspecified eye, stage unspecified: Secondary | ICD-10-CM | POA: Diagnosis not present

## 2011-11-11 DIAGNOSIS — R279 Unspecified lack of coordination: Secondary | ICD-10-CM | POA: Diagnosis not present

## 2011-11-11 DIAGNOSIS — I10 Essential (primary) hypertension: Secondary | ICD-10-CM | POA: Diagnosis not present

## 2011-11-11 DIAGNOSIS — Z9181 History of falling: Secondary | ICD-10-CM | POA: Diagnosis not present

## 2011-11-11 DIAGNOSIS — D649 Anemia, unspecified: Secondary | ICD-10-CM | POA: Diagnosis not present

## 2011-11-11 DIAGNOSIS — S5290XD Unspecified fracture of unspecified forearm, subsequent encounter for closed fracture with routine healing: Secondary | ICD-10-CM | POA: Diagnosis not present

## 2011-11-14 DIAGNOSIS — I10 Essential (primary) hypertension: Secondary | ICD-10-CM | POA: Diagnosis not present

## 2011-11-14 DIAGNOSIS — S5290XD Unspecified fracture of unspecified forearm, subsequent encounter for closed fracture with routine healing: Secondary | ICD-10-CM | POA: Diagnosis not present

## 2011-11-14 DIAGNOSIS — D649 Anemia, unspecified: Secondary | ICD-10-CM | POA: Diagnosis not present

## 2011-11-14 DIAGNOSIS — Z9181 History of falling: Secondary | ICD-10-CM | POA: Diagnosis not present

## 2011-11-14 DIAGNOSIS — R279 Unspecified lack of coordination: Secondary | ICD-10-CM | POA: Diagnosis not present

## 2011-11-15 ENCOUNTER — Other Ambulatory Visit: Payer: Self-pay

## 2011-11-15 MED ORDER — OMEPRAZOLE 20 MG PO CPDR: 20.0000 mg | DELAYED_RELEASE_CAPSULE | Freq: Every day | ORAL | Status: DC

## 2011-11-16 DIAGNOSIS — I10 Essential (primary) hypertension: Secondary | ICD-10-CM | POA: Diagnosis not present

## 2011-11-16 DIAGNOSIS — S5290XD Unspecified fracture of unspecified forearm, subsequent encounter for closed fracture with routine healing: Secondary | ICD-10-CM | POA: Diagnosis not present

## 2011-11-16 DIAGNOSIS — Z9181 History of falling: Secondary | ICD-10-CM | POA: Diagnosis not present

## 2011-11-16 DIAGNOSIS — D649 Anemia, unspecified: Secondary | ICD-10-CM | POA: Diagnosis not present

## 2011-11-16 DIAGNOSIS — R279 Unspecified lack of coordination: Secondary | ICD-10-CM | POA: Diagnosis not present

## 2011-11-17 DIAGNOSIS — S52609A Unspecified fracture of lower end of unspecified ulna, initial encounter for closed fracture: Secondary | ICD-10-CM | POA: Diagnosis not present

## 2011-11-17 DIAGNOSIS — S52509A Unspecified fracture of the lower end of unspecified radius, initial encounter for closed fracture: Secondary | ICD-10-CM | POA: Diagnosis not present

## 2011-11-21 DIAGNOSIS — I10 Essential (primary) hypertension: Secondary | ICD-10-CM | POA: Diagnosis not present

## 2011-11-21 DIAGNOSIS — F0390 Unspecified dementia without behavioral disturbance: Secondary | ICD-10-CM | POA: Diagnosis not present

## 2011-11-21 DIAGNOSIS — M25519 Pain in unspecified shoulder: Secondary | ICD-10-CM | POA: Diagnosis not present

## 2011-11-21 DIAGNOSIS — R488 Other symbolic dysfunctions: Secondary | ICD-10-CM | POA: Diagnosis not present

## 2011-11-21 DIAGNOSIS — M6281 Muscle weakness (generalized): Secondary | ICD-10-CM | POA: Diagnosis not present

## 2011-11-21 DIAGNOSIS — R609 Edema, unspecified: Secondary | ICD-10-CM | POA: Diagnosis not present

## 2011-11-21 DIAGNOSIS — R279 Unspecified lack of coordination: Secondary | ICD-10-CM | POA: Diagnosis not present

## 2011-11-21 DIAGNOSIS — S62109A Fracture of unspecified carpal bone, unspecified wrist, initial encounter for closed fracture: Secondary | ICD-10-CM | POA: Diagnosis not present

## 2011-11-21 DIAGNOSIS — R262 Difficulty in walking, not elsewhere classified: Secondary | ICD-10-CM | POA: Diagnosis not present

## 2011-12-21 DIAGNOSIS — R262 Difficulty in walking, not elsewhere classified: Secondary | ICD-10-CM | POA: Diagnosis not present

## 2011-12-21 DIAGNOSIS — S62109A Fracture of unspecified carpal bone, unspecified wrist, initial encounter for closed fracture: Secondary | ICD-10-CM | POA: Diagnosis not present

## 2011-12-21 DIAGNOSIS — M6281 Muscle weakness (generalized): Secondary | ICD-10-CM | POA: Diagnosis not present

## 2011-12-21 DIAGNOSIS — M25519 Pain in unspecified shoulder: Secondary | ICD-10-CM | POA: Diagnosis not present

## 2011-12-21 DIAGNOSIS — R279 Unspecified lack of coordination: Secondary | ICD-10-CM | POA: Diagnosis not present

## 2011-12-21 DIAGNOSIS — I10 Essential (primary) hypertension: Secondary | ICD-10-CM | POA: Diagnosis not present

## 2011-12-21 DIAGNOSIS — F0390 Unspecified dementia without behavioral disturbance: Secondary | ICD-10-CM | POA: Diagnosis not present

## 2011-12-21 DIAGNOSIS — R488 Other symbolic dysfunctions: Secondary | ICD-10-CM | POA: Diagnosis not present

## 2011-12-21 DIAGNOSIS — R609 Edema, unspecified: Secondary | ICD-10-CM | POA: Diagnosis not present

## 2012-01-20 DIAGNOSIS — E039 Hypothyroidism, unspecified: Secondary | ICD-10-CM | POA: Diagnosis not present

## 2012-01-20 DIAGNOSIS — E785 Hyperlipidemia, unspecified: Secondary | ICD-10-CM | POA: Diagnosis not present

## 2012-01-20 DIAGNOSIS — E119 Type 2 diabetes mellitus without complications: Secondary | ICD-10-CM | POA: Diagnosis not present

## 2012-01-20 DIAGNOSIS — IMO0002 Reserved for concepts with insufficient information to code with codable children: Secondary | ICD-10-CM | POA: Diagnosis not present

## 2012-01-20 DIAGNOSIS — I10 Essential (primary) hypertension: Secondary | ICD-10-CM | POA: Diagnosis not present

## 2012-01-24 DIAGNOSIS — I10 Essential (primary) hypertension: Secondary | ICD-10-CM | POA: Diagnosis not present

## 2012-01-24 DIAGNOSIS — M6281 Muscle weakness (generalized): Secondary | ICD-10-CM | POA: Diagnosis not present

## 2012-01-24 DIAGNOSIS — M25519 Pain in unspecified shoulder: Secondary | ICD-10-CM | POA: Diagnosis not present

## 2012-01-24 DIAGNOSIS — S52509A Unspecified fracture of the lower end of unspecified radius, initial encounter for closed fracture: Secondary | ICD-10-CM | POA: Diagnosis not present

## 2012-01-24 DIAGNOSIS — S62109A Fracture of unspecified carpal bone, unspecified wrist, initial encounter for closed fracture: Secondary | ICD-10-CM | POA: Diagnosis not present

## 2012-01-24 DIAGNOSIS — S52609A Unspecified fracture of lower end of unspecified ulna, initial encounter for closed fracture: Secondary | ICD-10-CM | POA: Diagnosis not present

## 2012-01-24 DIAGNOSIS — R279 Unspecified lack of coordination: Secondary | ICD-10-CM | POA: Diagnosis not present

## 2012-01-24 DIAGNOSIS — R262 Difficulty in walking, not elsewhere classified: Secondary | ICD-10-CM | POA: Diagnosis not present

## 2012-01-24 DIAGNOSIS — F0390 Unspecified dementia without behavioral disturbance: Secondary | ICD-10-CM | POA: Diagnosis not present

## 2012-01-24 DIAGNOSIS — R609 Edema, unspecified: Secondary | ICD-10-CM | POA: Diagnosis not present

## 2012-01-24 DIAGNOSIS — R488 Other symbolic dysfunctions: Secondary | ICD-10-CM | POA: Diagnosis not present

## 2012-01-26 DIAGNOSIS — M25519 Pain in unspecified shoulder: Secondary | ICD-10-CM | POA: Diagnosis not present

## 2012-01-26 DIAGNOSIS — R279 Unspecified lack of coordination: Secondary | ICD-10-CM | POA: Diagnosis not present

## 2012-01-26 DIAGNOSIS — R262 Difficulty in walking, not elsewhere classified: Secondary | ICD-10-CM | POA: Diagnosis not present

## 2012-01-26 DIAGNOSIS — R609 Edema, unspecified: Secondary | ICD-10-CM | POA: Diagnosis not present

## 2012-01-26 DIAGNOSIS — M6281 Muscle weakness (generalized): Secondary | ICD-10-CM | POA: Diagnosis not present

## 2012-01-26 DIAGNOSIS — S62109A Fracture of unspecified carpal bone, unspecified wrist, initial encounter for closed fracture: Secondary | ICD-10-CM | POA: Diagnosis not present

## 2012-01-27 DIAGNOSIS — R279 Unspecified lack of coordination: Secondary | ICD-10-CM | POA: Diagnosis not present

## 2012-01-27 DIAGNOSIS — S62109A Fracture of unspecified carpal bone, unspecified wrist, initial encounter for closed fracture: Secondary | ICD-10-CM | POA: Diagnosis not present

## 2012-01-27 DIAGNOSIS — R609 Edema, unspecified: Secondary | ICD-10-CM | POA: Diagnosis not present

## 2012-01-27 DIAGNOSIS — R262 Difficulty in walking, not elsewhere classified: Secondary | ICD-10-CM | POA: Diagnosis not present

## 2012-01-27 DIAGNOSIS — M6281 Muscle weakness (generalized): Secondary | ICD-10-CM | POA: Diagnosis not present

## 2012-01-27 DIAGNOSIS — M25519 Pain in unspecified shoulder: Secondary | ICD-10-CM | POA: Diagnosis not present

## 2012-01-30 DIAGNOSIS — M6281 Muscle weakness (generalized): Secondary | ICD-10-CM | POA: Diagnosis not present

## 2012-01-30 DIAGNOSIS — M25519 Pain in unspecified shoulder: Secondary | ICD-10-CM | POA: Diagnosis not present

## 2012-01-30 DIAGNOSIS — R609 Edema, unspecified: Secondary | ICD-10-CM | POA: Diagnosis not present

## 2012-01-30 DIAGNOSIS — R262 Difficulty in walking, not elsewhere classified: Secondary | ICD-10-CM | POA: Diagnosis not present

## 2012-01-30 DIAGNOSIS — S62109A Fracture of unspecified carpal bone, unspecified wrist, initial encounter for closed fracture: Secondary | ICD-10-CM | POA: Diagnosis not present

## 2012-01-30 DIAGNOSIS — R279 Unspecified lack of coordination: Secondary | ICD-10-CM | POA: Diagnosis not present

## 2012-01-31 DIAGNOSIS — M25519 Pain in unspecified shoulder: Secondary | ICD-10-CM | POA: Diagnosis not present

## 2012-01-31 DIAGNOSIS — R262 Difficulty in walking, not elsewhere classified: Secondary | ICD-10-CM | POA: Diagnosis not present

## 2012-01-31 DIAGNOSIS — R279 Unspecified lack of coordination: Secondary | ICD-10-CM | POA: Diagnosis not present

## 2012-01-31 DIAGNOSIS — S62109A Fracture of unspecified carpal bone, unspecified wrist, initial encounter for closed fracture: Secondary | ICD-10-CM | POA: Diagnosis not present

## 2012-01-31 DIAGNOSIS — R609 Edema, unspecified: Secondary | ICD-10-CM | POA: Diagnosis not present

## 2012-01-31 DIAGNOSIS — M6281 Muscle weakness (generalized): Secondary | ICD-10-CM | POA: Diagnosis not present

## 2012-02-01 DIAGNOSIS — R279 Unspecified lack of coordination: Secondary | ICD-10-CM | POA: Diagnosis not present

## 2012-02-01 DIAGNOSIS — M25519 Pain in unspecified shoulder: Secondary | ICD-10-CM | POA: Diagnosis not present

## 2012-02-01 DIAGNOSIS — R262 Difficulty in walking, not elsewhere classified: Secondary | ICD-10-CM | POA: Diagnosis not present

## 2012-02-01 DIAGNOSIS — R609 Edema, unspecified: Secondary | ICD-10-CM | POA: Diagnosis not present

## 2012-02-01 DIAGNOSIS — S62109A Fracture of unspecified carpal bone, unspecified wrist, initial encounter for closed fracture: Secondary | ICD-10-CM | POA: Diagnosis not present

## 2012-02-01 DIAGNOSIS — M6281 Muscle weakness (generalized): Secondary | ICD-10-CM | POA: Diagnosis not present

## 2012-02-09 DIAGNOSIS — R609 Edema, unspecified: Secondary | ICD-10-CM | POA: Diagnosis not present

## 2012-02-09 DIAGNOSIS — R279 Unspecified lack of coordination: Secondary | ICD-10-CM | POA: Diagnosis not present

## 2012-02-09 DIAGNOSIS — S62109A Fracture of unspecified carpal bone, unspecified wrist, initial encounter for closed fracture: Secondary | ICD-10-CM | POA: Diagnosis not present

## 2012-02-09 DIAGNOSIS — M25519 Pain in unspecified shoulder: Secondary | ICD-10-CM | POA: Diagnosis not present

## 2012-02-09 DIAGNOSIS — M6281 Muscle weakness (generalized): Secondary | ICD-10-CM | POA: Diagnosis not present

## 2012-02-09 DIAGNOSIS — R262 Difficulty in walking, not elsewhere classified: Secondary | ICD-10-CM | POA: Diagnosis not present

## 2012-02-10 DIAGNOSIS — M25519 Pain in unspecified shoulder: Secondary | ICD-10-CM | POA: Diagnosis not present

## 2012-02-10 DIAGNOSIS — R262 Difficulty in walking, not elsewhere classified: Secondary | ICD-10-CM | POA: Diagnosis not present

## 2012-02-10 DIAGNOSIS — S62109A Fracture of unspecified carpal bone, unspecified wrist, initial encounter for closed fracture: Secondary | ICD-10-CM | POA: Diagnosis not present

## 2012-02-10 DIAGNOSIS — R279 Unspecified lack of coordination: Secondary | ICD-10-CM | POA: Diagnosis not present

## 2012-02-10 DIAGNOSIS — M6281 Muscle weakness (generalized): Secondary | ICD-10-CM | POA: Diagnosis not present

## 2012-02-10 DIAGNOSIS — R609 Edema, unspecified: Secondary | ICD-10-CM | POA: Diagnosis not present

## 2012-02-13 DIAGNOSIS — R609 Edema, unspecified: Secondary | ICD-10-CM | POA: Diagnosis not present

## 2012-02-13 DIAGNOSIS — M25519 Pain in unspecified shoulder: Secondary | ICD-10-CM | POA: Diagnosis not present

## 2012-02-13 DIAGNOSIS — S62109A Fracture of unspecified carpal bone, unspecified wrist, initial encounter for closed fracture: Secondary | ICD-10-CM | POA: Diagnosis not present

## 2012-02-13 DIAGNOSIS — M6281 Muscle weakness (generalized): Secondary | ICD-10-CM | POA: Diagnosis not present

## 2012-02-13 DIAGNOSIS — R262 Difficulty in walking, not elsewhere classified: Secondary | ICD-10-CM | POA: Diagnosis not present

## 2012-02-13 DIAGNOSIS — R279 Unspecified lack of coordination: Secondary | ICD-10-CM | POA: Diagnosis not present

## 2012-02-14 DIAGNOSIS — M25519 Pain in unspecified shoulder: Secondary | ICD-10-CM | POA: Diagnosis not present

## 2012-02-14 DIAGNOSIS — M6281 Muscle weakness (generalized): Secondary | ICD-10-CM | POA: Diagnosis not present

## 2012-02-14 DIAGNOSIS — S62109A Fracture of unspecified carpal bone, unspecified wrist, initial encounter for closed fracture: Secondary | ICD-10-CM | POA: Diagnosis not present

## 2012-02-14 DIAGNOSIS — R262 Difficulty in walking, not elsewhere classified: Secondary | ICD-10-CM | POA: Diagnosis not present

## 2012-02-14 DIAGNOSIS — R279 Unspecified lack of coordination: Secondary | ICD-10-CM | POA: Diagnosis not present

## 2012-02-14 DIAGNOSIS — R609 Edema, unspecified: Secondary | ICD-10-CM | POA: Diagnosis not present

## 2012-02-17 DIAGNOSIS — S62109A Fracture of unspecified carpal bone, unspecified wrist, initial encounter for closed fracture: Secondary | ICD-10-CM | POA: Diagnosis not present

## 2012-02-17 DIAGNOSIS — M25519 Pain in unspecified shoulder: Secondary | ICD-10-CM | POA: Diagnosis not present

## 2012-02-17 DIAGNOSIS — R262 Difficulty in walking, not elsewhere classified: Secondary | ICD-10-CM | POA: Diagnosis not present

## 2012-02-17 DIAGNOSIS — R279 Unspecified lack of coordination: Secondary | ICD-10-CM | POA: Diagnosis not present

## 2012-02-17 DIAGNOSIS — M6281 Muscle weakness (generalized): Secondary | ICD-10-CM | POA: Diagnosis not present

## 2012-02-17 DIAGNOSIS — R609 Edema, unspecified: Secondary | ICD-10-CM | POA: Diagnosis not present

## 2012-02-20 DIAGNOSIS — M6281 Muscle weakness (generalized): Secondary | ICD-10-CM | POA: Diagnosis not present

## 2012-02-20 DIAGNOSIS — M25519 Pain in unspecified shoulder: Secondary | ICD-10-CM | POA: Diagnosis not present

## 2012-02-20 DIAGNOSIS — S62109A Fracture of unspecified carpal bone, unspecified wrist, initial encounter for closed fracture: Secondary | ICD-10-CM | POA: Diagnosis not present

## 2012-02-20 DIAGNOSIS — R279 Unspecified lack of coordination: Secondary | ICD-10-CM | POA: Diagnosis not present

## 2012-02-20 DIAGNOSIS — R262 Difficulty in walking, not elsewhere classified: Secondary | ICD-10-CM | POA: Diagnosis not present

## 2012-02-20 DIAGNOSIS — R609 Edema, unspecified: Secondary | ICD-10-CM | POA: Diagnosis not present

## 2012-02-21 DIAGNOSIS — R279 Unspecified lack of coordination: Secondary | ICD-10-CM | POA: Diagnosis not present

## 2012-02-21 DIAGNOSIS — M25519 Pain in unspecified shoulder: Secondary | ICD-10-CM | POA: Diagnosis not present

## 2012-02-21 DIAGNOSIS — S52609A Unspecified fracture of lower end of unspecified ulna, initial encounter for closed fracture: Secondary | ICD-10-CM | POA: Diagnosis not present

## 2012-02-21 DIAGNOSIS — R262 Difficulty in walking, not elsewhere classified: Secondary | ICD-10-CM | POA: Diagnosis not present

## 2012-02-21 DIAGNOSIS — S52509A Unspecified fracture of the lower end of unspecified radius, initial encounter for closed fracture: Secondary | ICD-10-CM | POA: Diagnosis not present

## 2012-02-21 DIAGNOSIS — R609 Edema, unspecified: Secondary | ICD-10-CM | POA: Diagnosis not present

## 2012-02-21 DIAGNOSIS — S62109A Fracture of unspecified carpal bone, unspecified wrist, initial encounter for closed fracture: Secondary | ICD-10-CM | POA: Diagnosis not present

## 2012-02-21 DIAGNOSIS — M6281 Muscle weakness (generalized): Secondary | ICD-10-CM | POA: Diagnosis not present

## 2012-02-22 DIAGNOSIS — M6281 Muscle weakness (generalized): Secondary | ICD-10-CM | POA: Diagnosis not present

## 2012-02-22 DIAGNOSIS — M25519 Pain in unspecified shoulder: Secondary | ICD-10-CM | POA: Diagnosis not present

## 2012-02-22 DIAGNOSIS — S62109A Fracture of unspecified carpal bone, unspecified wrist, initial encounter for closed fracture: Secondary | ICD-10-CM | POA: Diagnosis not present

## 2012-02-22 DIAGNOSIS — R279 Unspecified lack of coordination: Secondary | ICD-10-CM | POA: Diagnosis not present

## 2012-02-22 DIAGNOSIS — R609 Edema, unspecified: Secondary | ICD-10-CM | POA: Diagnosis not present

## 2012-02-22 DIAGNOSIS — R262 Difficulty in walking, not elsewhere classified: Secondary | ICD-10-CM | POA: Diagnosis not present

## 2012-02-27 DIAGNOSIS — R609 Edema, unspecified: Secondary | ICD-10-CM | POA: Diagnosis not present

## 2012-02-27 DIAGNOSIS — M25519 Pain in unspecified shoulder: Secondary | ICD-10-CM | POA: Diagnosis not present

## 2012-02-27 DIAGNOSIS — R262 Difficulty in walking, not elsewhere classified: Secondary | ICD-10-CM | POA: Diagnosis not present

## 2012-02-27 DIAGNOSIS — R279 Unspecified lack of coordination: Secondary | ICD-10-CM | POA: Diagnosis not present

## 2012-02-27 DIAGNOSIS — M6281 Muscle weakness (generalized): Secondary | ICD-10-CM | POA: Diagnosis not present

## 2012-02-27 DIAGNOSIS — S62109A Fracture of unspecified carpal bone, unspecified wrist, initial encounter for closed fracture: Secondary | ICD-10-CM | POA: Diagnosis not present

## 2012-02-28 DIAGNOSIS — R609 Edema, unspecified: Secondary | ICD-10-CM | POA: Diagnosis not present

## 2012-02-28 DIAGNOSIS — R262 Difficulty in walking, not elsewhere classified: Secondary | ICD-10-CM | POA: Diagnosis not present

## 2012-02-28 DIAGNOSIS — R279 Unspecified lack of coordination: Secondary | ICD-10-CM | POA: Diagnosis not present

## 2012-02-28 DIAGNOSIS — S62109A Fracture of unspecified carpal bone, unspecified wrist, initial encounter for closed fracture: Secondary | ICD-10-CM | POA: Diagnosis not present

## 2012-02-28 DIAGNOSIS — M6281 Muscle weakness (generalized): Secondary | ICD-10-CM | POA: Diagnosis not present

## 2012-02-28 DIAGNOSIS — M25519 Pain in unspecified shoulder: Secondary | ICD-10-CM | POA: Diagnosis not present

## 2012-05-08 DIAGNOSIS — H04129 Dry eye syndrome of unspecified lacrimal gland: Secondary | ICD-10-CM | POA: Diagnosis not present

## 2012-05-08 DIAGNOSIS — H353190 Nonexudative age-related macular degeneration, unspecified eye, stage unspecified: Secondary | ICD-10-CM | POA: Diagnosis not present

## 2012-05-08 DIAGNOSIS — H353290 Exudative age-related macular degeneration, unspecified eye, stage unspecified: Secondary | ICD-10-CM | POA: Diagnosis not present

## 2012-05-13 ENCOUNTER — Encounter (HOSPITAL_COMMUNITY): Payer: Self-pay | Admitting: Anesthesiology

## 2012-05-13 ENCOUNTER — Inpatient Hospital Stay (HOSPITAL_COMMUNITY)
Admission: AD | Admit: 2012-05-13 | Discharge: 2012-05-16 | DRG: 956 | Disposition: A | Discharge status: Skilled Nursing Facility | Payer: Medicare Other | Attending: Internal Medicine | Admitting: Internal Medicine

## 2012-05-13 ENCOUNTER — Encounter (HOSPITAL_COMMUNITY): Payer: Self-pay

## 2012-05-13 ENCOUNTER — Inpatient Hospital Stay (HOSPITAL_COMMUNITY): Payer: Medicare Other

## 2012-05-13 ENCOUNTER — Emergency Department (HOSPITAL_COMMUNITY): Payer: Medicare Other

## 2012-05-13 ENCOUNTER — Encounter (HOSPITAL_COMMUNITY)
Admission: AD | Disposition: A | Discharge status: Skilled Nursing Facility | Payer: Self-pay | Source: Home / Self Care | Attending: Internal Medicine

## 2012-05-13 ENCOUNTER — Inpatient Hospital Stay (HOSPITAL_COMMUNITY): Payer: Medicare Other | Admitting: Anesthesiology

## 2012-05-13 DIAGNOSIS — R197 Diarrhea, unspecified: Secondary | ICD-10-CM

## 2012-05-13 DIAGNOSIS — Z5189 Encounter for other specified aftercare: Secondary | ICD-10-CM | POA: Diagnosis not present

## 2012-05-13 DIAGNOSIS — M810 Age-related osteoporosis without current pathological fracture: Secondary | ICD-10-CM

## 2012-05-13 DIAGNOSIS — S72009D Fracture of unspecified part of neck of unspecified femur, subsequent encounter for closed fracture with routine healing: Secondary | ICD-10-CM | POA: Diagnosis not present

## 2012-05-13 DIAGNOSIS — F0390 Unspecified dementia without behavioral disturbance: Secondary | ICD-10-CM | POA: Diagnosis present

## 2012-05-13 DIAGNOSIS — Z881 Allergy status to other antibiotic agents status: Secondary | ICD-10-CM | POA: Diagnosis not present

## 2012-05-13 DIAGNOSIS — D649 Anemia, unspecified: Secondary | ICD-10-CM

## 2012-05-13 DIAGNOSIS — M479 Spondylosis, unspecified: Secondary | ICD-10-CM

## 2012-05-13 DIAGNOSIS — Z9181 History of falling: Secondary | ICD-10-CM | POA: Diagnosis not present

## 2012-05-13 DIAGNOSIS — I10 Essential (primary) hypertension: Secondary | ICD-10-CM | POA: Diagnosis not present

## 2012-05-13 DIAGNOSIS — M542 Cervicalgia: Secondary | ICD-10-CM | POA: Diagnosis not present

## 2012-05-13 DIAGNOSIS — W010XXA Fall on same level from slipping, tripping and stumbling without subsequent striking against object, initial encounter: Secondary | ICD-10-CM | POA: Diagnosis present

## 2012-05-13 DIAGNOSIS — R51 Headache: Secondary | ICD-10-CM | POA: Diagnosis not present

## 2012-05-13 DIAGNOSIS — G8190 Hemiplegia, unspecified affecting unspecified side: Secondary | ICD-10-CM

## 2012-05-13 DIAGNOSIS — K922 Gastrointestinal hemorrhage, unspecified: Secondary | ICD-10-CM

## 2012-05-13 DIAGNOSIS — R296 Repeated falls: Secondary | ICD-10-CM | POA: Diagnosis not present

## 2012-05-13 DIAGNOSIS — W19XXXA Unspecified fall, initial encounter: Secondary | ICD-10-CM

## 2012-05-13 DIAGNOSIS — E039 Hypothyroidism, unspecified: Secondary | ICD-10-CM | POA: Diagnosis present

## 2012-05-13 DIAGNOSIS — R1310 Dysphagia, unspecified: Secondary | ICD-10-CM

## 2012-05-13 DIAGNOSIS — S72143A Displaced intertrochanteric fracture of unspecified femur, initial encounter for closed fracture: Secondary | ICD-10-CM | POA: Diagnosis not present

## 2012-05-13 DIAGNOSIS — S79919A Unspecified injury of unspecified hip, initial encounter: Secondary | ICD-10-CM | POA: Diagnosis not present

## 2012-05-13 DIAGNOSIS — D638 Anemia in other chronic diseases classified elsewhere: Secondary | ICD-10-CM | POA: Diagnosis present

## 2012-05-13 DIAGNOSIS — M19029 Primary osteoarthritis, unspecified elbow: Secondary | ICD-10-CM | POA: Diagnosis not present

## 2012-05-13 DIAGNOSIS — S79929A Unspecified injury of unspecified thigh, initial encounter: Secondary | ICD-10-CM | POA: Diagnosis not present

## 2012-05-13 DIAGNOSIS — G9389 Other specified disorders of brain: Secondary | ICD-10-CM | POA: Diagnosis not present

## 2012-05-13 DIAGNOSIS — H3530 Unspecified macular degeneration: Secondary | ICD-10-CM | POA: Diagnosis present

## 2012-05-13 DIAGNOSIS — I6200 Nontraumatic subdural hemorrhage, unspecified: Secondary | ICD-10-CM | POA: Diagnosis not present

## 2012-05-13 DIAGNOSIS — R0902 Hypoxemia: Secondary | ICD-10-CM

## 2012-05-13 DIAGNOSIS — R4182 Altered mental status, unspecified: Secondary | ICD-10-CM | POA: Diagnosis not present

## 2012-05-13 DIAGNOSIS — S065X0A Traumatic subdural hemorrhage without loss of consciousness, initial encounter: Secondary | ICD-10-CM | POA: Diagnosis present

## 2012-05-13 DIAGNOSIS — K920 Hematemesis: Secondary | ICD-10-CM

## 2012-05-13 DIAGNOSIS — T148XXA Other injury of unspecified body region, initial encounter: Secondary | ICD-10-CM | POA: Diagnosis not present

## 2012-05-13 DIAGNOSIS — D62 Acute posthemorrhagic anemia: Secondary | ICD-10-CM | POA: Diagnosis not present

## 2012-05-13 DIAGNOSIS — IMO0002 Reserved for concepts with insufficient information to code with codable children: Secondary | ICD-10-CM | POA: Diagnosis not present

## 2012-05-13 DIAGNOSIS — N390 Urinary tract infection, site not specified: Secondary | ICD-10-CM | POA: Diagnosis present

## 2012-05-13 DIAGNOSIS — S72141A Displaced intertrochanteric fracture of right femur, initial encounter for closed fracture: Secondary | ICD-10-CM

## 2012-05-13 DIAGNOSIS — K279 Peptic ulcer, site unspecified, unspecified as acute or chronic, without hemorrhage or perforation: Secondary | ICD-10-CM

## 2012-05-13 DIAGNOSIS — K259 Gastric ulcer, unspecified as acute or chronic, without hemorrhage or perforation: Secondary | ICD-10-CM

## 2012-05-13 DIAGNOSIS — I517 Cardiomegaly: Secondary | ICD-10-CM | POA: Diagnosis not present

## 2012-05-13 DIAGNOSIS — Z043 Encounter for examination and observation following other accident: Secondary | ICD-10-CM | POA: Diagnosis not present

## 2012-05-13 DIAGNOSIS — Y92009 Unspecified place in unspecified non-institutional (private) residence as the place of occurrence of the external cause: Secondary | ICD-10-CM

## 2012-05-13 DIAGNOSIS — S5000XA Contusion of unspecified elbow, initial encounter: Secondary | ICD-10-CM | POA: Diagnosis not present

## 2012-05-13 DIAGNOSIS — M6281 Muscle weakness (generalized): Secondary | ICD-10-CM | POA: Diagnosis not present

## 2012-05-13 DIAGNOSIS — S72009A Fracture of unspecified part of neck of unspecified femur, initial encounter for closed fracture: Secondary | ICD-10-CM | POA: Diagnosis not present

## 2012-05-13 DIAGNOSIS — K5730 Diverticulosis of large intestine without perforation or abscess without bleeding: Secondary | ICD-10-CM

## 2012-05-13 DIAGNOSIS — M25559 Pain in unspecified hip: Secondary | ICD-10-CM | POA: Diagnosis not present

## 2012-05-13 HISTORY — DX: Gastric ulcer, unspecified as acute or chronic, without hemorrhage or perforation: K25.9

## 2012-05-13 HISTORY — DX: Anemia, unspecified: D64.9

## 2012-05-13 HISTORY — PX: FEMUR IM NAIL: SHX1597

## 2012-05-13 LAB — COMPREHENSIVE METABOLIC PANEL
ALT: 14 U/L (ref 0–35)
AST: 21 U/L (ref 0–37)
Albumin: 4.1 g/dL (ref 3.5–5.2)
Alkaline Phosphatase: 69 U/L (ref 39–117)
CO2: 25 mEq/L (ref 19–32)
Chloride: 100 mEq/L (ref 96–112)
Creatinine, Ser: 1.29 mg/dL — ABNORMAL HIGH (ref 0.50–1.10)
GFR calc non Af Amer: 35 mL/min — ABNORMAL LOW (ref >90)
Potassium: 4 mEq/L (ref 3.5–5.1)
Sodium: 134 mEq/L — ABNORMAL LOW (ref 135–145)
Total Bilirubin: 0.4 mg/dL (ref 0.3–1.2)

## 2012-05-13 LAB — SURGICAL PCR SCREEN: MRSA, PCR: NEGATIVE

## 2012-05-13 LAB — CBC WITH DIFFERENTIAL/PLATELET
Basophils Absolute: 0 10*3/uL (ref 0.0–0.1)
Basophils Relative: 0 % (ref 0–1)
HCT: 36.6 % (ref 36.0–46.0)
Lymphocytes Relative: 22 % (ref 12–46)
Monocytes Absolute: 0.8 10*3/uL (ref 0.1–1.0)
Neutro Abs: 4.3 10*3/uL (ref 1.7–7.7)
Neutrophils Relative %: 61 % (ref 43–77)
RDW: 12.8 % (ref 11.5–15.5)
WBC: 7 10*3/uL (ref 4.0–10.5)

## 2012-05-13 SURGERY — INSERTION, INTRAMEDULLARY ROD, FEMUR
Anesthesia: General | Site: Hip | Laterality: Right | Wound class: Clean

## 2012-05-13 MED ORDER — LIDOCAINE HCL (CARDIAC) 20 MG/ML IV SOLN
INTRAVENOUS | Status: DC | PRN
  Administered 2012-05-13: 40 mg via INTRAVENOUS

## 2012-05-13 MED ORDER — SODIUM CHLORIDE 0.9 % IV SOLN
INTRAVENOUS | Status: AC
  Administered 2012-05-13: 09:00:00 via INTRAVENOUS

## 2012-05-13 MED ORDER — LUTEIN 6 MG PO CAPS: 1.0000 | ORAL_CAPSULE | Freq: Every day | ORAL | Status: DC

## 2012-05-13 MED ORDER — CALCIUM CITRATE-VITAMIN D 200-250 MG-UNIT PO TABS: 2.0000 | ORAL_TABLET | Freq: Two times a day (BID) | ORAL | Status: DC

## 2012-05-13 MED ORDER — HYDROMORPHONE HCL PF 1 MG/ML IJ SOLN: 0.5000 mg | INTRAMUSCULAR | Status: AC | PRN

## 2012-05-13 MED ORDER — LEVOTHYROXINE SODIUM 125 MCG PO TABS
125.0000 ug | ORAL_TABLET | Freq: Every day | ORAL | Status: DC
  Administered 2012-05-14 – 2012-05-16 (×3): 125 ug via ORAL
  Filled 2012-05-13 (×5): qty 1

## 2012-05-13 MED ORDER — FENTANYL CITRATE 0.05 MG/ML IJ SOLN
50.0000 ug | Freq: Once | INTRAMUSCULAR | Status: AC
  Administered 2012-05-13: 50 ug via INTRAVENOUS
  Filled 2012-05-13: qty 2

## 2012-05-13 MED ORDER — DONEPEZIL HCL 10 MG PO TABS
10.0000 mg | ORAL_TABLET | Freq: Every day | ORAL | Status: DC
  Administered 2012-05-16: 10 mg via ORAL
  Filled 2012-05-13 (×4): qty 1

## 2012-05-13 MED ORDER — 0.9 % SODIUM CHLORIDE (POUR BTL) OPTIME
TOPICAL | Status: DC | PRN
  Administered 2012-05-13: 1000 mL

## 2012-05-13 MED ORDER — LACTATED RINGERS IV SOLN
INTRAVENOUS | Status: DC | PRN
  Administered 2012-05-13: 18:00:00 via INTRAVENOUS

## 2012-05-13 MED ORDER — ADULT MULTIVITAMIN W/MINERALS CH
1.0000 | ORAL_TABLET | Freq: Every day | ORAL | Status: DC
  Administered 2012-05-14 – 2012-05-16 (×4): 1 via ORAL
  Filled 2012-05-13 (×4): qty 1

## 2012-05-13 MED ORDER — ONDANSETRON HCL 4 MG/2ML IJ SOLN
INTRAMUSCULAR | Status: DC | PRN
  Administered 2012-05-13: 4 mg via INTRAVENOUS

## 2012-05-13 MED ORDER — DOCUSATE SODIUM 100 MG PO CAPS
100.0000 mg | ORAL_CAPSULE | Freq: Two times a day (BID) | ORAL | Status: DC
  Administered 2012-05-14 – 2012-05-16 (×4): 100 mg via ORAL
  Filled 2012-05-13 (×6): qty 1

## 2012-05-13 MED ORDER — ENOXAPARIN SODIUM 40 MG/0.4ML ~~LOC~~ SOLN
40.0000 mg | SUBCUTANEOUS | Status: DC
  Filled 2012-05-13: qty 0.4

## 2012-05-13 MED ORDER — LORAZEPAM 2 MG/ML IJ SOLN
0.5000 mg | Freq: Once | INTRAMUSCULAR | Status: AC
  Administered 2012-05-13: 0.5 mg via INTRAVENOUS
  Filled 2012-05-13: qty 1

## 2012-05-13 MED ORDER — ONDANSETRON HCL 4 MG/2ML IJ SOLN
4.0000 mg | Freq: Once | INTRAMUSCULAR | Status: AC
  Administered 2012-05-13: 4 mg via INTRAVENOUS
  Filled 2012-05-13: qty 2

## 2012-05-13 MED ORDER — ASPIRIN EC 81 MG PO TBEC
81.0000 mg | DELAYED_RELEASE_TABLET | Freq: Every day | ORAL | Status: DC
  Administered 2012-05-14 – 2012-05-15 (×2): 81 mg via ORAL
  Filled 2012-05-13 (×4): qty 1

## 2012-05-13 MED ORDER — LOSARTAN POTASSIUM 50 MG PO TABS
50.0000 mg | ORAL_TABLET | Freq: Every day | ORAL | Status: DC
  Administered 2012-05-14 – 2012-05-16 (×3): 50 mg via ORAL
  Filled 2012-05-13 (×3): qty 1

## 2012-05-13 MED ORDER — MORPHINE SULFATE 2 MG/ML IJ SOLN
0.5000 mg | INTRAMUSCULAR | Status: DC | PRN
  Administered 2012-05-14 – 2012-05-15 (×2): 0.5 mg via INTRAVENOUS
  Filled 2012-05-13 (×2): qty 1

## 2012-05-13 MED ORDER — FENTANYL CITRATE 0.05 MG/ML IJ SOLN: 25.0000 ug | INTRAMUSCULAR | Status: DC | PRN

## 2012-05-13 MED ORDER — PSYLLIUM 95 % PO PACK
1.0000 | PACK | Freq: Two times a day (BID) | ORAL | Status: DC
  Administered 2012-05-14 – 2012-05-16 (×4): 1 via ORAL
  Filled 2012-05-13 (×7): qty 1

## 2012-05-13 MED ORDER — DAILY VITE PO TABS: 1.0000 | ORAL_TABLET | Freq: Every day | ORAL | Status: DC

## 2012-05-13 MED ORDER — ENOXAPARIN SODIUM 30 MG/0.3ML ~~LOC~~ SOLN
30.0000 mg | SUBCUTANEOUS | Status: DC
  Administered 2012-05-14 – 2012-05-15 (×2): 30 mg via SUBCUTANEOUS
  Filled 2012-05-13 (×3): qty 0.3

## 2012-05-13 MED ORDER — SODIUM CHLORIDE 0.9 % IV SOLN
INTRAVENOUS | Status: DC | PRN
  Administered 2012-05-13: 17:00:00 via INTRAVENOUS

## 2012-05-13 MED ORDER — ONDANSETRON HCL 4 MG/2ML IJ SOLN
4.0000 mg | Freq: Three times a day (TID) | INTRAMUSCULAR | Status: AC | PRN
  Administered 2012-05-13: 4 mg via INTRAVENOUS
  Filled 2012-05-13: qty 2

## 2012-05-13 MED ORDER — CALCIUM CARBONATE-VITAMIN D 500-200 MG-UNIT PO TABS
1.0000 | ORAL_TABLET | Freq: Two times a day (BID) | ORAL | Status: DC
  Administered 2012-05-14 – 2012-05-16 (×4): 1 via ORAL
  Filled 2012-05-13 (×8): qty 1

## 2012-05-13 MED ORDER — INFLUENZA VIRUS VACC SPLIT PF IM SUSP
0.5000 mL | INTRAMUSCULAR | Status: AC
  Administered 2012-05-15: 0.5 mL via INTRAMUSCULAR
  Filled 2012-05-13: qty 0.5

## 2012-05-13 MED ORDER — ESCITALOPRAM OXALATE 10 MG PO TABS
10.0000 mg | ORAL_TABLET | Freq: Every day | ORAL | Status: DC
  Administered 2012-05-14 – 2012-05-16 (×3): 10 mg via ORAL
  Filled 2012-05-13 (×4): qty 1

## 2012-05-13 MED ORDER — BENEFIBER PO POWD: 10.0000 mL | Freq: Two times a day (BID) | ORAL | Status: DC

## 2012-05-13 MED ORDER — FERROUS SULFATE 325 (65 FE) MG PO TABS
325.0000 mg | ORAL_TABLET | Freq: Every day | ORAL | Status: DC
  Administered 2012-05-14 – 2012-05-15 (×2): 325 mg via ORAL
  Filled 2012-05-13 (×3): qty 1

## 2012-05-13 MED ORDER — PANTOPRAZOLE SODIUM 40 MG PO TBEC
40.0000 mg | DELAYED_RELEASE_TABLET | Freq: Every day | ORAL | Status: DC
  Administered 2012-05-14 – 2012-05-16 (×3): 40 mg via ORAL
  Filled 2012-05-13 (×3): qty 1

## 2012-05-13 MED ORDER — SODIUM CHLORIDE 0.9 % IV SOLN
10.0000 mg | INTRAVENOUS | Status: DC | PRN
  Administered 2012-05-13: 15 ug/min via INTRAVENOUS

## 2012-05-13 MED ORDER — SENNA 8.6 MG PO TABS
1.0000 | ORAL_TABLET | Freq: Two times a day (BID) | ORAL | Status: DC
  Administered 2012-05-14 – 2012-05-16 (×4): 8.6 mg via ORAL
  Filled 2012-05-13 (×7): qty 1

## 2012-05-13 MED ORDER — BIOTIN 300 MCG PO TABS: 1.0000 | ORAL_TABLET | Freq: Every day | ORAL | Status: DC

## 2012-05-13 MED ORDER — MEMANTINE HCL 10 MG PO TABS
10.0000 mg | ORAL_TABLET | Freq: Two times a day (BID) | ORAL | Status: DC
  Administered 2012-05-14 – 2012-05-16 (×4): 10 mg via ORAL
  Filled 2012-05-13 (×7): qty 1

## 2012-05-13 MED ORDER — DEXTROSE 5 % IV SOLN
INTRAVENOUS | Status: DC | PRN
  Administered 2012-05-13: 17:00:00 via INTRAVENOUS

## 2012-05-13 MED ORDER — ALPRAZOLAM 0.25 MG PO TABS
0.2500 mg | ORAL_TABLET | Freq: Two times a day (BID) | ORAL | Status: DC
  Administered 2012-05-14 – 2012-05-16 (×4): 0.25 mg via ORAL
  Filled 2012-05-13 (×4): qty 1

## 2012-05-13 MED ORDER — HYDROCODONE-ACETAMINOPHEN 5-325 MG PO TABS
1.0000 | ORAL_TABLET | Freq: Four times a day (QID) | ORAL | Status: DC | PRN
  Administered 2012-05-13: 2 via ORAL
  Administered 2012-05-14 – 2012-05-15 (×3): 1 via ORAL
  Filled 2012-05-13 (×4): qty 1

## 2012-05-13 MED ORDER — FENTANYL CITRATE 0.05 MG/ML IJ SOLN
INTRAMUSCULAR | Status: DC | PRN
  Administered 2012-05-13: 50 ug via INTRAVENOUS
  Administered 2012-05-13 (×2): 25 ug via INTRAVENOUS

## 2012-05-13 MED ORDER — PROPOFOL 10 MG/ML IV BOLUS
INTRAVENOUS | Status: DC | PRN
  Administered 2012-05-13: 30 mg via INTRAVENOUS
  Administered 2012-05-13 (×2): 50 mg via INTRAVENOUS
  Administered 2012-05-13: 30 mg via INTRAVENOUS

## 2012-05-13 MED ORDER — CEFAZOLIN SODIUM-DEXTROSE 2-3 GM-% IV SOLR
INTRAVENOUS | Status: DC | PRN
  Administered 2012-05-13: 2 g via INTRAVENOUS

## 2012-05-13 SURGICAL SUPPLY — 41 items
BANDAGE CONFORM 3  STR LF (GAUZE/BANDAGES/DRESSINGS) ×4 IMPLANT
BIT DRILL CANN LG 4.3MM (BIT) IMPLANT
BLADE SURG 15 STRL LF DISP TIS (BLADE) ×1 IMPLANT
BLADE SURG 15 STRL SS (BLADE) ×2
CLOTH BEACON ORANGE TIMEOUT ST (SAFETY) ×2 IMPLANT
CLSR STERI-STRIP ANTIMIC 1/2X4 (GAUZE/BANDAGES/DRESSINGS) ×1 IMPLANT
DRAPE STERI IOBAN 125X83 (DRAPES) ×2 IMPLANT
DRILL BIT CANN LG 4.3MM (BIT) ×2
DRSG ADAPTIC 3X8 NADH LF (GAUZE/BANDAGES/DRESSINGS) ×2 IMPLANT
DRSG MEPILEX BORDER 4X4 (GAUZE/BANDAGES/DRESSINGS) ×3 IMPLANT
DRSG MEPILEX BORDER 4X8 (GAUZE/BANDAGES/DRESSINGS) ×2 IMPLANT
ELECT REM PT RETURN 9FT ADLT (ELECTROSURGICAL) ×2
ELECTRODE REM PT RTRN 9FT ADLT (ELECTROSURGICAL) ×1 IMPLANT
GLOVE BIO SURGEON STRL SZ7.5 (GLOVE) ×2 IMPLANT
GLOVE BIO SURGEON STRL SZ8 (GLOVE) ×2 IMPLANT
GLOVE EUDERMIC 7 POWDERFREE (GLOVE) ×2 IMPLANT
GLOVE SS BIOGEL STRL SZ 7.5 (GLOVE) ×1 IMPLANT
GLOVE SUPERSENSE BIOGEL SZ 7.5 (GLOVE) ×1
GOWN STRL NON-REIN LRG LVL3 (GOWN DISPOSABLE) ×2 IMPLANT
GOWN STRL REIN XL XLG (GOWN DISPOSABLE) ×4 IMPLANT
GUIDEPIN 3.2X17.5 THRD DISP (PIN) ×1 IMPLANT
GUIDEWIRE BALL NOSE 100CM (WIRE) ×1 IMPLANT
KIT BASIN OR (CUSTOM PROCEDURE TRAY) ×2 IMPLANT
KIT ROOM TURNOVER OR (KITS) ×2 IMPLANT
MANIFOLD NEPTUNE II (INSTRUMENTS) ×2 IMPLANT
NAIL HIP FRACT 130D 11X180 (Screw) ×1 IMPLANT
NS IRRIG 1000ML POUR BTL (IV SOLUTION) ×2 IMPLANT
PACK GENERAL/GYN (CUSTOM PROCEDURE TRAY) ×2 IMPLANT
PAD ARMBOARD 7.5X6 YLW CONV (MISCELLANEOUS) ×4 IMPLANT
SCREW BONE CORTICAL 5.0X32 (Screw) ×1 IMPLANT
SCREW LAG 10.5MMX105MM HFN (Screw) ×1 IMPLANT
SPONGE LAP 18X18 X RAY DECT (DISPOSABLE) ×2 IMPLANT
STAPLER VISISTAT 35W (STAPLE) ×3 IMPLANT
STRIP CLOSURE SKIN 1/2X4 (GAUZE/BANDAGES/DRESSINGS) ×2 IMPLANT
SUT MNCRL AB 3-0 PS2 18 (SUTURE) ×2 IMPLANT
SUT VIC AB 0 CT1 27 (SUTURE) ×2
SUT VIC AB 0 CT1 27XBRD ANBCTR (SUTURE) ×1 IMPLANT
SUT VIC AB 2-0 CT1 27 (SUTURE) ×2
SUT VIC AB 2-0 CT1 TAPERPNT 27 (SUTURE) ×1 IMPLANT
TRAY FOLEY CATH 14FR (SET/KITS/TRAYS/PACK) IMPLANT
WATER STERILE IRR 1000ML POUR (IV SOLUTION) ×2 IMPLANT

## 2012-05-13 NOTE — Anesthesia Postprocedure Evaluation (Signed)
  Anesthesia Post-op Note  Patient: [name]  Procedure(s) Performed: Procedure(s) (LRB) with comments: INTRAMEDULLARY (IM) NAIL FEMORAL (Right)  Patient Location: PACU  Anesthesia Type: Spinal  Level of Consciousness: awake  Airway and Oxygen Therapy: Patient Spontanous Breathing  Post-op Pain: none  Post-op Assessment: Post-op Vital signs reviewed, Patient's Cardiovascular Status Stable, Respiratory Function Stable, Patent Airway, No signs of Nausea or vomiting and Pain level controlled  Post-op Vital Signs: stable  Complications: No apparent anesthesia complications

## 2012-05-13 NOTE — Transfer of Care (Signed)
Immediate Anesthesia Transfer of Care Note  Patient: [name]  Procedure(s) Performed: Procedure(s) (LRB) with comments: INTRAMEDULLARY (IM) NAIL FEMORAL (Right)  Patient Location: PACU  Anesthesia Type: Spinal  Level of Consciousness: awake  Airway & Oxygen Therapy: Patient Spontanous Breathing  Post-op Assessment: Report given to PACU RN and Post -op Vital signs reviewed and stable  Post vital signs: Reviewed and stable  Complications: No apparent anesthesia complications

## 2012-05-13 NOTE — Preoperative (Signed)
Beta Blockers   Reason not to administer Beta Blockers:Not Applicable 

## 2012-05-13 NOTE — Anesthesia Procedure Notes (Addendum)
Spinal  Patient location during procedure: OR Start time: 05/13/2012 4:58 PM End time: 05/13/2012 5:05 PM Preanesthetic Checklist Completed: patient identified, site marked, surgical consent, pre-op evaluation, timeout performed, IV checked, risks and benefits discussed and monitors and equipment checked Spinal Block Patient position: right lateral decubitus Prep: Betadine Patient monitoring: heart rate, cardiac monitor, continuous pulse ox and blood pressure Approach: midline Location: L3-4 Injection technique: single-shot Needle Needle gauge: 22 G Additional Notes Betadine prep, sterile tec #22 sn with clear csf No paresth Marc .75% 1cc No compl Effective block Dr Gypsy Balsam  Procedure Name: MAC Date/Time: 05/13/2012 4:50 PM Performed by: Wray Kearns A Pre-anesthesia Checklist: Patient identified, Timeout performed, Emergency Drugs available, Suction available and Patient being monitored Oxygen Delivery Method: Simple face mask Intubation Type: IV induction

## 2012-05-13 NOTE — H&P (Signed)
[facility] History and Physical  [name] ZOX:[id number] DOB: 12-02-1919 DOA: 05/13/2012   PCP: [name], MD   Chief Complaint: Right hip fracture  HPI:  78 year old female who was walking across her kitchen to get her keys when she slept on her kitchen wouldn't floor and fell onto her right side. Apparently, the patient also hit the back of her head. There was no loss of consciousness. The patient has been in her usual state of health. She denies any history of fevers, chills, chest discomfort, shortness of breath, nausea, vomiting, diarrhea, palpitations, dysuria, abdominal pain, dizziness. After the fall, the patient did experience pain in the back of her head as well as some dizziness. She states that this has improved. She went to [facility] ED and she was subsequently transferred to [facility] for definitive surgical therapy on her hip. X-rays of her right hip showed a comminuted right intertrochanteric fracture. Chest x-ray showed hyperinflation. CT of the brain was negative for acute findings and negative for skull fracture. CT of the cervical spine was negative for any trauma or fractures. X-ray of the right elbow was negative for any fractures. EKG showed sinus bradycardia with nonspecific ST-T wave changes. Dr. supple has been consulted from orthopedics who will take this patient for surgery today. The patient has not been started on any new medications recently Assessment/Plan: Right intertrochanteric femur fracture -No syncope, secondary to mechanical fall -Appreciate orthopedic surgery -DVT prophylaxis after surgery -PT/OT after surgery -Check UA Hypertension -Continue losartan Dementia -Continue Aricept and Namenda -Continue Xanax for agitation Hypothyroidism -Check TSH, continue Synthroid Macular degeneration   Active Problems:  * No active hospital problems. *        Past Medical History  Diagnosis Date  . Thyroid disease   . Hypertension     . Dementia   . Anemia   . Renal disorder   . Gastric ulcer     gi bleed secondary   Past Surgical History  Procedure Date  . Thyroid surgery   . Mastectomy   . Carpal tunnel release   . Abdominal hysterectomy    Social History:  reports that she has never smoked. She does not have any smokeless tobacco history on file. She reports that she does not drink alcohol or use illicit drugs.  Allergies  Allergen Reactions  . Amoxicillin-Pot Clavulanate     REACTION: UNKNOWN REACTION  . Cephalexin     REACTION: UNKNOWN REACTION    No family history on file.  Prior to Admission medications   Medication Sig Start Date End Date Taking? Authorizing Provider  acetaminophen (TYLENOL) 500 MG tablet Take 1,000 mg by mouth at bedtime.   Yes Historical Provider, MD  ALPRAZolam (XANAX) 0.25 MG tablet Take 0.25 mg by mouth 2 (two) times daily.   Yes Historical Provider, MD  aspirin EC 81 MG tablet Take 81 mg by mouth daily.   Yes Historical Provider, MD  Biotin 300 MCG TABS Take 1 tablet by mouth daily.   Yes Historical Provider, MD  Calcium Citrate-Vitamin D 200-250 MG-UNIT TABS Take 2 tablets by mouth 2 (two) times daily.    Yes Historical Provider, MD  donepezil (ARICEPT) 10 MG tablet Take 10 mg by mouth daily.    Yes Historical Provider, MD  escitalopram (LEXAPRO) 10 MG tablet Take 10 mg by mouth daily.   Yes Historical Provider, MD  ferrous sulfate 325 (65 FE) MG tablet Take 325 mg by mouth daily.   Yes Historical Provider,  MD  levothyroxine (SYNTHROID, LEVOTHROID) 125 MCG tablet Take 125 mcg by mouth daily.   Yes Historical Provider, MD  losartan (COZAAR) 50 MG tablet Take 50 mg by mouth daily.   Yes Historical Provider, MD  Lutein 6 MG CAPS Take 1 capsule by mouth daily.   Yes Historical Provider, MD  memantine (NAMENDA) 10 MG tablet Take 10 mg by mouth 2 (two) times daily.   Yes Historical Provider, MD  Multiple Vitamin (DAILY VITE) TABS Take 1 tablet by mouth daily.   Yes Historical  Provider, MD  omeprazole (PRILOSEC) 20 MG capsule Take 1 capsule (20 mg total) by mouth daily. 11/15/11  Yes Joselyn Arrow, NP  Wheat Dextrin (BENEFIBER) POWD Take 10 mLs by mouth 2 (two) times daily.   Yes Historical Provider, MD    Review of Systems:  Constitutional:  No weight loss, night sweats, Fevers, chills, fatigue.  Head&Eyes: No headache.  No vision loss.  No eye pain or scotoma ENT:  No Difficulty swallowing,Tooth/dental problems,Sore throat,  No ear ache, post nasal drip,  Cardio-vascular:  No chest pain, Orthopnea, PND, palpitations  GI:  No heartburn, indigestion, abdominal pain,  vomiting, diarrhea,loss of appetite, hematochezia, melena Resp:  No shortness of breath with exertion or at rest. No excess mucus, no productive cough, No non-productive cough, No coughing up of blood.No change in color of mucus.No wheezing.No chest wall deformity  Skin:  no rash or lesions.  GU:  no dysuria, change in color of urine, no urgency or frequency. No flank pain.  Musculoskeletal:   No decreased range of motion. No back pain.  Psych:  No change in mood or affect. No depression or anxiety. Neurologic:  no dysesthesia, no focal weakness, no vision loss. No syncope  Physical Exam: Filed Vitals:   05/13/12 0818 05/13/12 0918 05/13/12 1359  BP:  188/73 134/54  Pulse:  101 64  Temp:   97.8 F (36.6 C)  TempSrc:   Oral  Resp:  18 18  Height: 5\' 1"  (1.549 m)    Weight: 49.442 kg (109 lb)    SpO2:  98% 100%   General:  Alert, NAD, nontoxic, pleasant/cooperative Head/Eye: No conjunctival hemorrhage, no icterus, [place]/AT, No nystagmus ENT:  No icterus,  No thrush, good dentition, no pharyngeal exudate Neck:  No masses, no lymphadenpathy, no bruits CV:  RRR, no rub, 1/6 systolic murmur left lower sternal border Lung:  CTAB, good air movement, no wheeze, no rhonchi Abdomen: soft/NT, +BS, nondistended, no peritoneal signs Ext: No cyanosis, No rashes, No petechiae, No lymphangitis,  No edema   Labs on Admission:  Basic Metabolic Panel:  Lab 05/13/12 1610  NA 134*  K 4.0  CL 100  CO2 25  GLUCOSE 90  BUN 26*  CREATININE 1.29*  CALCIUM 9.6  MG --  PHOS --   Liver Function Tests:  Lab 05/13/12 0841  AST 21  ALT 14  ALKPHOS 69  BILITOT 0.4  PROT 6.9  ALBUMIN 4.1   No results found for this basename: LIPASE:5,AMYLASE:5 in the last 168 hours No results found for this basename: AMMONIA:5 in the last 168 hours CBC:  Lab 05/13/12 0841  WBC 7.0  NEUTROABS 4.3  HGB 12.3  HCT 36.6  MCV 96.3  PLT 208   Cardiac Enzymes:  Lab 05/13/12 0902  CKTOTAL --  CKMB --  CKMBINDEX --  TROPONINI <0.30   BNP: No components found with this basename: POCBNP:5 CBG: No results found for this basename: GLUCAP:5 in the  last 168 hours  Radiological Exams on Admission: Dg Chest 1 View  05/13/2012  *RADIOLOGY REPORT*  Clinical Data: Fall, right hip pain  CHEST - 1 VIEW  Comparison: 09/30/2011  Findings: Cardiomediastinal silhouette is stable.  Hyperinflation again noted.  No acute infiltrate or pulmonary edema.  Again noted extensive degenerative changes left shoulder. Atherosclerotic calcifications of the thoracic aorta.  IMPRESSION: No active disease.  Hyperinflation again noted.  Again noted extensive degenerative changes left shoulder.   Original Report Authenticated By: Natasha Mead, M.D.    Dg Elbow Complete Right  05/13/2012  *RADIOLOGY REPORT*  Clinical Data: Fall, hematoma  RIGHT ELBOW - COMPLETE 3+ VIEW  Comparison: 03/06/2010  Findings: Three views of the right elbow submitted.  No acute fracture or subluxation.  No posterior fat pad sign.  Soft tissue swelling noted dorsal olecranon region.  IMPRESSION: No acute fracture or subluxation.  No posterior fat pad sign.  Soft tissue swelling dorsal olecranon region.   Original Report Authenticated By: Natasha Mead, M.D.    Dg Hip Complete Right  05/13/2012  *RADIOLOGY REPORT*  Clinical Data: Fall, right hip pain  RIGHT  HIP - COMPLETE 2+ VIEW  Comparison: 06/04/2009  Findings: Three views of the right hip submitted.  There is a comminuted displaced intertrochanteric and subtrochanteric fracture of the proximal right femur.  The right femoral head is still located in the right acetabulum. The lesser femoral trochanter fragment is displaced medially.  IMPRESSION: Comminuted displaced intertrochanteric and subtrochanteric fracture of proximal right femur.   Original Report Authenticated By: Natasha Mead, M.D.    Dg Femur Right  05/13/2012  *RADIOLOGY REPORT*  Clinical Data: History of fall complaining of right-sided hip pain.  RIGHT FEMUR - 2 VIEW  Comparison: No priors.  Findings: Multiple views of the right femur demonstrate a comminuted intertrochanteric right femoral neck fracture (see dedicated report from the right hip for full details).  Distal femur is intact.  IMPRESSION: 1.  Comminuted intertrochanteric right hip fracture.   Original Report Authenticated By: Florencia Reasons, M.D.    Ct Head Wo Contrast  05/13/2012  *RADIOLOGY REPORT*  Clinical Data:  History of trauma from a fall.  CT HEAD WITHOUT CONTRAST CT CERVICAL SPINE WITHOUT CONTRAST  Technique:  Multidetector CT imaging of the head and cervical spine was performed following the standard protocol without intravenous contrast.  Multiplanar CT image reconstructions of the cervical spine were also generated.  Comparison:  Head CT 10/17/2011.  C-spine CT 03/06/2010.  CT HEAD  Findings: Moderate cerebral and cerebellar atrophy is age appropriate.  There are patchy areas of decreased attenuation throughout the deep and periventricular white matter of the cerebral hemispheres bilaterally, compatible with chronic microvascular ischemic changes. No acute displaced skull fractures are identified.  No acute intracranial abnormality.  Specifically, no evidence of acute post-traumatic intracranial hemorrhage, no definite regions of acute/subacute cerebral ischemia, no focal  mass, mass effect, hydrocephalus or abnormal intra or extra-axial fluid collections.  The visualized paranasal sinuses and mastoids are well pneumatized.  IMPRESSION: 1.  No acute displaced skull fractures or acute intracranial abnormalities. 2.  The appearance of brain is similar to prior examinations with moderate cerebral and cerebellar atrophy and chronic microvascular ischemic changes in the cerebral white matter.  CT CERVICAL SPINE  Findings: No acute displaced fractures of the cervical spine are noted.  There is severe multilevel degenerative disc disease, most pronounced at C3-C4, C4-C5, C5-C6 and C6-C7.  There is also severe multilevel facet arthropathy, most pronounced on  the right at the C4-C5 the level where the facets are fused.  3 mm of anterolisthesis of C6 on C7 is noted.  Prevertebral soft tissues are normal.  Visualized portions of the upper thorax are remarkable for some pleural parenchymal thickening in the lung apices bilaterally, compatible with scarring.  IMPRESSION: 1.  No evidence of significant acute traumatic injury to the cervical spine. 2.  Severe multilevel degenerative disc disease and cervical spondylosis, as detailed above, including 3 mm of anterolisthesis of C6 on C7, which is unchanged compared to prior study 03/06/2010.   Original Report Authenticated By: Florencia Reasons, M.D.    Ct Cervical Spine Wo Contrast  05/13/2012  *RADIOLOGY REPORT*  Clinical Data:  History of trauma from a fall.  CT HEAD WITHOUT CONTRAST CT CERVICAL SPINE WITHOUT CONTRAST  Technique:  Multidetector CT imaging of the head and cervical spine was performed following the standard protocol without intravenous contrast.  Multiplanar CT image reconstructions of the cervical spine were also generated.  Comparison:  Head CT 10/17/2011.  C-spine CT 03/06/2010.  CT HEAD  Findings: Moderate cerebral and cerebellar atrophy is age appropriate.  There are patchy areas of decreased attenuation throughout the deep  and periventricular white matter of the cerebral hemispheres bilaterally, compatible with chronic microvascular ischemic changes. No acute displaced skull fractures are identified.  No acute intracranial abnormality.  Specifically, no evidence of acute post-traumatic intracranial hemorrhage, no definite regions of acute/subacute cerebral ischemia, no focal mass, mass effect, hydrocephalus or abnormal intra or extra-axial fluid collections.  The visualized paranasal sinuses and mastoids are well pneumatized.  IMPRESSION: 1.  No acute displaced skull fractures or acute intracranial abnormalities. 2.  The appearance of brain is similar to prior examinations with moderate cerebral and cerebellar atrophy and chronic microvascular ischemic changes in the cerebral white matter.  CT CERVICAL SPINE  Findings: No acute displaced fractures of the cervical spine are noted.  There is severe multilevel degenerative disc disease, most pronounced at C3-C4, C4-C5, C5-C6 and C6-C7.  There is also severe multilevel facet arthropathy, most pronounced on the right at the C4-C5 the level where the facets are fused.  3 mm of anterolisthesis of C6 on C7 is noted.  Prevertebral soft tissues are normal.  Visualized portions of the upper thorax are remarkable for some pleural parenchymal thickening in the lung apices bilaterally, compatible with scarring.  IMPRESSION: 1.  No evidence of significant acute traumatic injury to the cervical spine. 2.  Severe multilevel degenerative disc disease and cervical spondylosis, as detailed above, including 3 mm of anterolisthesis of C6 on C7, which is unchanged compared to prior study 03/06/2010.   Original Report Authenticated By: Florencia Reasons, M.D.     EKG: Independently reviewed. Sinus rhythm, nonspecific ST-T wave changes    Time spend:70 minutes Code Status: full Family Communication: Daughter and son at the bedside   Jadae Steinke, DO  [facility] Pager [phone]  If  7PM-7AM, please contact night-coverage [email or web address] Password [facility] 05/13/2012, 2:48 PM

## 2012-05-13 NOTE — Progress Notes (Signed)
[name] is 78 year old lady with h/o Hypertension, Hypothyroidism, came in to [facility] for mechanical fall found to have intertrochanteric fracture of the  right hip, [facility] of the intertrochanteric fracture. She is being admitted to [facility] to hospitalist service.   [name] [phone]

## 2012-05-13 NOTE — ED Provider Notes (Signed)
History   This chart was scribed for Glynn Octave, MD by Charolett Bumpers . The patient was seen in room APA10/APA10. Patient's care was started at 0821.    CSN: [id number]  Arrival date & time 05/13/12  1914   First MD Initiated Contact with Patient 05/13/12 [phone]      Chief Complaint  Patient presents with  . Fall    (Consider location/radiation/quality/duration/timing/severity/associated sxs/prior treatment) HPI [name] is a 78 y.o. female who presents to the Emergency Department via EMS from [facility] complaining of constant, moderate right hip pain and headache after a mechanical fall that occurred earlier this morning. Pt was in c-collar upon arrival. Pt denies any dizziness, light-headedness or syncope prior to fall. Pt denies any chest pain, abdominal pain, neck pain. She states that she hit the back of her head but no LOC. Pt is not on aspirin or anticoagulants. Pt denies any h/o fractures or surgeries.   PCP: Dr. Phillips Odor  Past Medical History  Diagnosis Date  . Thyroid disease   . Hypertension   . Dementia   . Anemia   . Renal disorder   . Gastric ulcer     gi bleed secondary    Past Surgical History  Procedure Date  . Thyroid surgery   . Mastectomy   . Carpal tunnel release   . Abdominal hysterectomy     No family history on file.  History  Substance Use Topics  . Smoking status: Never Smoker   . Smokeless tobacco: Not on file  . Alcohol Use: No    OB History    Grav Para Term Preterm Abortions TAB SAB Ect Mult Living                  Review of Systems A complete 10 system review of systems was obtained and all systems are negative except as noted in the HPI and PMH.   Allergies  Amoxicillin-pot clavulanate and Cephalexin  Home Medications   Current Outpatient Rx  Name Route Sig Dispense Refill  . ACETAMINOPHEN 500 MG PO TABS Oral Take 1,000 mg by mouth at bedtime.    . ALPRAZOLAM 0.25 MG PO TABS Oral Take 0.25 mg by  mouth 2 (two) times daily.    . ASPIRIN EC 81 MG PO TBEC Oral Take 81 mg by mouth daily.    Marland Kitchen BIOTIN 300 MCG PO TABS Oral Take 1 tablet by mouth daily.    Marland Kitchen CALCIUM CITRATE-VITAMIN D 200-250 MG-UNIT PO TABS Oral Take 2 tablets by mouth 2 (two) times daily.     . DONEPEZIL HCL 10 MG PO TABS Oral Take 10 mg by mouth daily.     Marland Kitchen ESCITALOPRAM OXALATE 10 MG PO TABS Oral Take 10 mg by mouth daily.    Marland Kitchen FERROUS SULFATE 325 (65 FE) MG PO TABS Oral Take 325 mg by mouth daily.    Marland Kitchen LEVOTHYROXINE SODIUM 125 MCG PO TABS Oral Take 125 mcg by mouth daily.    Marland Kitchen LOSARTAN POTASSIUM 50 MG PO TABS Oral Take 50 mg by mouth daily.    . LUTEIN 6 MG PO CAPS Oral Take 1 capsule by mouth daily.    Marland Kitchen MEMANTINE HCL 10 MG PO TABS Oral Take 10 mg by mouth 2 (two) times daily.    Marland Kitchen DAILY VITE PO TABS Oral Take 1 tablet by mouth daily.    Marland Kitchen OMEPRAZOLE 20 MG PO CPDR Oral Take 1 capsule (20 mg total) by mouth daily.  31 capsule 11  . VITAMIN E 400 UNITS PO CAPS Oral Take 400 Units by mouth daily.    . BENEFIBER PO POWD Oral Take 10 mLs by mouth 2 (two) times daily.      Ht 5\' 1"  (1.549 m)  Wt 109 lb (49.442 kg)  BMI 20.60 kg/m2  Physical Exam  Nursing note and vitals reviewed. Constitutional: She is oriented to person, place, and time. She appears well-developed and well-nourished. No distress. Cervical collar in place.       C-collar was removed upon exam.   HENT:  Head: Normocephalic and atraumatic.       Erythema to occipital head.   Eyes: EOM are normal. Pupils are equal, round, and reactive to light.  Neck: Normal range of motion. Neck supple. No tracheal deviation present.  Cardiovascular: Normal rate, regular rhythm, normal heart sounds and intact distal pulses.        +2 DP and TP pulses.   Pulmonary/Chest: Effort normal and breath sounds normal. No respiratory distress. She has no wheezes.  Abdominal: She exhibits no distension.  Musculoskeletal: Normal range of motion. She exhibits no edema.       No  cervical midline tenderness. No lumbar or thoracic midline tenderness. Pelvis stable. RLE is shortened and externally rotated. Able to move toes.   Neurological: She is alert and oriented to person, place, and time.       No neuro deficits.   Skin: Skin is warm and dry.  Psychiatric: She has a normal mood and affect. Her behavior is normal.    ED Course  Procedures (including critical care time)  COORDINATION OF CARE:  08:32-Discussed planned course of treatment with the patient including blood work, imaging and, pain and nausea medication, who is agreeable at this time.   08:45-Medication Orders: Fentanyl (Sublimaze) injection 50 mcg-once; Ondansetron (Zofran) injection 4 mg-once.   09:45-Consultation with orthopedic surgery. Discussed pt's case with Dr. Rennis Chris at [facility]. He recommends NPO and is planning surgery this afternoon. Will consult hospitalist for admission.  09:50-Recheck: Informed pt of imaging results and planned admission for surgery. Pt is agreeable with plan.    Labs Reviewed - No data to display Dg Chest 1 View  05/13/2012  *RADIOLOGY REPORT*  Clinical Data: Fall, right hip pain  CHEST - 1 VIEW  Comparison: 09/30/2011  Findings: Cardiomediastinal silhouette is stable.  Hyperinflation again noted.  No acute infiltrate or pulmonary edema.  Again noted extensive degenerative changes left shoulder. Atherosclerotic calcifications of the thoracic aorta.  IMPRESSION: No active disease.  Hyperinflation again noted.  Again noted extensive degenerative changes left shoulder.   Original Report Authenticated By: Natasha Mead, M.D.    Dg Hip Complete Right  05/13/2012  *RADIOLOGY REPORT*  Clinical Data: Fall, right hip pain  RIGHT HIP - COMPLETE 2+ VIEW  Comparison: 06/04/2009  Findings: Three views of the right hip submitted.  There is a comminuted displaced intertrochanteric and subtrochanteric fracture of the proximal right femur.  The right femoral head is still located in the  right acetabulum. The lesser femoral trochanter fragment is displaced medially.  IMPRESSION: Comminuted displaced intertrochanteric and subtrochanteric fracture of proximal right femur.   Original Report Authenticated By: Natasha Mead, M.D.    Dg Femur Right  05/13/2012  *RADIOLOGY REPORT*  Clinical Data: History of fall complaining of right-sided hip pain.  RIGHT FEMUR - 2 VIEW  Comparison: No priors.  Findings: Multiple views of the right femur demonstrate a comminuted intertrochanteric right femoral neck  fracture (see dedicated report from the right hip for full details).  Distal femur is intact.  IMPRESSION: 1.  Comminuted intertrochanteric right hip fracture.   Original Report Authenticated By: Florencia Reasons, M.D.    Ct Head Wo Contrast  05/13/2012  *RADIOLOGY REPORT*  Clinical Data:  History of trauma from a fall.  CT HEAD WITHOUT CONTRAST CT CERVICAL SPINE WITHOUT CONTRAST  Technique:  Multidetector CT imaging of the head and cervical spine was performed following the standard protocol without intravenous contrast.  Multiplanar CT image reconstructions of the cervical spine were also generated.  Comparison:  Head CT 10/17/2011.  C-spine CT 03/06/2010.  CT HEAD  Findings: Moderate cerebral and cerebellar atrophy is age appropriate.  There are patchy areas of decreased attenuation throughout the deep and periventricular white matter of the cerebral hemispheres bilaterally, compatible with chronic microvascular ischemic changes. No acute displaced skull fractures are identified.  No acute intracranial abnormality.  Specifically, no evidence of acute post-traumatic intracranial hemorrhage, no definite regions of acute/subacute cerebral ischemia, no focal mass, mass effect, hydrocephalus or abnormal intra or extra-axial fluid collections.  The visualized paranasal sinuses and mastoids are well pneumatized.  IMPRESSION: 1.  No acute displaced skull fractures or acute intracranial abnormalities. 2.  The  appearance of brain is similar to prior examinations with moderate cerebral and cerebellar atrophy and chronic microvascular ischemic changes in the cerebral white matter.  CT CERVICAL SPINE  Findings: No acute displaced fractures of the cervical spine are noted.  There is severe multilevel degenerative disc disease, most pronounced at C3-C4, C4-C5, C5-C6 and C6-C7.  There is also severe multilevel facet arthropathy, most pronounced on the right at the C4-C5 the level where the facets are fused.  3 mm of anterolisthesis of C6 on C7 is noted.  Prevertebral soft tissues are normal.  Visualized portions of the upper thorax are remarkable for some pleural parenchymal thickening in the lung apices bilaterally, compatible with scarring.  IMPRESSION: 1.  No evidence of significant acute traumatic injury to the cervical spine. 2.  Severe multilevel degenerative disc disease and cervical spondylosis, as detailed above, including 3 mm of anterolisthesis of C6 on C7, which is unchanged compared to prior study 03/06/2010.   Original Report Authenticated By: Florencia Reasons, M.D.    Ct Cervical Spine Wo Contrast  05/13/2012  *RADIOLOGY REPORT*  Clinical Data:  History of trauma from a fall.  CT HEAD WITHOUT CONTRAST CT CERVICAL SPINE WITHOUT CONTRAST  Technique:  Multidetector CT imaging of the head and cervical spine was performed following the standard protocol without intravenous contrast.  Multiplanar CT image reconstructions of the cervical spine were also generated.  Comparison:  Head CT 10/17/2011.  C-spine CT 03/06/2010.  CT HEAD  Findings: Moderate cerebral and cerebellar atrophy is age appropriate.  There are patchy areas of decreased attenuation throughout the deep and periventricular white matter of the cerebral hemispheres bilaterally, compatible with chronic microvascular ischemic changes. No acute displaced skull fractures are identified.  No acute intracranial abnormality.  Specifically, no evidence of  acute post-traumatic intracranial hemorrhage, no definite regions of acute/subacute cerebral ischemia, no focal mass, mass effect, hydrocephalus or abnormal intra or extra-axial fluid collections.  The visualized paranasal sinuses and mastoids are well pneumatized.  IMPRESSION: 1.  No acute displaced skull fractures or acute intracranial abnormalities. 2.  The appearance of brain is similar to prior examinations with moderate cerebral and cerebellar atrophy and chronic microvascular ischemic changes in the cerebral white matter.  CT CERVICAL  SPINE  Findings: No acute displaced fractures of the cervical spine are noted.  There is severe multilevel degenerative disc disease, most pronounced at C3-C4, C4-C5, C5-C6 and C6-C7.  There is also severe multilevel facet arthropathy, most pronounced on the right at the C4-C5 the level where the facets are fused.  3 mm of anterolisthesis of C6 on C7 is noted.  Prevertebral soft tissues are normal.  Visualized portions of the upper thorax are remarkable for some pleural parenchymal thickening in the lung apices bilaterally, compatible with scarring.  IMPRESSION: 1.  No evidence of significant acute traumatic injury to the cervical spine. 2.  Severe multilevel degenerative disc disease and cervical spondylosis, as detailed above, including 3 mm of anterolisthesis of C6 on C7, which is unchanged compared to prior study 03/06/2010.   Original Report Authenticated By: Florencia Reasons, M.D.      No diagnosis found.    MDM  Mechanical fall with headache and right hip pain. No loss of consciousness. No C-spine pain. Right lower extremity shortened and externally rotated. No neurological deficits.  CT head and C spine unchanged.  No ortho coverage at [facility].  Hip fracture d/w Dr. Rennis Chris at [facility].  He plans surgery this afternoon. Will keep patient NPO. Dr. Blake Divine accepts patient for [facility].  Patient and family agreeable to transfer.    Date: 05/13/2012  Rate: 55   Rhythm: sinus arrhythmia  QRS Axis: normal  Intervals: normal  ST/T Wave abnormalities: normal  Conduction Disutrbances:left anterior fascicular block  Narrative Interpretation:   Old EKG Reviewed: unchanged   I personally performed the services described in this documentation, which was scribed in my presence.  The recorded information has been reviewed and considered.      Glynn Octave, MD 05/13/12 [phone]

## 2012-05-13 NOTE — ED Notes (Signed)
Pt arrived by ems from [facility]. Stated she "moved to fast" and fell, now c/o headache and right hip pain. Denies loc. arried in c-collar

## 2012-05-13 NOTE — Anesthesia Preprocedure Evaluation (Signed)
Anesthesia Evaluation  Patient identified by MRN, date of birth, ID band Patient awake    Reviewed: Allergy & Precautions, H&P , NPO status , Patient's Chart, lab work & pertinent test results  Airway Mallampati: I TM Distance: >3 FB Neck ROM: Full    Dental   Pulmonary  breath sounds clear to auscultation        Cardiovascular hypertension, Rhythm:Regular Rate:Normal     Neuro/Psych Dementia Head trauma with fall-no apparent deficits    GI/Hepatic PUD,   Endo/Other  Hypothyroidism   Renal/GU Renal InsufficiencyRenal disease     Musculoskeletal   Abdominal   Peds  Hematology   Anesthesia Other Findings   Reproductive/Obstetrics H/o breast ca                           Anesthesia Physical Anesthesia Plan  ASA: III and Emergent  Anesthesia Plan: Spinal   Post-op Pain Management:    Induction:   Airway Management Planned: Simple Face Mask  Additional Equipment:   Intra-op Plan:   Post-operative Plan:   Informed Consent: I have reviewed the patients History and Physical, chart, labs and discussed the procedure including the risks, benefits and alternatives for the proposed anesthesia with the patient or authorized representative who has indicated his/her understanding and acceptance.     Plan Discussed with: CRNA and Surgeon  Anesthesia Plan Comments:         Anesthesia Quick Evaluation

## 2012-05-13 NOTE — ED Notes (Addendum)
Report given to Annice Pih, RN 5N tower at [facility]. Awaiting [facility] for transport.

## 2012-05-13 NOTE — Consult Note (Signed)
Reason for Consult:Right IT hip Fx Referring Physician: EDP  HPI: [name] is an 78 y.o. female who presents to the [facility] Emergency Department via EMS from [facility] complaining of constant, moderate right hip pain and headache after a mechanical fall that occurred earlier this morning. Pt was in c-collar upon arrival. Pt denies any dizziness, light-headedness or syncope prior to fall. Pt denies any chest pain, abdominal pain, neck pain. She states that she hit the back of her head but no LOC. Pt is not on aspirin or anticoagulants. Pt denies any h/o fractures or surgeries.    Past Medical History  Diagnosis Date  . Thyroid disease   . Hypertension   . Dementia   . Anemia   . Renal disorder   . Gastric ulcer     gi bleed secondary    Past Surgical History  Procedure Date  . Thyroid surgery   . Mastectomy   . Carpal tunnel release   . Abdominal hysterectomy     No family history on file.  Social History:  reports that she has never smoked. She does not have any smokeless tobacco history on file. She reports that she does not drink alcohol or use illicit drugs.  Allergies:  Allergies  Allergen Reactions  . Amoxicillin-Pot Clavulanate     REACTION: UNKNOWN REACTION  . Cephalexin     REACTION: UNKNOWN REACTION    Medications: I have reviewed the patient's current medications.  Results for orders placed during the hospital encounter of 05/13/12 (from the past 48 hour(s))  CBC WITH DIFFERENTIAL     Status: Abnormal   Collection Time   05/13/12  8:41 AM      Component Value Range Comment   WBC 7.0  4.0 - 10.5 K/uL    RBC 3.80 (*) 3.87 - 5.11 MIL/uL    Hemoglobin 12.3  12.0 - 15.0 g/dL    HCT 16.1  09.6 - 04.5 %    MCV 96.3  78.0 - 100.0 fL    MCH 32.4  26.0 - 34.0 pg    MCHC 33.6  30.0 - 36.0 g/dL    RDW 40.9  81.1 - 91.4 %    Platelets 208  150 - 400 K/uL    Neutrophils Relative 61  43 - 77 %    Neutro Abs 4.3  1.7 - 7.7 K/uL    Lymphocytes Relative 22  12 -  46 %    Lymphs Abs 1.6  0.7 - 4.0 K/uL    Monocytes Relative 11  3 - 12 %    Monocytes Absolute 0.8  0.1 - 1.0 K/uL    Eosinophils Relative 6 (*) 0 - 5 %    Eosinophils Absolute 0.4  0.0 - 0.7 K/uL    Basophils Relative 0  0 - 1 %    Basophils Absolute 0.0  0.0 - 0.1 K/uL   COMPREHENSIVE METABOLIC PANEL     Status: Abnormal   Collection Time   05/13/12  8:41 AM      Component Value Range Comment   Sodium 134 (*) 135 - 145 mEq/L    Potassium 4.0  3.5 - 5.1 mEq/L    Chloride 100  96 - 112 mEq/L    CO2 25  19 - 32 mEq/L    Glucose, Bld 90  70 - 99 mg/dL    BUN 26 (*) 6 - 23 mg/dL    Creatinine, Ser 7.82 (*) 0.50 - 1.10 mg/dL  Calcium 9.6  8.4 - 10.5 mg/dL    Total Protein 6.9  6.0 - 8.3 g/dL    Albumin 4.1  3.5 - 5.2 g/dL    AST 21  0 - 37 U/L    ALT 14  0 - 35 U/L    Alkaline Phosphatase 69  39 - 117 U/L    Total Bilirubin 0.4  0.3 - 1.2 mg/dL    GFR calc non Af Amer 35 (*) >90 mL/min    GFR calc Af Amer 41 (*) >90 mL/min   PROTIME-INR     Status: Normal   Collection Time   05/13/12  8:41 AM      Component Value Range Comment   Prothrombin Time 14.8  11.6 - 15.2 seconds    INR 1.18  0.00 - 1.49   TROPONIN I     Status: Normal   Collection Time   05/13/12  9:02 AM      Component Value Range Comment   Troponin I <0.30  <0.30 ng/mL     Dg Chest 1 View  05/13/2012  *RADIOLOGY REPORT*  Clinical Data: Fall, right hip pain  CHEST - 1 VIEW  Comparison: 09/30/2011  Findings: Cardiomediastinal silhouette is stable.  Hyperinflation again noted.  No acute infiltrate or pulmonary edema.  Again noted extensive degenerative changes left shoulder. Atherosclerotic calcifications of the thoracic aorta.  IMPRESSION: No active disease.  Hyperinflation again noted.  Again noted extensive degenerative changes left shoulder.   Original Report Authenticated By: Natasha Mead, M.D.    Dg Elbow Complete Right  05/13/2012  *RADIOLOGY REPORT*  Clinical Data: Fall, hematoma  RIGHT ELBOW - COMPLETE 3+ VIEW   Comparison: 03/06/2010  Findings: Three views of the right elbow submitted.  No acute fracture or subluxation.  No posterior fat pad sign.  Soft tissue swelling noted dorsal olecranon region.  IMPRESSION: No acute fracture or subluxation.  No posterior fat pad sign.  Soft tissue swelling dorsal olecranon region.   Original Report Authenticated By: Natasha Mead, M.D.    Dg Hip Complete Right  05/13/2012  *RADIOLOGY REPORT*  Clinical Data: Fall, right hip pain  RIGHT HIP - COMPLETE 2+ VIEW  Comparison: 06/04/2009  Findings: Three views of the right hip submitted.  There is a comminuted displaced intertrochanteric and subtrochanteric fracture of the proximal right femur.  The right femoral head is still located in the right acetabulum. The lesser femoral trochanter fragment is displaced medially.  IMPRESSION: Comminuted displaced intertrochanteric and subtrochanteric fracture of proximal right femur.   Original Report Authenticated By: Natasha Mead, M.D.    Dg Femur Right  05/13/2012  *RADIOLOGY REPORT*  Clinical Data: History of fall complaining of right-sided hip pain.  RIGHT FEMUR - 2 VIEW  Comparison: No priors.  Findings: Multiple views of the right femur demonstrate a comminuted intertrochanteric right femoral neck fracture (see dedicated report from the right hip for full details).  Distal femur is intact.  IMPRESSION: 1.  Comminuted intertrochanteric right hip fracture.   Original Report Authenticated By: Florencia Reasons, M.D.    Ct Head Wo Contrast  05/13/2012  *RADIOLOGY REPORT*  Clinical Data:  History of trauma from a fall.  CT HEAD WITHOUT CONTRAST CT CERVICAL SPINE WITHOUT CONTRAST  Technique:  Multidetector CT imaging of the head and cervical spine was performed following the standard protocol without intravenous contrast.  Multiplanar CT image reconstructions of the cervical spine were also generated.  Comparison:  Head CT 10/17/2011.  C-spine CT 03/06/2010.  CT HEAD  Findings: Moderate cerebral  and cerebellar atrophy is age appropriate.  There are patchy areas of decreased attenuation throughout the deep and periventricular white matter of the cerebral hemispheres bilaterally, compatible with chronic microvascular ischemic changes. No acute displaced skull fractures are identified.  No acute intracranial abnormality.  Specifically, no evidence of acute post-traumatic intracranial hemorrhage, no definite regions of acute/subacute cerebral ischemia, no focal mass, mass effect, hydrocephalus or abnormal intra or extra-axial fluid collections.  The visualized paranasal sinuses and mastoids are well pneumatized.  IMPRESSION: 1.  No acute displaced skull fractures or acute intracranial abnormalities. 2.  The appearance of brain is similar to prior examinations with moderate cerebral and cerebellar atrophy and chronic microvascular ischemic changes in the cerebral white matter.  CT CERVICAL SPINE  Findings: No acute displaced fractures of the cervical spine are noted.  There is severe multilevel degenerative disc disease, most pronounced at C3-C4, C4-C5, C5-C6 and C6-C7.  There is also severe multilevel facet arthropathy, most pronounced on the right at the C4-C5 the level where the facets are fused.  3 mm of anterolisthesis of C6 on C7 is noted.  Prevertebral soft tissues are normal.  Visualized portions of the upper thorax are remarkable for some pleural parenchymal thickening in the lung apices bilaterally, compatible with scarring.  IMPRESSION: 1.  No evidence of significant acute traumatic injury to the cervical spine. 2.  Severe multilevel degenerative disc disease and cervical spondylosis, as detailed above, including 3 mm of anterolisthesis of C6 on C7, which is unchanged compared to prior study 03/06/2010.   Original Report Authenticated By: Florencia Reasons, M.D.    Ct Cervical Spine Wo Contrast  05/13/2012  *RADIOLOGY REPORT*  Clinical Data:  History of trauma from a fall.  CT HEAD WITHOUT  CONTRAST CT CERVICAL SPINE WITHOUT CONTRAST  Technique:  Multidetector CT imaging of the head and cervical spine was performed following the standard protocol without intravenous contrast.  Multiplanar CT image reconstructions of the cervical spine were also generated.  Comparison:  Head CT 10/17/2011.  C-spine CT 03/06/2010.  CT HEAD  Findings: Moderate cerebral and cerebellar atrophy is age appropriate.  There are patchy areas of decreased attenuation throughout the deep and periventricular white matter of the cerebral hemispheres bilaterally, compatible with chronic microvascular ischemic changes. No acute displaced skull fractures are identified.  No acute intracranial abnormality.  Specifically, no evidence of acute post-traumatic intracranial hemorrhage, no definite regions of acute/subacute cerebral ischemia, no focal mass, mass effect, hydrocephalus or abnormal intra or extra-axial fluid collections.  The visualized paranasal sinuses and mastoids are well pneumatized.  IMPRESSION: 1.  No acute displaced skull fractures or acute intracranial abnormalities. 2.  The appearance of brain is similar to prior examinations with moderate cerebral and cerebellar atrophy and chronic microvascular ischemic changes in the cerebral white matter.  CT CERVICAL SPINE  Findings: No acute displaced fractures of the cervical spine are noted.  There is severe multilevel degenerative disc disease, most pronounced at C3-C4, C4-C5, C5-C6 and C6-C7.  There is also severe multilevel facet arthropathy, most pronounced on the right at the C4-C5 the level where the facets are fused.  3 mm of anterolisthesis of C6 on C7 is noted.  Prevertebral soft tissues are normal.  Visualized portions of the upper thorax are remarkable for some pleural parenchymal thickening in the lung apices bilaterally, compatible with scarring.  IMPRESSION: 1.  No evidence of significant acute traumatic injury to the cervical spine. 2.  Severe multilevel  degenerative disc disease and cervical spondylosis, as detailed above, including 3 mm of anterolisthesis of C6 on C7, which is unchanged compared to prior study 03/06/2010.   Original Report Authenticated By: Florencia Reasons, M.D.     ROS: noted as above  Physical Exam: Thin WF, A and O, neck non tender. No clavicular crepitance, no gross bone or joint instability in UE's. Right elbow with superficial abrasion/contusion medially, minimally tender. RLE foreshortened and externally rotated, grossly N/V intact. LLE stable, non tender, N/V intact Vitals Temp:  [97.8 F (36.6 C)] 97.8 F (36.6 C) (09/22 1359) Pulse Rate:  [64-101] 64  (09/22 1359) Resp:  [18] 18  (09/22 1359) BP: (134-188)/(54-73) 134/54 mmHg (09/22 1359) SpO2:  [98 %-100 %] 100 % (09/22 1359) Weight:  [49.442 kg (109 lb)] 49.442 kg (109 lb) (09/22 0818)  Assessment/Plan: Impression: Displaced, comminuted right intertrochanteric hip fx. Treatment: treatment options, risks vs benefits there of discussed with patient and family.Plan for ORIF this afternoon. Post op management and principles and timeline reviewed.  Graylyn Bunney M 05/13/2012, 2:00 PM

## 2012-05-13 NOTE — Op Note (Signed)
05/13/2012  5:59 PM  PATIENT:   [name]  78 y.o. female  PRE-OPERATIVE DIAGNOSIS:  right intertrochanteric fracture  POST-OPERATIVE DIAGNOSIS:  same  PROCEDURE:  ORIF  SURGEON:  Adea Geisel, Vania Rea. M.D.  ASSISTANTS: Shuford pac   ANESTHESIA:   spinal  EBL: 200  SPECIMEN:  none  Drains: none   PATIENT DISPOSITION:  PACU - hemodynamically stable.    PLAN OF CARE: Admit to inpatient  WBAT 24hr abx lovenox while inpatient  Dictation# [id number]

## 2012-05-14 DIAGNOSIS — E039 Hypothyroidism, unspecified: Secondary | ICD-10-CM

## 2012-05-14 DIAGNOSIS — D649 Anemia, unspecified: Secondary | ICD-10-CM

## 2012-05-14 DIAGNOSIS — I10 Essential (primary) hypertension: Secondary | ICD-10-CM | POA: Diagnosis not present

## 2012-05-14 DIAGNOSIS — S72143A Displaced intertrochanteric fracture of unspecified femur, initial encounter for closed fracture: Secondary | ICD-10-CM | POA: Diagnosis not present

## 2012-05-14 LAB — BASIC METABOLIC PANEL
CO2: 20 mEq/L (ref 19–32)
Calcium: 8.2 mg/dL — ABNORMAL LOW (ref 8.4–10.5)
Creatinine, Ser: 1.36 mg/dL — ABNORMAL HIGH (ref 0.50–1.10)
GFR calc non Af Amer: 33 mL/min — ABNORMAL LOW (ref >90)
Glucose, Bld: 147 mg/dL — ABNORMAL HIGH (ref 70–99)
Sodium: 136 mEq/L (ref 135–145)

## 2012-05-14 LAB — URINALYSIS, ROUTINE W REFLEX MICROSCOPIC
Bilirubin Urine: NEGATIVE
Glucose, UA: NEGATIVE mg/dL
Hgb urine dipstick: NEGATIVE
Specific Gravity, Urine: 1.028 (ref 1.005–1.030)
Urobilinogen, UA: 0.2 mg/dL (ref 0.0–1.0)
pH: 5.5 (ref 5.0–8.0)

## 2012-05-14 LAB — CBC
Hemoglobin: 7.3 g/dL — ABNORMAL LOW (ref 12.0–15.0)
MCH: 31.9 pg (ref 26.0–34.0)
MCHC: 33 g/dL (ref 30.0–36.0)
MCV: 96.5 fL (ref 78.0–100.0)
Platelets: 153 10*3/uL (ref 150–400)
RBC: 2.29 MIL/uL — ABNORMAL LOW (ref 3.87–5.11)

## 2012-05-14 LAB — URINE MICROSCOPIC-ADD ON

## 2012-05-14 LAB — PREPARE RBC (CROSSMATCH)

## 2012-05-14 MED ORDER — CEFAZOLIN SODIUM 1-5 GM-% IV SOLN: 1.0000 g | Freq: Three times a day (TID) | INTRAVENOUS | Status: DC

## 2012-05-14 MED ORDER — CEFAZOLIN SODIUM 1-5 GM-% IV SOLN
1.0000 g | Freq: Three times a day (TID) | INTRAVENOUS | Status: AC
  Administered 2012-05-14 (×3): 1 g via INTRAVENOUS
  Filled 2012-05-14 (×4): qty 50

## 2012-05-14 MED ORDER — ENSURE COMPLETE PO LIQD: 237.0000 mL | ORAL | Status: DC | PRN

## 2012-05-14 NOTE — Progress Notes (Signed)
Orthopedic Tech Progress Note Patient Details:  [name] Sep 20, 1919 [id number]  Patient ID: [name], female   DOB: May 28, 1920, 78 y.o.   MRN: [id number] Trapeze bar patient helper  Nikki Dom 05/14/2012, 2:12 PM

## 2012-05-14 NOTE — Clinical Social Work Psychosocial (Addendum)
    Clinical Social Work Department BRIEF PSYCHOSOCIAL ASSESSMENT 05/16/2012  Patient:  [name], [name]     Account Number:  [id number]     Admit date:  05/13/2012  Clinical Social Worker:  Tiburcio Pea  Date/Time:  05/14/2012 05:43 PM  Referred by:  Physician  Date Referred:  05/14/2012 Referred for  SNF Placement   Other Referral:   Interview type:  Other - See comment Other interview type:   Daughter in law- June Harril (c) [phone] and Patient    PSYCHOSOCIAL DATA Living Status:  FACILITY Admitted from facility:  [facility] Level of care:  Assisted Living Primary support name:  Georgianne Fick   (daughter-in-law) Primary support relationship to patient:  FAMILY Degree of support available:   Strong support from mulitple family members    CURRENT CONCERNS Current Concerns  Post-Acute Placement   Other Concerns:    SOCIAL WORK ASSESSMENT / PLAN Resident of [facility]- patient will require SNF placement due to a new fractured hip. She has been a resident several years ago at the [facility] and wants to return there if possible. Discussed need for SNF search in [place]. Message left for Charlann Lange- SW at [facility] to call back regarding possible bed availability. FL2 will be initiated and sent to SNFs in [facility]   Assessment/plan status:  Psychosocial Support/Ongoing Assessment of Needs Other assessment/ plan:   Information/referral to community resources:   SNF bed list given to patient    PATIENT'S/FAMILY'S RESPONSE TO PLAN OF CARE: Patient is alert, oriented and pleasant. She agrees with bed search but hopes for SNF placement at the [facility].  Family is in agreement.

## 2012-05-14 NOTE — Evaluation (Signed)
Physical Therapy Evaluation Patient Details Name: [name] MRN: [id number] DOB: 1920-06-19 Today's Date: 05/14/2012 Time: 4098-1191 PT Time Calculation (min): 20 min  PT Assessment / Plan / Recommendation Clinical Impression  Pt is a 78 y.o. female s/p right hip ORIF.  Patient pleasent and agreeable to PT.  Unable to obtain full prior level of function and home set-up secondary to cognitive deficits as patient is poor historian.  Patient presents with HgB low 7.3 and was symptomatic upon sitting EOB.  Dizziness did not resolve until patient was returned to bed supine with HOB elevated. Patient monitored and nsg aware.  Will continue to see patient once HgB rises.  Will assess function further at that time.      PT Assessment  Patient needs continued PT services    Follow Up Recommendations  Skilled nursing facility;Supervision/Assistance - 24 hour    Barriers to Discharge Other (comment) (unable to assess )      Equipment Recommendations  Defer to next venue    Recommendations for Other Services     Frequency Min 3X/week    Precautions / Restrictions Restrictions Weight Bearing Restrictions: Yes RLE Weight Bearing: Weight bearing as tolerated   Pertinent Vitals/Pain Patient unable to give pain rating, but states she feels "bad"      Mobility  Bed Mobility Bed Mobility: Supine to Sit;Sitting - Scoot to Edge of Bed Supine to Sit: 3: Mod assist;HOB flat Sitting - Scoot to Edge of Bed: With rail;1: +2 Total assist;3: Mod assist Sitting - Scoot to Edge of Bed: Patient Percentage: 60% Details for Bed Mobility Assistance: Pt anxious and required assist for hip rotation and trunk support secondary to increased pain and weakness. Transfers Transfers: Not assessed (Patient reports significant dizziness, sympotmatic low HgB) Ambulation/Gait Ambulation/Gait Assistance: Not tested (comment)    Exercises General Exercises - Lower Extremity Ankle Circles/Pumps: AROM;Both;20 reps    PT Diagnosis: Difficulty walking;Abnormality of gait;Generalized weakness;Acute pain  PT Problem List: Decreased strength;Decreased range of motion;Decreased activity tolerance;Decreased balance;Decreased mobility;Decreased cognition;Decreased knowledge of use of DME;Decreased safety awareness;Pain PT Treatment Interventions: DME instruction;Gait training;Functional mobility training;Therapeutic activities;Therapeutic exercise;Patient/family education   PT Goals Acute Rehab PT Goals PT Goal Formulation: Patient unable to participate in goal setting Time For Goal Achievement: 05/19/12 Potential to Achieve Goals: Fair Pt will go Supine/Side to Sit: with modified independence PT Goal: Supine/Side to Sit - Progress: Goal set today Pt will go Sit to Stand: with modified independence PT Goal: Sit to Stand - Progress: Goal set today Pt will Ambulate: 16 - 50 feet;with supervision;with rolling walker PT Goal: Ambulate - Progress: Goal set today  Visit Information  Last PT Received On: 05/14/12    Subjective Data  Subjective: Pain is not too good this morning   Prior Functioning  Home Living (Information provided by patient but unsure of accuracy of answers provided.  Lives With: Patient states she lives alone Available Help at Discharge: Personal care attendant Type of Home: Assisted living Home Access: Stairs to enter Entrance Stairs-Number of Steps: 5 (questionable) Entrance Stairs-Rails: None Home Layout: Two level; Able to live on main level with bedroom/bathroom Bathroom Shower/Tub: Engineer, manufacturing systems: Standard Home Adaptive Equipment: Straight cane Additional Comments: Patient is poor historian, unable to distinguish social hx and prior living and function accurately Prior Function Level of Independence: Independent with assistive device(s) Able to Take Stairs?: Yes Driving: No Vocation: Retired    IT consultant  Overall Cognitive Status: Impaired Area of  Impairment: Attention;Memory;Safety/judgement Arousal/Alertness: Awake/alert Orientation  Level: Appears intact for tasks assessed;Oriented X4 / Intact Behavior During Session: Lethargic Current Attention Level: Selective Attention - Other Comments: VC's to redirect to task Memory Deficits: Pt is poor historian    Extremity/Trunk Assessment Right Lower Extremity Assessment RLE ROM/Strength/Tone: Unable to fully assess Left Lower Extremity Assessment LLE ROM/Strength/Tone: Unable to fully assess   Balance    End of Session PT - End of Session Activity Tolerance: Patient limited by fatigue;Patient limited by pain Patient left: in bed;with call bell/phone within reach Nurse Communication: Mobility status;Patient requests pain meds  GP     Fabio Asa 05/14/2012, 10:07 AM  Charlotte Crumb, PT DPT  [phone]

## 2012-05-14 NOTE — Progress Notes (Signed)
INITIAL ADULT NUTRITION ASSESSMENT Date: 05/14/2012   Time: 11:35 AM Reason for Assessment: consult; hip fx  ASSESSMENT: Female 78 y.o.  Dx: hip fx  Hx:  Past Medical History  Diagnosis Date  . Thyroid disease   . Hypertension   . Dementia   . Anemia   . Renal disorder   . Gastric ulcer     gi bleed secondary   Past Surgical History  Procedure Date  . Thyroid surgery   . Mastectomy   . Carpal tunnel release   . Abdominal hysterectomy     Related Meds:  Scheduled Meds:   . sodium chloride   Intravenous STAT  . ALPRAZolam  0.25 mg Oral BID  . aspirin EC  81 mg Oral Daily  . calcium-vitamin D  1 tablet Oral BID WC  .  ceFAZolin (ANCEF) IV  1 g Intravenous Q8H  . docusate sodium  100 mg Oral BID  . donepezil  10 mg Oral QHS  . enoxaparin (LOVENOX) injection  30 mg Subcutaneous Q24H  . escitalopram  10 mg Oral Daily  . ferrous sulfate  325 mg Oral Q breakfast  . influenza  inactive virus vaccine  0.5 mL Intramuscular Tomorrow-1000  . levothyroxine  125 mcg Oral Q breakfast  . losartan  50 mg Oral Daily  . memantine  10 mg Oral BID  . multivitamin with minerals  1 tablet Oral Daily  . pantoprazole  40 mg Oral Q1200  . psyllium  1 packet Oral BID  . senna  1 tablet Oral BID  . DISCONTD: Benefiber  10 mL Oral BID  . DISCONTD: Biotin  1 tablet Oral Daily  . DISCONTD: Calcium Citrate-Vitamin D  2 tablet Oral BID  . DISCONTD:  ceFAZolin (ANCEF) IV  1 g Intravenous Q8H  . DISCONTD: Daily Vite  1 tablet Oral Daily  . DISCONTD: enoxaparin (LOVENOX) injection  40 mg Subcutaneous Q24H  . DISCONTD: Lutein  1 capsule Oral Daily   Continuous Infusions:  PRN Meds:.HYDROcodone-acetaminophen, HYDROmorphone (DILAUDID) injection, morphine injection, ondansetron (ZOFRAN) IV, DISCONTD: 0.9 % irrigation (POUR BTL), DISCONTD: fentaNYL   Ht: 5\' 1"  (154.9 cm)  Wt: 109 lb (49.442 kg)  Ideal Wt: 105 lbs % Ideal Wt: 103%  Usual Wt: 106-120 lbs since 2010 per chart review % Usual  Wt: 100%  Body mass index is 20.60 kg/(m^2).  Food/Nutrition Related Hx: unable to assess  Labs:  CMP     Component Value Date/Time   NA 136 05/14/2012 0500   K 4.2 05/14/2012 0500   CL 105 05/14/2012 0500   CO2 20 05/14/2012 0500   GLUCOSE 147* 05/14/2012 0500   BUN 31* 05/14/2012 0500   CREATININE 1.36* 05/14/2012 0500   CALCIUM 8.2* 05/14/2012 0500   PROT 6.9 05/13/2012 0841   ALBUMIN 4.1 05/13/2012 0841   AST 21 05/13/2012 0841   ALT 14 05/13/2012 0841   ALKPHOS 69 05/13/2012 0841   BILITOT 0.4 05/13/2012 0841   GFRNONAA 33* 05/14/2012 0500   GFRAA 38* 05/14/2012 0500    CBC    Component Value Date/Time   WBC 10.0 05/14/2012 0500   RBC 2.29* 05/14/2012 0500   HGB 7.3* 05/14/2012 0500   HCT 22.1* 05/14/2012 0500   PLT 153 05/14/2012 0500   MCV 96.5 05/14/2012 0500   MCH 31.9 05/14/2012 0500   MCHC 33.0 05/14/2012 0500   RDW 13.2 05/14/2012 0500   LYMPHSABS 1.6 05/13/2012 0841   MONOABS 0.8 05/13/2012 0841   EOSABS 0.4 05/13/2012  0841   BASOSABS 0.0 05/13/2012 0841   Intake: tray not yet received Output:   Intake/Output Summary (Last 24 hours) at 05/14/12 1150 Last data filed at 05/14/12 1610  Gross per 24 hour  Intake   1780 ml  Output    600 ml  Net   1180 ml   Last BM date (9/21)  Diet Order: Regular  Supplements/Tube Feeding: MVI/MM once daily  IVF:    Estimated Nutritional Needs:   Kcal: 1390-1580 Protein: 59-70g Fluid: ~1.5 L/day  Pt sleeping soundly at time of visit.  She is s/p surgery for hip fx after mechanical fall.  Diet advanced to Regular. Wt per chart review appears to be stable.  Unable to assess appetite and usual eating habits at this time.  RD to continue to follow.  Will order supplement for pt to promote increased protein intake.    NUTRITION DIAGNOSIS: -Increased nutrient needs (NI-5.1).  Status: Ongoing  RELATED TO: healing  AS EVIDENCE BY: hip fx s/p surgical repair  MONITORING/EVALUATION(Goals): 1.  Food/Beverage; pt to consume at least 50%  of meals on average  EDUCATION NEEDS: -No education needs identified at this time  INTERVENTION: 1.  Supplements; Ensure Complete prn  Dietitian #:[phone]  DOCUMENTATION CODES Per approved criteria  -Not Applicable    Loyce Dys [facility] 05/14/2012, 11:35 AM

## 2012-05-14 NOTE — Clinical Documentation Improvement (Signed)
Anemia Blood Loss Clarification  THIS DOCUMENT IS NOT A PERMANENT PART OF THE MEDICAL RECORD  RESPOND TO THE THIS QUERY, FOLLOW THE INSTRUCTIONS BELOW:  1. If needed, update documentation for the patient's encounter via the notes activity.  2. Access this query again and click edit on the In [employer].  3. After updating, or not, click F2 to complete all highlighted (required) fields concerning your review. Select "additional documentation in the medical record" OR "no additional documentation provided".  4. Click Sign note button.  5. The deficiency will fall out of your In Basket *Please let us know if you are not able to complete this workflow by phone or e-mail (listed below).        05/14/12  Dear Dr. Onalee Hua [facility]  In an effort to better capture your patient's severity of illness, reflect appropriate length of stay and utilization of resources, a review of the patient medical record has revealed the following indicators.    Based on your clinical judgment, please clarify and document in a progress note and/or discharge summary the clinical condition associated with the following supporting information:  In responding to this query please exercise your independent judgment.  The fact that a query is asked, does not imply that any particular answer is desired or expected.   Noted patient's H/H 7.3/ 22.1 (9/23) down from 12.3/ 36.6 (9/22).  Patient transfused 2 units PRBC. Please document secondary diagnosis being treated.  Thank you    Possible Clinical Conditions?   " Expected Acute Blood Loss Anemia  " Acute Blood Loss Anemia  " Precipitous drop in Hematocrit (dilutional)  " Other Condition  " Cannot Clinically Determine   Risk Factors:  S/p IM nailing for rt IT hip fracture,  EBL 200      Diagnostics: H&H on admit: 12.3/ 36.6 (9/22) Current H & H  7.3/ 22.1 (9/23) Transfusion: 2 units PRBC  IV fluids: Ns @ 75 ml /hr Medications (Fe, Procrit) Iron  325 mg po daily   Reviewed: ans iron def anemia pn 9/24  Thank You,  Leonette Most Trell Secrist  Clinical Documentation Specialist RN, BSN: Pager [phone] HIM off # [phone]  Health Information Management [place]

## 2012-05-14 NOTE — Op Note (Signed)
[name], [name]                 ACCOUNT NO.:  [id number]  MEDICAL RECORD NO.:  [id number]  LOCATION:  5N12C                        FACILITY:  [facility]  PHYSICIAN:  Vania Rea. Nandita Mathenia, M.D.  DATE OF BIRTH:  [birth year]  DATE OF PROCEDURE:  05/13/2012 DATE OF DISCHARGE:                              OPERATIVE REPORT   PREOPERATIVE DIAGNOSIS:  Displaced right intertrochanteric hip fracture.  POSTOPERATIVE DIAGNOSIS:  Displaced right intertrochanteric hip fracture.  PROCEDURE:  Open reduction and internal fixation of displaced right intertrochanteric fracture.  SURGEON:  Vania Rea. Julio Storr, M.D.  Threasa HeadsFrench Ana A. Shuford, PA-C.  ANESTHESIA:  Spinal.  BLOOD LOSS:  200 mL.  DRAINS:  None.  HISTORY:  [name] is a 78 year old female who had a mechanical fall earlier today with immediate complaints of right hip pain and inability to bear weight.  She was initially brought to the [facility] Room, where on evaluation she was found to have a foreshortened and externally rotated right lower extremity and radiographs confirmed a displaced intertrochanteric fracture on the right.  She was subsequently transferred to [facility] for admission to the Hospitalist Service and surgical management by orthopedics for the treatment of her right intertrochanteric hip fracture.  Preoperatively, I had counseled [name] and her family members regarding treatment options and risks versus benefits thereof.  Possible surgical complications were reviewed including potential for bleeding, infection, neurovascular injury, DVT, PE, malunion, nonunion, loss of fixation, and possible need for additional surgery, and potential for anesthetic complications.  They understand and accept and agree with our planned procedure.  PROCEDURE IN DETAIL:  After undergoing routine preop evaluation, the patient received prophylactic antibiotics and the patient was brought to the operating room.   On the hospital bed, we had a spinal anesthetic established by the Anesthesia Department and then with achievement of adequate anesthesia, she was transferred to the fracture table.  In supine position, the right leg was placed in a leg holder with gentle traction and left leg was placed in a well-leg holder.  At this point, fluoroscopic images were then obtained.  I performed some additional adjustments to the alignment to obtain overall good position of the fracture site.  At this point, the right hip girdle region was sterilely prepped and draped in standard fashion.  Time-out was called.  A 3-cm longitudinal incision was then made proximal to the tip of the greater trochanter and dissection carried down through skin and subcutaneous tissues to the tip of the trochanter.  We divided the fascia to obtain access to the tip of the trochanter and a starting awl was introduced and fluoroscopic imaging was then used to confirm proper alignment on both AP and lateral views.  The guidewire was then directed down the femoral canal.  This was then reamed and then a standard 11 mm short- nail from the [employer] system was then passed over the guidewire into the proximal femur.  Using the outrigger guide, I then directed a guidepin into the femoral neck and head with proper positioning and confirmed proper position on both AP and lateral fluoroscopic imaging.  We then drilled this and passed a 105-mm lag screw  over the guidewire up to the femoral neck and head.  We then transfixed this using the dynamic locking mechanism.  We then used the outrigger guide to place a static lock screw distally through the nail.  Final fluoroscopic images were then obtained, which showed good position of the fracture site, good overall alignment and position of the hardware.  The wounds were all then irrigated and hemostasis was obtained.  They were closed in layers with 2-0 Vicryl for the deep fascial layers and  intracuticular 3-0 Monocryl for the skin.  Dry dressings were applied.  Ralene Bathe, PAC, was used as an Geophysicist/field seismologist in this case to help with positioning of the extremity, wound closure, management of the implants, and intraoperative decision making.  Wounds were closed with Monocryl and Steri-Strips.  Dry dressing taped about the incision of the right hip.  The patient was then removed from traction and placed supine and transferred to hospital bed and transferred to recovery room in stable condition.     Vania Rea. Nickalas Mccarrick, M.D.     KMS/MEDQ  D:  05/13/2012  T:  05/14/2012  Job:  [id number]

## 2012-05-14 NOTE — Progress Notes (Signed)
UR COMPLETED  

## 2012-05-14 NOTE — Progress Notes (Signed)
[name]  MRN: [id number] DOB/Age: January 27, 1920 78 y.o. Physician: Jacquelyne Balint Procedure: Procedure(s) (LRB): INTRAMEDULLARY (IM) NAIL FEMORAL (Right)     Subjective: Eating lunch. Currently receiving blood transfusion. Family in room, no specific complaints   Vital Signs Temp:  [97.8 F (36.6 C)-99.3 F (37.4 C)] 99.3 F (37.4 C) (09/23 1209) Pulse Rate:  [64-86] 77  (09/23 1209) Resp:  [12-22] 19  (09/23 1209) BP: (97-175)/(42-92) 115/49 mmHg (09/23 1209) SpO2:  [96 %-100 %] 96 % (09/23 1209)  Lab Results  Basename 05/14/12 0500 05/13/12 0841  WBC 10.0 7.0  HGB 7.3* 12.3  HCT 22.1* 36.6  PLT 153 208   BMET  Basename 05/14/12 0500 05/13/12 0841  NA 136 134*  K 4.2 4.0  CL 105 100  CO2 20 25  GLUCOSE 147* 90  BUN 31* 26*  CREATININE 1.36* 1.29*  CALCIUM 8.2* 9.6   INR  Date Value Range Status  05/13/2012 1.18  0.00 - 1.49 Final     Exam Right hip with scant old bloody drainage to dressing. Thigh soft. moves foot well. Sensation intact        Plan Agree with 2u prbcs Continue PT/OT after transfusion and discussed anticipated need for skilled care in her recovery Will follow  [name] for Dr.Kevin Supple 05/14/2012, 12:59 PM

## 2012-05-14 NOTE — Progress Notes (Signed)
Pt's IV was occluded numerous times during blood admin, because pt would bend her arm.  The calculated for blood input on 2nd unit was not accurate, estimate is 350 ml.  Nsg to continue to monitor.

## 2012-05-14 NOTE — Progress Notes (Signed)
[facility] PROGRESS NOTE  RAMATA STROTHMAN ZOX:[id number] DOB: 09-19-1919 DOA: 05/13/2012 PCP: [name], MD  Assessment/Plan:  Right intertrochanteric femur fracture  -No syncope, secondary to mechanical fall  -Appreciate orthopedic surgery  S/p ORIF right hip (9/22) -DVT prophylaxis after surgery  -PT/OT after surgery  -Check UA  Anemia -Will check iron studies -2 units packed red blood cells transfused(9/23) Hypertension  -Continue losartan  -Well controlled Dementia  -Continue Aricept and Namenda  -Continue Xanax for agitation  Hypothyroidism  -Check TSH, continue Synthroid  Macular degeneration     Family Communication:   Pt at beside Disposition Plan:   Back to skilled nursing facility       Procedures/Studies: Dg Chest 1 View  05/13/2012  *RADIOLOGY REPORT*  Clinical Data: Fall, right hip pain  CHEST - 1 VIEW  Comparison: 09/30/2011  Findings: Cardiomediastinal silhouette is stable.  Hyperinflation again noted.  No acute infiltrate or pulmonary edema.  Again noted extensive degenerative changes left shoulder. Atherosclerotic calcifications of the thoracic aorta.  IMPRESSION: No active disease.  Hyperinflation again noted.  Again noted extensive degenerative changes left shoulder.   Original Report Authenticated By: Natasha Mead, M.D.    Dg Elbow Complete Right  05/13/2012  *RADIOLOGY REPORT*  Clinical Data: Fall, hematoma  RIGHT ELBOW - COMPLETE 3+ VIEW  Comparison: 03/06/2010  Findings: Three views of the right elbow submitted.  No acute fracture or subluxation.  No posterior fat pad sign.  Soft tissue swelling noted dorsal olecranon region.  IMPRESSION: No acute fracture or subluxation.  No posterior fat pad sign.  Soft tissue swelling dorsal olecranon region.   Original Report Authenticated By: Natasha Mead, M.D.    Dg Hip Complete Right  05/13/2012  *RADIOLOGY REPORT*  Clinical Data: Fall, right hip pain  RIGHT HIP - COMPLETE 2+ VIEW  Comparison:  06/04/2009  Findings: Three views of the right hip submitted.  There is a comminuted displaced intertrochanteric and subtrochanteric fracture of the proximal right femur.  The right femoral head is still located in the right acetabulum. The lesser femoral trochanter fragment is displaced medially.  IMPRESSION: Comminuted displaced intertrochanteric and subtrochanteric fracture of proximal right femur.   Original Report Authenticated By: Natasha Mead, M.D.    Dg Hip Operative Right  05/13/2012  *RADIOLOGY REPORT*  Clinical Data: 78 year old female undergoing ORIF right hip.  DG OPERATIVE RIGHT HIP  Comparison: Preoperative films 0916 hours the same day.  Fluoroscopy time of 0.8 minutes was utilized.  Findings: Four intraoperative fluoroscopic views of the proximal right femur.  Intramedullary rod and interlocking proximal dynamic hip screw traverse the comminuted intertrochanteric fracture. Distal interlocking cortical screw.  Hardware appears intact. Improved alignment about the proximal femur fracture.  IMPRESSION: ORIF right proximal femur with no adverse features.   Original Report Authenticated By: Harley Hallmark, M.D.    Dg Femur Right  05/13/2012  *RADIOLOGY REPORT*  Clinical Data: History of fall complaining of right-sided hip pain.  RIGHT FEMUR - 2 VIEW  Comparison: No priors.  Findings: Multiple views of the right femur demonstrate a comminuted intertrochanteric right femoral neck fracture (see dedicated report from the right hip for full details).  Distal femur is intact.  IMPRESSION: 1.  Comminuted intertrochanteric right hip fracture.   Original Report Authenticated By: Florencia Reasons, M.D.    Ct Head Wo Contrast  05/13/2012  *RADIOLOGY REPORT*  Clinical Data:  History of trauma from a fall.  CT HEAD WITHOUT CONTRAST CT CERVICAL SPINE WITHOUT CONTRAST  Technique:  Multidetector CT imaging of the head and cervical spine was performed following the standard protocol without intravenous contrast.   Multiplanar CT image reconstructions of the cervical spine were also generated.  Comparison:  Head CT 10/17/2011.  C-spine CT 03/06/2010.  CT HEAD  Findings: Moderate cerebral and cerebellar atrophy is age appropriate.  There are patchy areas of decreased attenuation throughout the deep and periventricular white matter of the cerebral hemispheres bilaterally, compatible with chronic microvascular ischemic changes. No acute displaced skull fractures are identified.  No acute intracranial abnormality.  Specifically, no evidence of acute post-traumatic intracranial hemorrhage, no definite regions of acute/subacute cerebral ischemia, no focal mass, mass effect, hydrocephalus or abnormal intra or extra-axial fluid collections.  The visualized paranasal sinuses and mastoids are well pneumatized.  IMPRESSION: 1.  No acute displaced skull fractures or acute intracranial abnormalities. 2.  The appearance of brain is similar to prior examinations with moderate cerebral and cerebellar atrophy and chronic microvascular ischemic changes in the cerebral white matter.  CT CERVICAL SPINE  Findings: No acute displaced fractures of the cervical spine are noted.  There is severe multilevel degenerative disc disease, most pronounced at C3-C4, C4-C5, C5-C6 and C6-C7.  There is also severe multilevel facet arthropathy, most pronounced on the right at the C4-C5 the level where the facets are fused.  3 mm of anterolisthesis of C6 on C7 is noted.  Prevertebral soft tissues are normal.  Visualized portions of the upper thorax are remarkable for some pleural parenchymal thickening in the lung apices bilaterally, compatible with scarring.  IMPRESSION: 1.  No evidence of significant acute traumatic injury to the cervical spine. 2.  Severe multilevel degenerative disc disease and cervical spondylosis, as detailed above, including 3 mm of anterolisthesis of C6 on C7, which is unchanged compared to prior study 03/06/2010.   Original Report  Authenticated By: Florencia Reasons, M.D.    Ct Cervical Spine Wo Contrast  05/13/2012  *RADIOLOGY REPORT*  Clinical Data:  History of trauma from a fall.  CT HEAD WITHOUT CONTRAST CT CERVICAL SPINE WITHOUT CONTRAST  Technique:  Multidetector CT imaging of the head and cervical spine was performed following the standard protocol without intravenous contrast.  Multiplanar CT image reconstructions of the cervical spine were also generated.  Comparison:  Head CT 10/17/2011.  C-spine CT 03/06/2010.  CT HEAD  Findings: Moderate cerebral and cerebellar atrophy is age appropriate.  There are patchy areas of decreased attenuation throughout the deep and periventricular white matter of the cerebral hemispheres bilaterally, compatible with chronic microvascular ischemic changes. No acute displaced skull fractures are identified.  No acute intracranial abnormality.  Specifically, no evidence of acute post-traumatic intracranial hemorrhage, no definite regions of acute/subacute cerebral ischemia, no focal mass, mass effect, hydrocephalus or abnormal intra or extra-axial fluid collections.  The visualized paranasal sinuses and mastoids are well pneumatized.  IMPRESSION: 1.  No acute displaced skull fractures or acute intracranial abnormalities. 2.  The appearance of brain is similar to prior examinations with moderate cerebral and cerebellar atrophy and chronic microvascular ischemic changes in the cerebral white matter.  CT CERVICAL SPINE  Findings: No acute displaced fractures of the cervical spine are noted.  There is severe multilevel degenerative disc disease, most pronounced at C3-C4, C4-C5, C5-C6 and C6-C7.  There is also severe multilevel facet arthropathy, most pronounced on the right at the C4-C5 the level where the facets are fused.  3 mm of anterolisthesis of C6 on C7 is noted.  Prevertebral soft tissues are normal.  Visualized portions of the upper thorax are remarkable for some pleural parenchymal thickening in  the lung apices bilaterally, compatible with scarring.  IMPRESSION: 1.  No evidence of significant acute traumatic injury to the cervical spine. 2.  Severe multilevel degenerative disc disease and cervical spondylosis, as detailed above, including 3 mm of anterolisthesis of C6 on C7, which is unchanged compared to prior study 03/06/2010.   Original Report Authenticated By: Florencia Reasons, M.D.          Subjective: Patient is doing well today. She denies any chest pain, shortness breath, nausea, vomiting, abdominal pain. She states that her pain is controlled.  Objective: Filed Vitals:   05/14/12 1552 05/14/12 1652 05/14/12 1752 05/14/12 1852  BP: 113/48 106/52 130/52 127/50  Pulse: 77 75 76 74  Temp: 98.1 F (36.7 C) 97.9 F (36.6 C) 100.1 F (37.8 C) 98.9 F (37.2 C)  TempSrc:      Resp: 20 18 18 18   Height:      Weight:      SpO2:        Intake/Output Summary (Last 24 hours) at 05/14/12 2000 Last data filed at 05/14/12 1852  Gross per 24 hour  Intake 941.25 ml  Output    300 ml  Net 641.25 ml   Weight change:  Exam:   General:  Pt is alert, follows commands appropriately, not in acute distress  HEENT: No icterus, No thrush, Angelina/AT  Cardiovascular: RRR, S1/S2  Respiratory: Clear to auscultation bilaterally, no wheezing, no crackles, no rhonchi  Abdomen: Soft/+BS, non tender, non distended, no guarding  Extremities: No edema, No lymphangitis, No petechiae, No rashes, no synovitis  Data Reviewed: Basic Metabolic Panel:  Lab 05/14/12 7425 05/13/12 0841  NA 136 134*  K 4.2 4.0  CL 105 100  CO2 20 25  GLUCOSE 147* 90  BUN 31* 26*  CREATININE 1.36* 1.29*  CALCIUM 8.2* 9.6  MG -- --  PHOS -- --   Liver Function Tests:  Lab 05/13/12 0841  AST 21  ALT 14  ALKPHOS 69  BILITOT 0.4  PROT 6.9  ALBUMIN 4.1   No results found for this basename: LIPASE:5,AMYLASE:5 in the last 168 hours No results found for this basename: AMMONIA:5 in the last 168  hours CBC:  Lab 05/14/12 0500 05/13/12 0841  WBC 10.0 7.0  NEUTROABS -- 4.3  HGB 7.3* 12.3  HCT 22.1* 36.6  MCV 96.5 96.3  PLT 153 208   Cardiac Enzymes:  Lab 05/13/12 0902  CKTOTAL --  CKMB --  CKMBINDEX --  TROPONINI <0.30   BNP: No components found with this basename: POCBNP:5 CBG: No results found for this basename: GLUCAP:5 in the last 168 hours  Recent Results (from the past 240 hour(s))  SURGICAL PCR SCREEN     Status: Normal   Collection Time   05/13/12  4:43 PM      Component Value Range Status Comment   MRSA, PCR NEGATIVE  NEGATIVE Final    Staphylococcus aureus NEGATIVE  NEGATIVE Final      Scheduled Meds:   . sodium chloride   Intravenous STAT  . ALPRAZolam  0.25 mg Oral BID  . aspirin EC  81 mg Oral Daily  . calcium-vitamin D  1 tablet Oral BID WC  .  ceFAZolin (ANCEF) IV  1 g Intravenous Q8H  . docusate sodium  100 mg Oral BID  . donepezil  10 mg Oral QHS  . enoxaparin (LOVENOX) injection  30 mg Subcutaneous  Q24H  . escitalopram  10 mg Oral Daily  . ferrous sulfate  325 mg Oral Q breakfast  . influenza  inactive virus vaccine  0.5 mL Intramuscular Tomorrow-1000  . levothyroxine  125 mcg Oral Q breakfast  . losartan  50 mg Oral Daily  . memantine  10 mg Oral BID  . multivitamin with minerals  1 tablet Oral Daily  . pantoprazole  40 mg Oral Q1200  . psyllium  1 packet Oral BID  . senna  1 tablet Oral BID  . DISCONTD:  ceFAZolin (ANCEF) IV  1 g Intravenous Q8H  . DISCONTD: enoxaparin (LOVENOX) injection  40 mg Subcutaneous Q24H   Continuous Infusions:    Amarea Macdowell, DO  [facility] Pager [phone]  If 7PM-7AM, please contact night-coverage [email or web address] Password [email or web address] 05/14/2012, 8:00 PM   LOS: 1 day

## 2012-05-15 ENCOUNTER — Inpatient Hospital Stay (HOSPITAL_COMMUNITY): Payer: Medicare Other

## 2012-05-15 ENCOUNTER — Encounter (HOSPITAL_COMMUNITY): Payer: Self-pay | Admitting: Orthopedic Surgery

## 2012-05-15 DIAGNOSIS — D649 Anemia, unspecified: Secondary | ICD-10-CM

## 2012-05-15 DIAGNOSIS — G8190 Hemiplegia, unspecified affecting unspecified side: Secondary | ICD-10-CM

## 2012-05-15 DIAGNOSIS — M19029 Primary osteoarthritis, unspecified elbow: Secondary | ICD-10-CM | POA: Diagnosis not present

## 2012-05-15 DIAGNOSIS — R4182 Altered mental status, unspecified: Secondary | ICD-10-CM | POA: Diagnosis not present

## 2012-05-15 DIAGNOSIS — I517 Cardiomegaly: Secondary | ICD-10-CM | POA: Diagnosis not present

## 2012-05-15 DIAGNOSIS — S72143A Displaced intertrochanteric fracture of unspecified femur, initial encounter for closed fracture: Secondary | ICD-10-CM | POA: Diagnosis not present

## 2012-05-15 DIAGNOSIS — R0902 Hypoxemia: Secondary | ICD-10-CM

## 2012-05-15 DIAGNOSIS — G9389 Other specified disorders of brain: Secondary | ICD-10-CM | POA: Diagnosis not present

## 2012-05-15 LAB — BASIC METABOLIC PANEL
BUN: 28 mg/dL — ABNORMAL HIGH (ref 6–23)
CO2: 23 mEq/L (ref 19–32)
Chloride: 103 mEq/L (ref 96–112)
GFR calc non Af Amer: 36 mL/min — ABNORMAL LOW (ref >90)
Glucose, Bld: 117 mg/dL — ABNORMAL HIGH (ref 70–99)
Potassium: 4.2 mEq/L (ref 3.5–5.1)

## 2012-05-15 LAB — BLOOD GAS, ARTERIAL
Drawn by: 295031
FIO2: 0.21 %
pCO2 arterial: 33.2 mmHg — ABNORMAL LOW (ref 35.0–45.0)
pH, Arterial: 7.46 — ABNORMAL HIGH (ref 7.350–7.450)
pO2, Arterial: 46.5 mmHg — ABNORMAL LOW (ref 80.0–100.0)

## 2012-05-15 LAB — URINALYSIS, MICROSCOPIC ONLY
Hgb urine dipstick: NEGATIVE
Ketones, ur: NEGATIVE mg/dL
Protein, ur: 30 mg/dL — AB
Urobilinogen, UA: 0.2 mg/dL (ref 0.0–1.0)

## 2012-05-15 LAB — FERRITIN: Ferritin: 534 ng/mL — ABNORMAL HIGH (ref 10–291)

## 2012-05-15 LAB — TYPE AND SCREEN
Antibody Screen: NEGATIVE
Unit division: 0

## 2012-05-15 LAB — CBC
HCT: 30.2 % — ABNORMAL LOW (ref 36.0–46.0)
Hemoglobin: 10.3 g/dL — ABNORMAL LOW (ref 12.0–15.0)
MCHC: 34.1 g/dL (ref 30.0–36.0)

## 2012-05-15 LAB — IRON AND TIBC: UIBC: 186 ug/dL (ref 125–400)

## 2012-05-15 MED ORDER — FERROUS SULFATE 325 (65 FE) MG PO TABS
325.0000 mg | ORAL_TABLET | Freq: Three times a day (TID) | ORAL | Status: DC
  Administered 2012-05-15 – 2012-05-16 (×3): 325 mg via ORAL
  Filled 2012-05-15 (×6): qty 1

## 2012-05-15 MED ORDER — SODIUM CHLORIDE 0.9 % IV SOLN
125.0000 mg | Freq: Once | INTRAVENOUS | Status: AC
  Administered 2012-05-16: 125 mg via INTRAVENOUS
  Filled 2012-05-15 (×2): qty 10

## 2012-05-15 MED ORDER — TRAMADOL HCL 50 MG PO TABS: 50.0000 mg | ORAL_TABLET | Freq: Four times a day (QID) | ORAL | Status: DC | PRN

## 2012-05-15 MED ORDER — XENON XE 133 GAS
10.0000 | GAS_FOR_INHALATION | Freq: Once | RESPIRATORY_TRACT | Status: AC | PRN
  Administered 2012-05-15: 10 via RESPIRATORY_TRACT

## 2012-05-15 MED ORDER — TECHNETIUM TO 99M ALBUMIN AGGREGATED
6.0000 | Freq: Once | INTRAVENOUS | Status: AC | PRN
  Administered 2012-05-15: 6 via INTRAVENOUS

## 2012-05-15 MED ORDER — ACETAMINOPHEN 325 MG PO TABS
650.0000 mg | ORAL_TABLET | Freq: Four times a day (QID) | ORAL | Status: DC | PRN
  Administered 2012-05-15 – 2012-05-16 (×2): 650 mg via ORAL
  Filled 2012-05-15 (×2): qty 2

## 2012-05-15 NOTE — Progress Notes (Signed)
Physical Therapy Treatment Patient Details Name: [name] MRN: [id number] DOB: 17-Jun-1920 Today's Date: 05/15/2012 Time: 8295-6213 PT Time Calculation (min): 31 min  PT Assessment / Plan / Recommendation Comments on Treatment Session  Pt was very lethargic and difficult to arouse.  Patient stated that she needed to go to the bathroom.  Patient was transferred to the commode with max assist.  Patient was able to state pain in right hip region as well as soreness in right upper extremity around IV site.  After patient was positioned on commode, patient again became very lethargic.  Continual assist was needed to guard and Cue patient to ensure patient did not fall off commode.  Patient had difficulty following commands but was able to place hands on rails of commode with VC's.  When not moving, patient was pleasent.  Patient was transferred to chair and positioned semi reclined with legs elevated.  Pt stated that she was comfortable.  Patient was able to initiate a fwd lean in chair using arm rests upon command, but was unable to follow commands throughout threatment session.  Pending progress, patient will need 24 hr assist.  Rec SNF at discharge.    Follow Up Recommendations  Skilled nursing facility;Supervision/Assistance - 24 hour    Barriers to Discharge        Equipment Recommendations  Defer to next venue    Recommendations for Other Services    Frequency Min 3X/week   Plan Discharge plan remains appropriate    Precautions / Restrictions Precautions Precautions: Fall Restrictions Weight Bearing Restrictions: Yes RLE Weight Bearing: Weight bearing as tolerated   Pertinent Vitals/Pain Pain score not verbalized    Mobility  Bed Mobility Bed Mobility: Supine to Sit;Sitting - Scoot to Edge of Bed Supine to Sit: 1: +2 Total assist;2: Max assist Supine to Sit: Patient Percentage: 10% Sitting - Scoot to Edge of Bed: 1: +2 Total assist;2: Max assist Sitting - Scoot to [place] of  Bed: Patient Percentage: 10% Details for Bed Mobility Assistance: Pt agitated with movement, and significantly lethargic; minimal improvements in arrousal upon sitting edge of bed, but arousal unsustained Transfers Transfers: Heritage manager Transfers: 1: +2 Total assist;2: Max Designer, television/film set Transfers: Patient Percentage: 20% Details for Transfer Assistance: Patient able to provide very minimal assist, and required full max to lift and pivot Ambulation/Gait Ambulation/Gait Assistance: Not tested (comment)      PT Goals Acute Rehab PT Goals PT Goal: Supine/Side to Sit - Progress: Not progressing PT Goal: Sit to Stand - Progress: Not progressing  Visit Information  Last PT Received On: 05/15/12 Assistance Needed: +2    Subjective Data  Subjective: Pt difficult to arrouse Patient Stated Goal: none stated; Daughter in law states goal is to go to SNF   Cognition  Overall Cognitive Status: Impaired Area of Impairment: Attention;Memory;Safety/judgement;Following commands Arousal/Alertness: Lethargic Orientation Level: Disoriented to;Person;Place;Situation;Time Behavior During Session: Lethargic Current Attention Level: Other (comment) (patient response to stimulus, but difficult to arrouse) Cognition - Other Comments: Pt was able to depict pain with movement, and state that she needed to go to the bathroom    Balance     End of Session PT - End of Session Equipment Utilized During Treatment: Gait belt Activity Tolerance: Patient limited by pain;Patient limited by fatigue Patient left: in chair;with call bell/phone within reach;with family/visitor present;with nursing in room Nurse Communication: Mobility status   GP     Fabio Asa 05/15/2012, 3:37 PM Charlotte Crumb, PT DPT  [phone]   

## 2012-05-15 NOTE — Progress Notes (Signed)
Clinical Social Work   CSW met with patient and dtr at bedside. Dtr had more questions regarding SNF placement. Patient has been at ALF [facility]) for about 3 years and was independent with exception to bathing. Patient used a cane occasionally at ALF. Dtr [facility] and then return to ALF. Dtr prefers [facility] but willing to go to [place] as alternative option. CSW explained SNF benefits in regards to [facility] payment. CSW faxed out patient via Providerlink due to [facility] website being down. CSW will follow up with bed offers.  [place], [place] [phone] (Coverage for Lovette Cliche)

## 2012-05-15 NOTE — Progress Notes (Signed)
[name]  MRN: [id number] DOB/Age: 03-26-1920 78 y.o. Physician: Jacquelyne Balint Procedure: Procedure(s) (LRB): INTRAMEDULLARY (IM) NAIL FEMORAL (Right)     Subjective: Very different mood and affect today. Disoriented and agitated and paranoid this am. Nursing reports she has been this way all night  Vital Signs Temp:  [97.7 F (36.5 C)-100.1 F (37.8 C)] 99 F (37.2 C) (09/24 0515) Pulse Rate:  [69-92] 91  (09/24 0515) Resp:  [18-22] 18  (09/24 0515) BP: (97-142)/(41-67) 142/67 mmHg (09/24 0515) SpO2:  [90 %-99 %] 91 % (09/24 0515)  Lab Results  Basename 05/15/12 0530 05/14/12 0500  WBC 13.0* 10.0  HGB 10.3* 7.3*  HCT 30.2* 22.1*  PLT 129* 153   BMET  Basename 05/15/12 0530 05/14/12 0500  NA 138 136  K 4.2 4.2  CL 103 105  CO2 23 20  GLUCOSE 117* 147*  BUN 28* 31*  CREATININE 1.27* 1.36*  CALCIUM 8.7 8.2*   INR  Date Value Range Status  05/13/2012 1.18  0.00 - 1.49 Final     Exam Right hip dressing with no further drainage, old blood noted. NVI and moves foot on command        Plan Imp S/p ORIF R hip Sundowning/exacerbation of dementia Anemia- improved after transfusion  Continue with attempts at PT/OT Dressing change today  [facility] for Dr.Kevin Supple 05/15/2012, 8:28 AM     2

## 2012-05-15 NOTE — Progress Notes (Signed)
Shift event:  RN paged NP with MRI head results. NP reviewed results noting small SDH without mass effect. NP paged [facility], Dr. Phoebe Perch, who reviewed films. Dr. Phoebe Perch stated SDH was only about a mm thick, very small, and required no intervention. Dr. Phoebe Perch stated that if her PCP wants to, can recheck MRI in 3 mos to ensure no growth of SDH or that it is not changing to chronic SDH. Pt is on 81mg  ASA and Lovenox. Will report to attending MD to consider SCDs to replace Lovenox (?).  Maren Reamer, NP [facility]

## 2012-05-15 NOTE — Progress Notes (Signed)
[facility] PROGRESS NOTE  [name] [name]:[id number] DOB: 27-Mar-1920 DOA: 05/13/2012 PCP: [name], MD  Assessment/Plan: Right intertrochanteric femur fracture  -No syncope, secondary to mechanical fall  -Appreciate orthopedic surgery  S/p ORIF right hip (9/22)  -DVT prophylaxis after surgery  -PT/OT after surgery  New left hemiparesis -CT brain without contrast due to recent fall and scalp laceration -MRI brain without gadolinium -Increase aspirin to 325 mg daily temporarily if CT brain without bleed Hypoxemia -Chest x-ray, ABG Anemia, iron deficiency -Ferric gluconate IV, increased ferrous sulfate to 3 times a day -2 units packed red blood cells transfused(9/23)  -History of GI bleed, may need endoscopy if hemoglobin drops again Thrombocytopenia -May be related to Lovenox -Continue for now, but may need to switch to rivaroxaban for DVT prophylaxis after ORIF of the hip if no bleeding. Confusion/lethargy -Discontinue opioids -Start acetaminophen and tramadol Hypertension  -Continue losartan  -Well controlled  Dementia  -Continue Aricept and Namenda  -Continue Xanax for agitation  Hypothyroidism  -Check TSH, continue Synthroid  Macular degeneration      Family Communication:   Daughter at bedside Disposition Plan:   Home when medically stable     Procedures/Studies: Dg Chest 1 View  05/13/2012  *RADIOLOGY REPORT*  Clinical Data: Fall, right hip pain  CHEST - 1 VIEW  Comparison: 09/30/2011  Findings: Cardiomediastinal silhouette is stable.  Hyperinflation again noted.  No acute infiltrate or pulmonary edema.  Again noted extensive degenerative changes left shoulder. Atherosclerotic calcifications of the thoracic aorta.  IMPRESSION: No active disease.  Hyperinflation again noted.  Again noted extensive degenerative changes left shoulder.   Original Report Authenticated By: Natasha Mead, M.D.    Dg Elbow Complete Right  05/13/2012  *RADIOLOGY REPORT*   Clinical Data: Fall, hematoma  RIGHT ELBOW - COMPLETE 3+ VIEW  Comparison: 03/06/2010  Findings: Three views of the right elbow submitted.  No acute fracture or subluxation.  No posterior fat pad sign.  Soft tissue swelling noted dorsal olecranon region.  IMPRESSION: No acute fracture or subluxation.  No posterior fat pad sign.  Soft tissue swelling dorsal olecranon region.   Original Report Authenticated By: Natasha Mead, M.D.    Dg Hip Complete Right  05/13/2012  *RADIOLOGY REPORT*  Clinical Data: Fall, right hip pain  RIGHT HIP - COMPLETE 2+ VIEW  Comparison: 06/04/2009  Findings: Three views of the right hip submitted.  There is a comminuted displaced intertrochanteric and subtrochanteric fracture of the proximal right femur.  The right femoral head is still located in the right acetabulum. The lesser femoral trochanter fragment is displaced medially.  IMPRESSION: Comminuted displaced intertrochanteric and subtrochanteric fracture of proximal right femur.   Original Report Authenticated By: Natasha Mead, M.D.    Dg Hip Operative Right  05/13/2012  *RADIOLOGY REPORT*  Clinical Data: 78 year old female undergoing ORIF right hip.  DG OPERATIVE RIGHT HIP  Comparison: Preoperative films 0916 hours the same day.  Fluoroscopy time of 0.8 minutes was utilized.  Findings: Four intraoperative fluoroscopic views of the proximal right femur.  Intramedullary rod and interlocking proximal dynamic hip screw traverse the comminuted intertrochanteric fracture. Distal interlocking cortical screw.  Hardware appears intact. Improved alignment about the proximal femur fracture.  IMPRESSION: ORIF right proximal femur with no adverse features.   Original Report Authenticated By: Harley Hallmark, M.D.    Dg Femur Right  05/13/2012  *RADIOLOGY REPORT*  Clinical Data: History of fall complaining of right-sided hip pain.  RIGHT FEMUR - 2 VIEW  Comparison: No  priors.  Findings: Multiple views of the right femur demonstrate a comminuted  intertrochanteric right femoral neck fracture (see dedicated report from the right hip for full details).  Distal femur is intact.  IMPRESSION: 1.  Comminuted intertrochanteric right hip fracture.   Original Report Authenticated By: Florencia Reasons, M.D.    Ct Head Wo Contrast  05/13/2012  *RADIOLOGY REPORT*  Clinical Data:  History of trauma from a fall.  CT HEAD WITHOUT CONTRAST CT CERVICAL SPINE WITHOUT CONTRAST  Technique:  Multidetector CT imaging of the head and cervical spine was performed following the standard protocol without intravenous contrast.  Multiplanar CT image reconstructions of the cervical spine were also generated.  Comparison:  Head CT 10/17/2011.  C-spine CT 03/06/2010.  CT HEAD  Findings: Moderate cerebral and cerebellar atrophy is age appropriate.  There are patchy areas of decreased attenuation throughout the deep and periventricular white matter of the cerebral hemispheres bilaterally, compatible with chronic microvascular ischemic changes. No acute displaced skull fractures are identified.  No acute intracranial abnormality.  Specifically, no evidence of acute post-traumatic intracranial hemorrhage, no definite regions of acute/subacute cerebral ischemia, no focal mass, mass effect, hydrocephalus or abnormal intra or extra-axial fluid collections.  The visualized paranasal sinuses and mastoids are well pneumatized.  IMPRESSION: 1.  No acute displaced skull fractures or acute intracranial abnormalities. 2.  The appearance of brain is similar to prior examinations with moderate cerebral and cerebellar atrophy and chronic microvascular ischemic changes in the cerebral white matter.  CT CERVICAL SPINE  Findings: No acute displaced fractures of the cervical spine are noted.  There is severe multilevel degenerative disc disease, most pronounced at C3-C4, C4-C5, C5-C6 and C6-C7.  There is also severe multilevel facet arthropathy, most pronounced on the right at the C4-C5 the level where  the facets are fused.  3 mm of anterolisthesis of C6 on C7 is noted.  Prevertebral soft tissues are normal.  Visualized portions of the upper thorax are remarkable for some pleural parenchymal thickening in the lung apices bilaterally, compatible with scarring.  IMPRESSION: 1.  No evidence of significant acute traumatic injury to the cervical spine. 2.  Severe multilevel degenerative disc disease and cervical spondylosis, as detailed above, including 3 mm of anterolisthesis of C6 on C7, which is unchanged compared to prior study 03/06/2010.   Original Report Authenticated By: Florencia Reasons, M.D.    Ct Cervical Spine Wo Contrast  05/13/2012  *RADIOLOGY REPORT*  Clinical Data:  History of trauma from a fall.  CT HEAD WITHOUT CONTRAST CT CERVICAL SPINE WITHOUT CONTRAST  Technique:  Multidetector CT imaging of the head and cervical spine was performed following the standard protocol without intravenous contrast.  Multiplanar CT image reconstructions of the cervical spine were also generated.  Comparison:  Head CT 10/17/2011.  C-spine CT 03/06/2010.  CT HEAD  Findings: Moderate cerebral and cerebellar atrophy is age appropriate.  There are patchy areas of decreased attenuation throughout the deep and periventricular white matter of the cerebral hemispheres bilaterally, compatible with chronic microvascular ischemic changes. No acute displaced skull fractures are identified.  No acute intracranial abnormality.  Specifically, no evidence of acute post-traumatic intracranial hemorrhage, no definite regions of acute/subacute cerebral ischemia, no focal mass, mass effect, hydrocephalus or abnormal intra or extra-axial fluid collections.  The visualized paranasal sinuses and mastoids are well pneumatized.  IMPRESSION: 1.  No acute displaced skull fractures or acute intracranial abnormalities. 2.  The appearance of brain is similar to prior examinations with moderate cerebral  and cerebellar atrophy and chronic  microvascular ischemic changes in the cerebral white matter.  CT CERVICAL SPINE  Findings: No acute displaced fractures of the cervical spine are noted.  There is severe multilevel degenerative disc disease, most pronounced at C3-C4, C4-C5, C5-C6 and C6-C7.  There is also severe multilevel facet arthropathy, most pronounced on the right at the C4-C5 the level where the facets are fused.  3 mm of anterolisthesis of C6 on C7 is noted.  Prevertebral soft tissues are normal.  Visualized portions of the upper thorax are remarkable for some pleural parenchymal thickening in the lung apices bilaterally, compatible with scarring.  IMPRESSION: 1.  No evidence of significant acute traumatic injury to the cervical spine. 2.  Severe multilevel degenerative disc disease and cervical spondylosis, as detailed above, including 3 mm of anterolisthesis of C6 on C7, which is unchanged compared to prior study 03/06/2010.   Original Report Authenticated By: Florencia Reasons, M.D.          Subjective: Patient was agitated last night. Currently, she is drowsy but arousable. She answers questions. She denies any chest pain, shortness breath, nausea, vomiting, abdominal pain, dizziness. She has intermittent headache.  Objective: Filed Vitals:   05/15/12 0845 05/15/12 1020 05/15/12 1241 05/15/12 1321  BP: 132/57 123/64 101/66 108/47  Pulse: 86  91 87  Temp: 98.8 F (37.1 C)  94.6 F (34.8 C) 97.8 F (36.6 C)  TempSrc:      Resp: 17  18 18   Height:      Weight:      SpO2: 90%  93% 90%    Intake/Output Summary (Last 24 hours) at 05/15/12 1539 Last data filed at 05/15/12 0800  Gross per 24 hour  Intake 391.67 ml  Output    200 ml  Net 191.67 ml   Weight change:  Exam:   General:  Pt is alert, follows commands appropriately, not in acute distress  HEENT: No icterus, No thrush, No neck mass, [place]/AT  Cardiovascular: RRR, S1/S2, no rubs, no gallops  Respiratory: Clear to auscultation bilaterally, no  wheezing, no crackles, no rhonchi  Abdomen: Soft/+BS, non tender, non distended, no guarding  Extremities: No edema, No lymphangitis, No petechiae, No rashes, no synovitis  Data Reviewed: Basic Metabolic Panel:  Lab 05/15/12 1610 05/14/12 0500 05/13/12 0841  NA 138 136 134*  K 4.2 4.2 4.0  CL 103 105 100  CO2 23 20 25   GLUCOSE 117* 147* 90  BUN 28* 31* 26*  CREATININE 1.27* 1.36* 1.29*  CALCIUM 8.7 8.2* 9.6  MG -- -- --  PHOS -- -- --   Liver Function Tests:  Lab 05/13/12 0841  AST 21  ALT 14  ALKPHOS 69  BILITOT 0.4  PROT 6.9  ALBUMIN 4.1   No results found for this basename: LIPASE:5,AMYLASE:5 in the last 168 hours No results found for this basename: AMMONIA:5 in the last 168 hours CBC:  Lab 05/15/12 0530 05/14/12 0500 05/13/12 0841  WBC 13.0* 10.0 7.0  NEUTROABS -- -- 4.3  HGB 10.3* 7.3* 12.3  HCT 30.2* 22.1* 36.6  MCV 91.2 96.5 96.3  PLT 129* 153 208   Cardiac Enzymes:  Lab 05/13/12 0902  CKTOTAL --  CKMB --  CKMBINDEX --  TROPONINI <0.30   BNP: No components found with this basename: POCBNP:5 CBG: No results found for this basename: GLUCAP:5 in the last 168 hours  Recent Results (from the past 240 hour(s))  SURGICAL PCR SCREEN     Status: Normal  Collection Time   05/13/12  4:43 PM      Component Value Range Status Comment   MRSA, PCR NEGATIVE  NEGATIVE Final    Staphylococcus aureus NEGATIVE  NEGATIVE Final      Scheduled Meds:   . ALPRAZolam  0.25 mg Oral BID  . aspirin EC  81 mg Oral Daily  . calcium-vitamin D  1 tablet Oral BID WC  .  ceFAZolin (ANCEF) IV  1 g Intravenous Q8H  . docusate sodium  100 mg Oral BID  . donepezil  10 mg Oral QHS  . enoxaparin (LOVENOX) injection  30 mg Subcutaneous Q24H  . escitalopram  10 mg Oral Daily  . ferric gluconate (FERRLECIT/NULECIT) IV  125 mg Intravenous Once  . ferrous sulfate  325 mg Oral TID WC  . influenza  inactive virus vaccine  0.5 mL Intramuscular Tomorrow-1000  . levothyroxine  125  mcg Oral Q breakfast  . losartan  50 mg Oral Daily  . memantine  10 mg Oral BID  . multivitamin with minerals  1 tablet Oral Daily  . pantoprazole  40 mg Oral Q1200  . psyllium  1 packet Oral BID  . senna  1 tablet Oral BID  . DISCONTD: ferrous sulfate  325 mg Oral Q breakfast   Continuous Infusions:    Kadian Barcellos, DO  [facility] Pager [phone]  If 7PM-7AM, please contact night-coverage [email or web address] Password [facility] 05/15/2012, 3:39 PM   LOS: 2 days

## 2012-05-16 ENCOUNTER — Inpatient Hospital Stay
Admission: RE | Admit: 2012-05-16 | Discharge: 2012-05-21 | Disposition: A | Discharge status: Nursing Home (Includes ICF) | Payer: Medicare Other | Source: Ambulatory Visit | Attending: Internal Medicine | Admitting: Internal Medicine

## 2012-05-16 DIAGNOSIS — Z90710 Acquired absence of both cervix and uterus: Secondary | ICD-10-CM | POA: Diagnosis not present

## 2012-05-16 DIAGNOSIS — G9340 Encephalopathy, unspecified: Secondary | ICD-10-CM | POA: Diagnosis not present

## 2012-05-16 DIAGNOSIS — I6200 Nontraumatic subdural hemorrhage, unspecified: Secondary | ICD-10-CM | POA: Diagnosis not present

## 2012-05-16 DIAGNOSIS — S065X9A Traumatic subdural hemorrhage with loss of consciousness of unspecified duration, initial encounter: Secondary | ICD-10-CM | POA: Diagnosis not present

## 2012-05-16 DIAGNOSIS — Z853 Personal history of malignant neoplasm of breast: Secondary | ICD-10-CM | POA: Diagnosis not present

## 2012-05-16 DIAGNOSIS — Z792 Long term (current) use of antibiotics: Secondary | ICD-10-CM | POA: Diagnosis not present

## 2012-05-16 DIAGNOSIS — M6281 Muscle weakness (generalized): Secondary | ICD-10-CM | POA: Diagnosis not present

## 2012-05-16 DIAGNOSIS — I10 Essential (primary) hypertension: Secondary | ICD-10-CM | POA: Diagnosis not present

## 2012-05-16 DIAGNOSIS — Z79899 Other long term (current) drug therapy: Secondary | ICD-10-CM | POA: Diagnosis not present

## 2012-05-16 DIAGNOSIS — Z5189 Encounter for other specified aftercare: Secondary | ICD-10-CM | POA: Diagnosis not present

## 2012-05-16 DIAGNOSIS — S72143A Displaced intertrochanteric fracture of unspecified femur, initial encounter for closed fracture: Secondary | ICD-10-CM | POA: Diagnosis not present

## 2012-05-16 DIAGNOSIS — D649 Anemia, unspecified: Secondary | ICD-10-CM | POA: Diagnosis not present

## 2012-05-16 DIAGNOSIS — R0902 Hypoxemia: Secondary | ICD-10-CM | POA: Diagnosis not present

## 2012-05-16 DIAGNOSIS — M79609 Pain in unspecified limb: Secondary | ICD-10-CM | POA: Diagnosis not present

## 2012-05-16 DIAGNOSIS — K259 Gastric ulcer, unspecified as acute or chronic, without hemorrhage or perforation: Secondary | ICD-10-CM | POA: Diagnosis present

## 2012-05-16 DIAGNOSIS — M810 Age-related osteoporosis without current pathological fracture: Secondary | ICD-10-CM

## 2012-05-16 DIAGNOSIS — N289 Disorder of kidney and ureter, unspecified: Secondary | ICD-10-CM | POA: Diagnosis present

## 2012-05-16 DIAGNOSIS — I998 Other disorder of circulatory system: Secondary | ICD-10-CM | POA: Diagnosis present

## 2012-05-16 DIAGNOSIS — Z881 Allergy status to other antibiotic agents status: Secondary | ICD-10-CM | POA: Diagnosis not present

## 2012-05-16 DIAGNOSIS — S0990XA Unspecified injury of head, initial encounter: Secondary | ICD-10-CM | POA: Diagnosis not present

## 2012-05-16 DIAGNOSIS — J438 Other emphysema: Secondary | ICD-10-CM | POA: Diagnosis not present

## 2012-05-16 DIAGNOSIS — W19XXXA Unspecified fall, initial encounter: Secondary | ICD-10-CM | POA: Diagnosis not present

## 2012-05-16 DIAGNOSIS — Z9010 Acquired absence of unspecified breast and nipple: Secondary | ICD-10-CM | POA: Diagnosis not present

## 2012-05-16 DIAGNOSIS — E039 Hypothyroidism, unspecified: Secondary | ICD-10-CM | POA: Diagnosis present

## 2012-05-16 DIAGNOSIS — E860 Dehydration: Secondary | ICD-10-CM | POA: Diagnosis present

## 2012-05-16 DIAGNOSIS — S72009A Fracture of unspecified part of neck of unspecified femur, initial encounter for closed fracture: Secondary | ICD-10-CM | POA: Diagnosis not present

## 2012-05-16 DIAGNOSIS — IMO0002 Reserved for concepts with insufficient information to code with codable children: Secondary | ICD-10-CM | POA: Diagnosis not present

## 2012-05-16 DIAGNOSIS — F0390 Unspecified dementia without behavioral disturbance: Secondary | ICD-10-CM | POA: Diagnosis present

## 2012-05-16 DIAGNOSIS — G8190 Hemiplegia, unspecified affecting unspecified side: Secondary | ICD-10-CM | POA: Diagnosis not present

## 2012-05-16 DIAGNOSIS — D509 Iron deficiency anemia, unspecified: Secondary | ICD-10-CM | POA: Diagnosis present

## 2012-05-16 DIAGNOSIS — Z8781 Personal history of (healed) traumatic fracture: Secondary | ICD-10-CM | POA: Diagnosis not present

## 2012-05-16 DIAGNOSIS — Z9181 History of falling: Secondary | ICD-10-CM | POA: Diagnosis not present

## 2012-05-16 DIAGNOSIS — D62 Acute posthemorrhagic anemia: Secondary | ICD-10-CM | POA: Diagnosis present

## 2012-05-16 DIAGNOSIS — Z66 Do not resuscitate: Secondary | ICD-10-CM | POA: Diagnosis present

## 2012-05-16 DIAGNOSIS — Z4789 Encounter for other orthopedic aftercare: Secondary | ICD-10-CM | POA: Diagnosis not present

## 2012-05-16 DIAGNOSIS — R509 Fever, unspecified: Secondary | ICD-10-CM | POA: Diagnosis not present

## 2012-05-16 DIAGNOSIS — S72009D Fracture of unspecified part of neck of unspecified femur, subsequent encounter for closed fracture with routine healing: Secondary | ICD-10-CM | POA: Diagnosis not present

## 2012-05-16 LAB — URINE CULTURE

## 2012-05-16 MED ORDER — HYDROCODONE-ACETAMINOPHEN 5-500 MG PO TABS: 1.0000 | ORAL_TABLET | Freq: Three times a day (TID) | ORAL | Status: DC | PRN

## 2012-05-16 MED ORDER — CIPROFLOXACIN HCL 500 MG PO TABS
500.0000 mg | ORAL_TABLET | Freq: Every day | ORAL | Status: DC
  Administered 2012-05-16: 500 mg via ORAL
  Filled 2012-05-16: qty 1

## 2012-05-16 MED ORDER — ENSURE COMPLETE PO LIQD: 237.0000 mL | ORAL | Status: DC | PRN

## 2012-05-16 MED ORDER — CIPROFLOXACIN HCL 500 MG PO TABS: 500.0000 mg | ORAL_TABLET | Freq: Every day | ORAL | Status: DC

## 2012-05-16 MED ORDER — ALPRAZOLAM 0.25 MG PO TABS: 0.2500 mg | ORAL_TABLET | Freq: Two times a day (BID) | ORAL | Status: DC

## 2012-05-16 MED ORDER — SENNA 8.6 MG PO TABS: 1.0000 | ORAL_TABLET | Freq: Two times a day (BID) | ORAL | Status: DC

## 2012-05-16 NOTE — Progress Notes (Addendum)
Clinical Social Work Department CLINICAL SOCIAL WORK PLACEMENT NOTE 05/16/2012  Patient:  [name], [name]  Account Number:  [id number] Admit date:  05/13/2012  Clinical Social Worker:  Unk Lightning, LCSW  Date/time:  05/16/2012 03:00 PM  Clinical Social Work is seeking post-discharge placement for this patient at the following level of care:   SKILLED NURSING   (*CSW will update this form in Epic as items are completed)   05/16/2012  Patient/family provided with [facility] Department of Clinical Social Work's list of facilities offering this level of care within the geographic area requested by the patient (or if unable, by the patient's family).  05/16/2012  Patient/family informed of their freedom to choose among providers that offer the needed level of care, that participate in Medicare, Medicaid or managed care program needed by the patient, have an available bed and are willing to accept the patient.  05/16/2012  Patient/family informed of [facility]' ownership interest in [facility], as well as of the fact that they are under no obligation to receive care at this facility.  PASARR submitted to EDS on  Existing # PASARR number received from EDS on   FL2 transmitted to all facilities in geographic area requested by pt/family on  05/16/2012 FL2 transmitted to all facilities within larger geographic area on   Patient informed that his/her managed care company has contracts with or will negotiate with  certain facilities, including the following:    NA   Patient/family informed of bed offers received:  05/16/12 Patient chooses bed at  [facility] Physician recommends and patient chooses bed at   NA  Patient to be transferred to [facility]  on  05/16/12 Patient to be transferred to facility by ambulance [facility])  The following physician request were entered in Epic:   Additional Comments: OK per MD for d/c to SNF today. Ok per patient, her son and wife.   Notified SNF and pt's nurse- Ivar Drape of d/c today.  Lorri Frederick. [place]  [phone]

## 2012-05-16 NOTE — Discharge Summary (Addendum)
[facility]                                                                                   [name], is a 78 y.o. female  DOB 1919/09/16  MRN [id number].  Admission date:  05/13/2012  Discharge Date:  05/16/2012  Primary MD  [name], MD  Admitting Physician  Hollice Espy, MD  Admission Diagnosis  Fall [E888.9] Intertrochanteric fracture of right hip [820.21] Fall fx  Discharge Diagnosis     Active Problems:  Hypothyroidism  Anemia  Hemiparesis  Hypoxemia    Past Medical History  Diagnosis Date  . Thyroid disease   . Hypertension   . Dementia   . Anemia   . Renal disorder   . Gastric ulcer     gi bleed secondary    Past Surgical History  Procedure Date  . Thyroid surgery   . Mastectomy   . Carpal tunnel release   . Abdominal hysterectomy   . Femur im nail 05/13/2012    Procedure: INTRAMEDULLARY (IM) NAIL FEMORAL;  Surgeon: Senaida Lange, MD;  Location: [facility] OR;  Service: Orthopedics;  Laterality: Right;       Discharge Diagnoses:   Active Problems:  Hypothyroidism  Anemia  Hemiparesis  Hypoxemia    Discharge Condition: Stable   Diet recommendation: See Discharge Instructions below   Consults Ortho, N Surg over phone   History of present illness and  Hospital Course:  See H&P, Labs, Consult and Test reports for all details in brief, patient was admitted for mechanical fall causing right-sided hip fracture status post open reduction internal fixation on 05/13/2012, patient still requires considerable PT OT for regaining her strength and mobility, unfortunately during her mechanical fall patient had hit her left side of the head and per MRI has incurred a small subdural bleed, case was discussed by neurosurgeon Dr. Phoebe Perch by me personally today prior to discharge, at this time no aspirin or Lovenox after a week 81 mg of aspirin can be resumed, patient will need a repeat MRI of the brain along with dedicated IAC  protocol to rule out left-sided schwannoma she will then need to follow with neurosurgeon in 2-3 weeks time after her repeat MRI. Discussed the case with orthopedic PA clear for discharge from their standpoint. She should should wear SCDs when she is not mobile at all times for the next 6 weeks.   Patient has underlying dementia, macular degeneration, anemia of iron deficiency and chronic thrombocytopenia all appear to be stable will continue her home medications with outpatient primary care physician followup.   She has hypothyroidism for which home dose Synthroid will be continued repeat TSH in a week .   She was also found to be iron deficient for which she received IV iron this admission, also postop blood loss related anemia on top of anemia of chronic disease and iron deficiency which required 2 units of packed RBC transfusion on 05/14/2012.   Her UA was suggestive of mild UTI for which I have placed her on ciprofloxacin total of 6 doses, 1 dose given today morning.      Today   Subjective:  [name] today has no headache,no chest abdominal pain,no new weakness tingling or numbness, feels much better.  Objective:   Blood pressure 153/64, pulse 82, temperature 98.3 F (36.8 C), temperature source Oral, resp. rate 16, height 5\' 1"  (1.549 m), weight 49.442 kg (109 lb), SpO2 90.00%.   Intake/Output Summary (Last 24 hours) at 05/16/12 1039 Last data filed at 05/15/12 1745  Gross per 24 hour  Intake      0 ml  Output     75 ml  Net    -75 ml    Exam Awake Alert, Oriented *3, No new F.N deficits, Normal affect [place].AT,PERRAL Supple Neck,No JVD, No cervical lymphadenopathy appriciated.  Symmetrical Chest wall movement, Good air movement bilaterally, CTAB RRR,No Gallops,Rubs or new Murmurs, No Parasternal Heave +ve B.Sounds, Abd Soft, Non tender, No organomegaly appriciated, No rebound -guarding or rigidity. No Cyanosis, Clubbing or edema, No new Rash or bruise  Data  Review   Major procedures and Radiology Reports - PLEASE review detailed and final reports for all details in brief -       Dg Chest 1 View  05/13/2012  *RADIOLOGY REPORT*  Clinical Data: Fall, right hip pain  CHEST - 1 VIEW  Comparison: 09/30/2011  Findings: Cardiomediastinal silhouette is stable.  Hyperinflation again noted.  No acute infiltrate or pulmonary edema.  Again noted extensive degenerative changes left shoulder. Atherosclerotic calcifications of the thoracic aorta.  IMPRESSION: No active disease.  Hyperinflation again noted.  Again noted extensive degenerative changes left shoulder.   Original Report Authenticated By: Natasha Mead, M.D.    Dg Elbow Complete Right  05/13/2012  *RADIOLOGY REPORT*  Clinical Data: Fall, hematoma  RIGHT ELBOW - COMPLETE 3+ VIEW  Comparison: 03/06/2010  Findings: Three views of the right elbow submitted.  No acute fracture or subluxation.  No posterior fat pad sign.  Soft tissue swelling noted dorsal olecranon region.  IMPRESSION: No acute fracture or subluxation.  No posterior fat pad sign.  Soft tissue swelling dorsal olecranon region.   Original Report Authenticated By: Natasha Mead, M.D.    Dg Hip Complete Right  05/13/2012  *RADIOLOGY REPORT*  Clinical Data: Fall, right hip pain  RIGHT HIP - COMPLETE 2+ VIEW  Comparison: 06/04/2009  Findings: Three views of the right hip submitted.  There is a comminuted displaced intertrochanteric and subtrochanteric fracture of the proximal right femur.  The right femoral head is still located in the right acetabulum. The lesser femoral trochanter fragment is displaced medially.  IMPRESSION: Comminuted displaced intertrochanteric and subtrochanteric fracture of proximal right femur.   Original Report Authenticated By: Natasha Mead, M.D.    Dg Hip Operative Right  05/13/2012  *RADIOLOGY REPORT*  Clinical Data: 78 year old female undergoing ORIF right hip.  DG OPERATIVE RIGHT HIP  Comparison: Preoperative films 0916 hours the  same day.  Fluoroscopy time of 0.8 minutes was utilized.  Findings: Four intraoperative fluoroscopic views of the proximal right femur.  Intramedullary rod and interlocking proximal dynamic hip screw traverse the comminuted intertrochanteric fracture. Distal interlocking cortical screw.  Hardware appears intact. Improved alignment about the proximal femur fracture.  IMPRESSION: ORIF right proximal femur with no adverse features.   Original Report Authenticated By: Harley Hallmark, M.D.    Dg Femur Right  05/13/2012  *RADIOLOGY REPORT*  Clinical Data: History of fall complaining of right-sided hip pain.  RIGHT FEMUR - 2 VIEW  Comparison: No priors.  Findings: Multiple views of the right femur demonstrate a comminuted intertrochanteric right femoral neck fracture (  see dedicated report from the right hip for full details).  Distal femur is intact.  IMPRESSION: 1.  Comminuted intertrochanteric right hip fracture.   Original Report Authenticated By: Florencia Reasons, M.D.    Ct Head Wo Contrast  05/15/2012  *RADIOLOGY REPORT*  Clinical Data: 78 year old female recent fall.  New left hemipareses and altered mental status.  CT HEAD WITHOUT CONTRAST  Technique:  Contiguous axial images were obtained from the base of the skull through the vertex without contrast.  Comparison: Head CT 05/13/2012 and earlier.  Findings: Visualized paranasal sinuses and mastoids are clear.  No acute orbit or scalp soft tissue finding.  Calvarium intact.  Stable gray-white matter differentiation throughout the brain.  No evidence of cortically based acute infarction identified.  No midline shift, mass effect, or evidence of mass lesion.  No ventriculomegaly. No acute intracranial hemorrhage identified.  No suspicious intracranial vascular hyperdensity.  Patchy cerebral white matter hypodensity not significantly changed.  IMPRESSION: Stable noncontrast CT appearance the brain with no acute cortically based infarct or intracranial  hemorrhage identified.   Original Report Authenticated By: Harley Hallmark, M.D.    Ct Head Wo Contrast  05/13/2012  *RADIOLOGY REPORT*  Clinical Data:  History of trauma from a fall.  CT HEAD WITHOUT CONTRAST CT CERVICAL SPINE WITHOUT CONTRAST  Technique:  Multidetector CT imaging of the head and cervical spine was performed following the standard protocol without intravenous contrast.  Multiplanar CT image reconstructions of the cervical spine were also generated.  Comparison:  Head CT 10/17/2011.  C-spine CT 03/06/2010.  CT HEAD  Findings: Moderate cerebral and cerebellar atrophy is age appropriate.  There are patchy areas of decreased attenuation throughout the deep and periventricular white matter of the cerebral hemispheres bilaterally, compatible with chronic microvascular ischemic changes. No acute displaced skull fractures are identified.  No acute intracranial abnormality.  Specifically, no evidence of acute post-traumatic intracranial hemorrhage, no definite regions of acute/subacute cerebral ischemia, no focal mass, mass effect, hydrocephalus or abnormal intra or extra-axial fluid collections.  The visualized paranasal sinuses and mastoids are well pneumatized.  IMPRESSION: 1.  No acute displaced skull fractures or acute intracranial abnormalities. 2.  The appearance of brain is similar to prior examinations with moderate cerebral and cerebellar atrophy and chronic microvascular ischemic changes in the cerebral white matter.  CT CERVICAL SPINE  Findings: No acute displaced fractures of the cervical spine are noted.  There is severe multilevel degenerative disc disease, most pronounced at C3-C4, C4-C5, C5-C6 and C6-C7.  There is also severe multilevel facet arthropathy, most pronounced on the right at the C4-C5 the level where the facets are fused.  3 mm of anterolisthesis of C6 on C7 is noted.  Prevertebral soft tissues are normal.  Visualized portions of the upper thorax are remarkable for some  pleural parenchymal thickening in the lung apices bilaterally, compatible with scarring.  IMPRESSION: 1.  No evidence of significant acute traumatic injury to the cervical spine. 2.  Severe multilevel degenerative disc disease and cervical spondylosis, as detailed above, including 3 mm of anterolisthesis of C6 on C7, which is unchanged compared to prior study 03/06/2010.   Original Report Authenticated By: Florencia Reasons, M.D.    Ct Cervical Spine Wo Contrast  05/13/2012  *RADIOLOGY REPORT*  Clinical Data:  History of trauma from a fall.  CT HEAD WITHOUT CONTRAST CT CERVICAL SPINE WITHOUT CONTRAST  Technique:  Multidetector CT imaging of the head and cervical spine was performed following the standard protocol without  intravenous contrast.  Multiplanar CT image reconstructions of the cervical spine were also generated.  Comparison:  Head CT 10/17/2011.  C-spine CT 03/06/2010.  CT HEAD  Findings: Moderate cerebral and cerebellar atrophy is age appropriate.  There are patchy areas of decreased attenuation throughout the deep and periventricular white matter of the cerebral hemispheres bilaterally, compatible with chronic microvascular ischemic changes. No acute displaced skull fractures are identified.  No acute intracranial abnormality.  Specifically, no evidence of acute post-traumatic intracranial hemorrhage, no definite regions of acute/subacute cerebral ischemia, no focal mass, mass effect, hydrocephalus or abnormal intra or extra-axial fluid collections.  The visualized paranasal sinuses and mastoids are well pneumatized.  IMPRESSION: 1.  No acute displaced skull fractures or acute intracranial abnormalities. 2.  The appearance of brain is similar to prior examinations with moderate cerebral and cerebellar atrophy and chronic microvascular ischemic changes in the cerebral white matter.  CT CERVICAL SPINE  Findings: No acute displaced fractures of the cervical spine are noted.  There is severe multilevel  degenerative disc disease, most pronounced at C3-C4, C4-C5, C5-C6 and C6-C7.  There is also severe multilevel facet arthropathy, most pronounced on the right at the C4-C5 the level where the facets are fused.  3 mm of anterolisthesis of C6 on C7 is noted.  Prevertebral soft tissues are normal.  Visualized portions of the upper thorax are remarkable for some pleural parenchymal thickening in the lung apices bilaterally, compatible with scarring.  IMPRESSION: 1.  No evidence of significant acute traumatic injury to the cervical spine. 2.  Severe multilevel degenerative disc disease and cervical spondylosis, as detailed above, including 3 mm of anterolisthesis of C6 on C7, which is unchanged compared to prior study 03/06/2010.   Original Report Authenticated By: Florencia Reasons, M.D.    Mr Brain Wo Contrast  05/15/2012  *RADIOLOGY REPORT*  Clinical Data: 79 year old female with new left hemipareses.  MRI HEAD WITHOUT CONTRAST  Technique:  Multiplanar, multiecho pulse sequences of the brain and surrounding structures were obtained according to standard protocol without intravenous contrast.  Comparison: Head CT without contrast 1821 hours the same day and earlier.  Findings: No restricted diffusion in the brain parenchyma to suggest acute infarct.  However, there is diffusion artifact associated with a trace left side subdural hematoma which is visible along the posterior left temporal lobe and layering in the left occiput (series 7 image 9).  Thickness is up to 3 mm.  No significant mass effect.  No intraventricular hemorrhage.  No other intracranial hemorrhage or extra-axial collection identified.  Major intracranial vascular flow voids are preserved.  Cerebral volume loss.  No midline shift or other intracranial mass lesion. Negative pituitary, cervicomedullary junction and cervical spine for age.  Mild for age cerebral white matter T2 and FLAIR hyperintensity.  Deep gray matter nuclei, brainstem and cerebellum  are within normal limits for age.  Postoperative changes to the globes.  Minor paranasal sinus mucosal thickening.  Mastoids are clear.  Questionable loss of the normal T2 signal associated with the left IAC and cochlea.  Visualized bone marrow signal is within normal limits.  Negative scalp soft tissues.  IMPRESSION: 1.  No acute infarct identified, but the study is positive for a trace left side subdural hematoma.  No significant mass effect. 2.  No other acute intracranial abnormality. 3.  Questionable signal changes associated with the left internal auditory canal, could be artifact.  The possibility of a left vestibular schwannoma is not excluded.  Follow-up brain MRI with  dedicated IAC protocol would confirm.  Impression #1 called to RN Erenest Rasher at 2107 hours on 05/15/2012.   Original Report Authenticated By: Harley Hallmark, M.D.    Nm Pulmonary Perf And Vent  05/16/2012  *RADIOLOGY REPORT*  Clinical Data:  78 year old female with severe hypoxia following surgery.  NUCLEAR MEDICINE VENTILATION - PERFUSION LUNG SCAN  Technique:  Wash-in, equilibrium, and wash-out phase ventilation images were obtained using Xe-133 gas.  Perfusion images were obtained in multiple projections after intravenous injection of Tc- 60m MAA.  Radiopharmaceuticals:  10.0 mCi Xe-133 gas and 6.0 mCi Tc-41m MAA.  Comparison:  Portable chest radiograph 05/15/2012.  Findings: Comparison chest radiograph demonstrates  no focal pulmonary opacity.  Ventilation imaging demonstrates Some artifact from the respiratory apparatus is noted.  There is diffuse gas trapping in both lungs. No ventilation defect.  Perfusion imaging demonstrates heterogeneous radiotracer distribution in the lungs, but no segmental defects. No mismatched defects.  IMPRESSION: Low probability of acute pulmonary embolus.   Original Report Authenticated By: Harley Hallmark, M.D.    Dg Chest Port 1 View  05/15/2012  *RADIOLOGY REPORT*  Clinical Data: Hypoxemia   PORTABLE CHEST - 1 VIEW  Comparison: 05/13/2012 and 08/01/2009  Findings: There is mild cardiomegaly.  Stable mild prominence of the central pulmonary arteries.  The lungs are clear.  No pleural effusion or pneumothorax is visible.  There extensive degenerative changes of the left humeral head.  The humeral head contour is irregular. There is apparent shortening of the distal left clavicle, stable. No acute bony abnormalities identified.  IMPRESSION:  1.  No acute cardiopulmonary disease.  Stable mild cardiomegaly. 2. Extensive degenerative/arthrographic changes of the left humeral head.  The humeral head is markedly irregular and somewhat flattened.   Original Report Authenticated By: Britta Mccreedy, M.D.     Micro Results      Recent Results (from the past 240 hour(s))  SURGICAL PCR SCREEN     Status: Normal   Collection Time   05/13/12  4:43 PM      Component Value Range Status Comment   MRSA, PCR NEGATIVE  NEGATIVE Final    Staphylococcus aureus NEGATIVE  NEGATIVE Final      CBC w Diff: Lab Results  Component Value Date   WBC 13.0* 05/15/2012   HGB 10.3* 05/15/2012   HCT 30.2* 05/15/2012   PLT 129* 05/15/2012   LYMPHOPCT 22 05/13/2012   MONOPCT 11 05/13/2012   EOSPCT 6* 05/13/2012   BASOPCT 0 05/13/2012    CMP: Lab Results  Component Value Date   NA 138 05/15/2012   K 4.2 05/15/2012   CL 103 05/15/2012   CO2 23 05/15/2012   BUN 28* 05/15/2012   CREATININE 1.27* 05/15/2012   PROT 6.9 05/13/2012   ALBUMIN 4.1 05/13/2012   BILITOT 0.4 05/13/2012   ALKPHOS 69 05/13/2012   AST 21 05/13/2012   ALT 14 05/13/2012  .   Discharge Instructions     Ok for WBAT and continue therapy for Strengthening to right hip May shower at this time Change dressing as needed to right hip.   Follow with Primary MD [name], MD in 7 days, you need a repeat MRI in 2 weeks of your brain along with IAC protocol and then follow with neurosurgeon has recommended below.   Get CBC, CMP, TSH, checked 7 days  by Primary MD and again as instructed by your Primary MD. Get a 2 view Chest X ray done next visit if you had  Pneumonia of Lung problems at the Hospital.  Get Medicines reviewed and adjusted.  Please request your Prim.MD to go over all Hospital Tests and Procedure/Radiological results at the follow up, please get all Hospital records sent to your Prim MD by signing hospital release before you go home.  Activity: As tolerated with Full fall precautions use walker/cane & assistance as needed   Diet: Heart healthy with full assistance and Aspiration precautions.  For Heart failure patients - Check your Weight same time everyday, if you gain over 2 pounds, or you develop in leg swelling, experience more shortness of breath or chest pain, call your Primary MD immediately. Follow Cardiac Low Salt Diet and 1.8 lit/day fluid restriction.  Disposition SNF  If you experience worsening of your admission symptoms, develop shortness of breath, life threatening emergency, suicidal or homicidal thoughts you must seek medical attention immediately by calling 911 or calling your MD immediately  if symptoms less severe.  You Must read complete instructions/literature along with all the possible adverse reactions/side effects for all the Medicines you take and that have been prescribed to you. Take any new Medicines after you have completely understood and accpet all the possible adverse reactions/side effects.   Do not drive and provide baby sitting services if your were admitted for syncope or siezures until you have seen by Primary MD or a Neurologist and advised to do so again.  Do not drive when taking Pain medications.    Do not take more than prescribed Pain, Sleep and Anxiety Medications  Special Instructions: If you have smoked or chewed Tobacco  in the last 2 yrs please stop smoking, stop any regular Alcohol  and or any Recreational drug use.  Wear Seat belts while driving.   Follow-up  Information    Follow up with Senaida Lange, MD. (Follow up in 3 weeks, call to arrange appt/time)    Contact information:   [facility] [address], [address] [place] [address] [phone]       Follow up with [name], MD. In 4 days.   Contact information:   [address] [place] [address] [phone]       Follow up with Clydene Fake, MD. Schedule an appointment as soon as possible for a visit in 2 weeks.   Contact information:   [address] [place] [address] [phone]            Discharge Medications     Medication List     As of 05/16/2012 10:39 AM    START taking these medications         ciprofloxacin 500 MG tablet   Commonly known as: CIPRO   Take 1 tablet (500 mg total) by mouth daily. For 5 more doses      feeding supplement Liqd   Take 237 mLs by mouth as needed (for nutritional supplementation).      HYDROcodone-acetaminophen 5-500 MG per tablet   Commonly known as: VICODIN   Take 1 tablet by mouth every 8 (eight) hours as needed for pain.      senna 8.6 MG Tabs   Commonly known as: SENOKOT   Take 1 tablet (8.6 mg total) by mouth 2 (two) times daily.      CONTINUE taking these medications         acetaminophen 500 MG tablet   Commonly known as: TYLENOL      ALPRAZolam 0.25 MG tablet  Commonly known as: XANAX   Take 1 tablet (0.25 mg total) by mouth 2 (two) times daily.      Benefiber Powd      Biotin 300 MCG Tabs      Calcium Citrate-Vitamin D 200-250 MG-UNIT Tabs      Daily Vite Tabs      donepezil 10 MG tablet   Commonly known as: ARICEPT      escitalopram 10 MG tablet   Commonly known as: LEXAPRO      ferrous sulfate 325 (65 FE) MG tablet      levothyroxine 125 MCG tablet   Commonly known as: SYNTHROID, LEVOTHROID      losartan 50 MG tablet   Commonly known as: COZAAR      Lutein 6 MG Caps      memantine 10 MG tablet    Commonly known as: NAMENDA      omeprazole 20 MG capsule   Commonly known as: PRILOSEC   Take 1 capsule (20 mg total) by mouth daily.      STOP taking these medications         aspirin EC 81 MG tablet          Where to get your medications    These are the prescriptions that you need to pick up.   You may get these medications from any pharmacy.         ALPRAZolam 0.25 MG tablet   HYDROcodone-acetaminophen 5-500 MG per tablet         Information on where to get these meds is not yet available. Ask your nurse or doctor.         ciprofloxacin 500 MG tablet   feeding supplement Liqd   senna 8.6 MG Tabs               Total Time in preparing paper work, data evaluation and todays exam - 35 minutes  Leroy Sea M.D on 05/16/2012 at 10:39 AM  [facility] Office  [phone]

## 2012-05-20 ENCOUNTER — Ambulatory Visit (HOSPITAL_COMMUNITY)
Admit: 2012-05-20 | Discharge: 2012-05-20 | Disposition: A | Discharge status: Home / Self Care | Payer: Medicare Other | Attending: Internal Medicine | Admitting: Internal Medicine

## 2012-05-20 DIAGNOSIS — M79609 Pain in unspecified limb: Secondary | ICD-10-CM | POA: Diagnosis not present

## 2012-05-21 ENCOUNTER — Inpatient Hospital Stay (HOSPITAL_COMMUNITY): Payer: Medicare Other

## 2012-05-21 ENCOUNTER — Encounter (HOSPITAL_COMMUNITY): Payer: Self-pay | Admitting: *Deleted

## 2012-05-21 ENCOUNTER — Inpatient Hospital Stay (HOSPITAL_COMMUNITY)
Admission: EM | Admit: 2012-05-21 | Discharge: 2012-05-24 | DRG: 811 | Disposition: A | Discharge status: Skilled Nursing Facility | Payer: Medicare Other | Attending: Internal Medicine | Admitting: Internal Medicine

## 2012-05-21 DIAGNOSIS — R509 Fever, unspecified: Secondary | ICD-10-CM | POA: Diagnosis not present

## 2012-05-21 DIAGNOSIS — Z792 Long term (current) use of antibiotics: Secondary | ICD-10-CM

## 2012-05-21 DIAGNOSIS — Z90710 Acquired absence of both cervix and uterus: Secondary | ICD-10-CM

## 2012-05-21 DIAGNOSIS — M25559 Pain in unspecified hip: Secondary | ICD-10-CM | POA: Diagnosis not present

## 2012-05-21 DIAGNOSIS — I998 Other disorder of circulatory system: Secondary | ICD-10-CM | POA: Diagnosis present

## 2012-05-21 DIAGNOSIS — Z5189 Encounter for other specified aftercare: Secondary | ICD-10-CM | POA: Diagnosis not present

## 2012-05-21 DIAGNOSIS — Z79899 Other long term (current) drug therapy: Secondary | ICD-10-CM

## 2012-05-21 DIAGNOSIS — Z9010 Acquired absence of unspecified breast and nipple: Secondary | ICD-10-CM | POA: Diagnosis not present

## 2012-05-21 DIAGNOSIS — E860 Dehydration: Secondary | ICD-10-CM | POA: Diagnosis present

## 2012-05-21 DIAGNOSIS — N289 Disorder of kidney and ureter, unspecified: Secondary | ICD-10-CM | POA: Diagnosis present

## 2012-05-21 DIAGNOSIS — Z881 Allergy status to other antibiotic agents status: Secondary | ICD-10-CM

## 2012-05-21 DIAGNOSIS — S72009D Fracture of unspecified part of neck of unspecified femur, subsequent encounter for closed fracture with routine healing: Secondary | ICD-10-CM | POA: Diagnosis not present

## 2012-05-21 DIAGNOSIS — G9340 Encephalopathy, unspecified: Secondary | ICD-10-CM

## 2012-05-21 DIAGNOSIS — Z87828 Personal history of other (healed) physical injury and trauma: Secondary | ICD-10-CM

## 2012-05-21 DIAGNOSIS — I10 Essential (primary) hypertension: Secondary | ICD-10-CM

## 2012-05-21 DIAGNOSIS — K319 Disease of stomach and duodenum, unspecified: Secondary | ICD-10-CM | POA: Diagnosis not present

## 2012-05-21 DIAGNOSIS — Z8781 Personal history of (healed) traumatic fracture: Secondary | ICD-10-CM | POA: Diagnosis not present

## 2012-05-21 DIAGNOSIS — D649 Anemia, unspecified: Secondary | ICD-10-CM | POA: Diagnosis not present

## 2012-05-21 DIAGNOSIS — R0902 Hypoxemia: Secondary | ICD-10-CM | POA: Diagnosis not present

## 2012-05-21 DIAGNOSIS — F0390 Unspecified dementia without behavioral disturbance: Secondary | ICD-10-CM | POA: Diagnosis present

## 2012-05-21 DIAGNOSIS — Z66 Do not resuscitate: Secondary | ICD-10-CM | POA: Diagnosis present

## 2012-05-21 DIAGNOSIS — D509 Iron deficiency anemia, unspecified: Secondary | ICD-10-CM | POA: Diagnosis present

## 2012-05-21 DIAGNOSIS — E039 Hypothyroidism, unspecified: Secondary | ICD-10-CM | POA: Diagnosis present

## 2012-05-21 DIAGNOSIS — Z8679 Personal history of other diseases of the circulatory system: Secondary | ICD-10-CM | POA: Diagnosis not present

## 2012-05-21 DIAGNOSIS — K259 Gastric ulcer, unspecified as acute or chronic, without hemorrhage or perforation: Secondary | ICD-10-CM | POA: Diagnosis present

## 2012-05-21 DIAGNOSIS — J438 Other emphysema: Secondary | ICD-10-CM | POA: Diagnosis not present

## 2012-05-21 DIAGNOSIS — Z853 Personal history of malignant neoplasm of breast: Secondary | ICD-10-CM | POA: Diagnosis not present

## 2012-05-21 DIAGNOSIS — S72143A Displaced intertrochanteric fracture of unspecified femur, initial encounter for closed fracture: Secondary | ICD-10-CM | POA: Diagnosis not present

## 2012-05-21 DIAGNOSIS — D62 Acute posthemorrhagic anemia: Principal | ICD-10-CM | POA: Diagnosis present

## 2012-05-21 DIAGNOSIS — Z4789 Encounter for other orthopedic aftercare: Secondary | ICD-10-CM | POA: Diagnosis not present

## 2012-05-21 DIAGNOSIS — S065XAA Traumatic subdural hemorrhage with loss of consciousness status unknown, initial encounter: Secondary | ICD-10-CM | POA: Diagnosis not present

## 2012-05-21 DIAGNOSIS — S0990XA Unspecified injury of head, initial encounter: Secondary | ICD-10-CM | POA: Diagnosis not present

## 2012-05-21 DIAGNOSIS — K279 Peptic ulcer, site unspecified, unspecified as acute or chronic, without hemorrhage or perforation: Secondary | ICD-10-CM | POA: Diagnosis present

## 2012-05-21 HISTORY — DX: Personal history of (healed) traumatic fracture: Z87.81

## 2012-05-21 HISTORY — DX: Personal history of other (healed) physical injury and trauma: Z87.828

## 2012-05-21 LAB — CBC WITH DIFFERENTIAL/PLATELET
Eosinophils Absolute: 0.5 10*3/uL (ref 0.0–0.7)
Eosinophils Relative: 5 % (ref 0–5)
HCT: 26.1 % — ABNORMAL LOW (ref 36.0–46.0)
Lymphocytes Relative: 9 % — ABNORMAL LOW (ref 12–46)
Lymphs Abs: 1 10*3/uL (ref 0.7–4.0)
MCH: 31.7 pg (ref 26.0–34.0)
MCV: 96.3 fL (ref 78.0–100.0)
Monocytes Absolute: 1.4 10*3/uL — ABNORMAL HIGH (ref 0.1–1.0)
RBC: 2.71 MIL/uL — ABNORMAL LOW (ref 3.87–5.11)
WBC: 10.5 10*3/uL (ref 4.0–10.5)

## 2012-05-21 LAB — BASIC METABOLIC PANEL
BUN: 33 mg/dL — ABNORMAL HIGH (ref 6–23)
CO2: 28 mEq/L (ref 19–32)
Calcium: 9.2 mg/dL (ref 8.4–10.5)
Creatinine, Ser: 1.11 mg/dL — ABNORMAL HIGH (ref 0.50–1.10)
Glucose, Bld: 95 mg/dL (ref 70–99)

## 2012-05-21 LAB — PREPARE RBC (CROSSMATCH)

## 2012-05-21 MED ORDER — ESCITALOPRAM OXALATE 10 MG PO TABS
10.0000 mg | ORAL_TABLET | Freq: Every day | ORAL | Status: DC
  Administered 2012-05-22 – 2012-05-24 (×3): 10 mg via ORAL
  Filled 2012-05-21 (×3): qty 1

## 2012-05-21 MED ORDER — ACETAMINOPHEN 650 MG RE SUPP: 650.0000 mg | Freq: Four times a day (QID) | RECTAL | Status: DC | PRN

## 2012-05-21 MED ORDER — LOSARTAN POTASSIUM 50 MG PO TABS
50.0000 mg | ORAL_TABLET | Freq: Every day | ORAL | Status: DC
  Administered 2012-05-22 – 2012-05-24 (×3): 50 mg via ORAL
  Filled 2012-05-21 (×3): qty 1

## 2012-05-21 MED ORDER — OXYCODONE HCL 5 MG PO TABS
5.0000 mg | ORAL_TABLET | ORAL | Status: DC | PRN
  Administered 2012-05-21 – 2012-05-23 (×7): 5 mg via ORAL
  Filled 2012-05-21 (×9): qty 1

## 2012-05-21 MED ORDER — ALBUTEROL SULFATE (5 MG/ML) 0.5% IN NEBU: 2.5000 mg | INHALATION_SOLUTION | RESPIRATORY_TRACT | Status: DC | PRN

## 2012-05-21 MED ORDER — ACETAMINOPHEN 325 MG PO TABS
650.0000 mg | ORAL_TABLET | Freq: Four times a day (QID) | ORAL | Status: DC | PRN
  Administered 2012-05-22: 650 mg via ORAL
  Filled 2012-05-21: qty 2

## 2012-05-21 MED ORDER — PANTOPRAZOLE SODIUM 40 MG IV SOLR
40.0000 mg | INTRAVENOUS | Status: DC
  Administered 2012-05-21 – 2012-05-22 (×2): 40 mg via INTRAVENOUS
  Filled 2012-05-21 (×2): qty 40

## 2012-05-21 MED ORDER — DONEPEZIL HCL 5 MG PO TABS
10.0000 mg | ORAL_TABLET | Freq: Every day | ORAL | Status: DC
  Administered 2012-05-21 – 2012-05-24 (×4): 10 mg via ORAL
  Filled 2012-05-21 (×4): qty 2

## 2012-05-21 MED ORDER — MEMANTINE HCL 10 MG PO TABS
10.0000 mg | ORAL_TABLET | Freq: Two times a day (BID) | ORAL | Status: DC
  Administered 2012-05-21 – 2012-05-24 (×6): 10 mg via ORAL
  Filled 2012-05-21 (×6): qty 1

## 2012-05-21 MED ORDER — ADULT MULTIVITAMIN W/MINERALS CH
1.0000 | ORAL_TABLET | Freq: Every day | ORAL | Status: DC
  Administered 2012-05-22 – 2012-05-24 (×3): 1 via ORAL
  Filled 2012-05-21 (×3): qty 1

## 2012-05-21 MED ORDER — ONDANSETRON HCL 4 MG/2ML IJ SOLN: 4.0000 mg | Freq: Four times a day (QID) | INTRAMUSCULAR | Status: DC | PRN

## 2012-05-21 MED ORDER — ONDANSETRON HCL 4 MG PO TABS: 4.0000 mg | ORAL_TABLET | Freq: Four times a day (QID) | ORAL | Status: DC | PRN

## 2012-05-21 MED ORDER — DEXTROSE-NACL 5-0.45 % IV SOLN
INTRAVENOUS | Status: AC
  Administered 2012-05-21: 22:00:00 via INTRAVENOUS

## 2012-05-21 MED ORDER — LEVOTHYROXINE SODIUM 25 MCG PO TABS
125.0000 ug | ORAL_TABLET | Freq: Every day | ORAL | Status: DC
  Administered 2012-05-22 – 2012-05-24 (×3): 125 ug via ORAL
  Filled 2012-05-21 (×3): qty 1

## 2012-05-21 MED ORDER — METAXALONE 800 MG PO TABS: 400.0000 mg | ORAL_TABLET | Freq: Three times a day (TID) | ORAL | Status: DC | PRN

## 2012-05-21 NOTE — Progress Notes (Signed)
Pt passed bedside swallow eval without difficulties.

## 2012-05-21 NOTE — ED Notes (Addendum)
Anemia Hgb 7.9?  Recent rt hip fx , treated at [facility],  Received blood transfusion, but cont to have low hgb..  To ER from [facility] center.

## 2012-05-21 NOTE — ED Notes (Signed)
Hold blood transfusion per Dr Adriana Simas after consulting hospitalist

## 2012-05-21 NOTE — H&P (Signed)
[name] is an 78 y.o. female.    PCP: [name], MD   Chief Complaint: Anemia, confusion, fever  HPI: This is a 78 year old, Caucasian female, who has a past medical history of dementia, breast cancer, hypothyroidism, hypertension, peptic ulcer disease, who underwent open reduction, internal fixation for right hip fracture earlier this month. This was done at [facility]. Patient was sent over to a skilled nursing facility for rehabilitation. During that hospitalization she was found to be anemic and required blood transfusion. She was also found to have a small subdural hematoma. Because of confusion she also underwent other testing, which revealed low probability for PE on the VQ scan. It was also felt that her confusion was secondary to narcotics and she was changed over to tramadol. She has been doing well in the skilled nursing facility. However, her appetite has been very poor. She has had visual hallucinations in the last few days. She was found to be talking to relative's who were not in the room. She was also diagnosed with a presumed UTI at [facility] and was discharged on ciprofloxacin. Urine cultures have not shown any growth. She was sent over to the ED because of a hemoglobin of 7.9. No overt bleeding was noted anywhere. Once in the ED, hemoglobin is found to be greater than 8. She is however, febrile. There is no history of cough. Patient unable to provide much history because of confusion. Her daughter-in-law is in the room and she was able to give me few answers. There is no history of any black stools or blood in the stools. History is somewhat limited.   Home Medications: Prior to Admission medications   Medication Sig Start Date End Date Taking? Authorizing Provider  acetaminophen (TYLENOL) 325 MG tablet Take 650 mg by mouth every 8 (eight) hours as needed. Pain or fever.   Yes Historical Provider, MD  acetaminophen (TYLENOL) 500 MG tablet Take 1,000 mg by mouth at  bedtime.   Yes Historical Provider, MD  acetaminophen (TYLENOL) 650 MG CR tablet Take 650 mg by mouth every 4 (four) hours as needed. Pain. Not to exceed 3,000 mg in 24 hours.   Yes Historical Provider, MD  ALPRAZolam (XANAX) 0.25 MG tablet Take 1 tablet (0.25 mg total) by mouth 2 (two) times daily. 05/16/12  Yes Leroy Sea, MD  Biotin 300 MCG TABS Take 600 mcg by mouth daily.   Yes Historical Provider, MD  calcium-vitamin D (OSCAL WITH D) 500-200 MG-UNIT per tablet Take 1 tablet by mouth daily.   Yes Historical Provider, MD  ciprofloxacin (CIPRO) 500 MG tablet Take 500 mg by mouth daily. For 5 more doses 05/16/12 05/21/12 Yes Leroy Sea, MD  donepezil (ARICEPT) 10 MG tablet Take 10 mg by mouth daily.    Yes Historical Provider, MD  escitalopram (LEXAPRO) 10 MG tablet Take 10 mg by mouth daily.   Yes Historical Provider, MD  feeding supplement (ENSURE COMPLETE) LIQD Take 237 mLs by mouth as needed (for nutritional supplementation). 05/16/12  Yes Leroy Sea, MD  ferrous sulfate 325 (65 FE) MG tablet Take 325 mg by mouth daily.   Yes Historical Provider, MD  levothyroxine (SYNTHROID, LEVOTHROID) 125 MCG tablet Take 125 mcg by mouth daily.   Yes Historical Provider, MD  losartan (COZAAR) 50 MG tablet Take 50 mg by mouth daily.   Yes Historical Provider, MD  Lutein 6 MG CAPS Take 1 capsule by mouth daily.   Yes Historical Provider, MD  magnesium hydroxide (MILK OF MAGNESIA) 400 MG/5ML suspension Take 30 mLs by mouth daily as needed. Constipation.   Yes Historical Provider, MD  memantine (NAMENDA) 10 MG tablet Take 10 mg by mouth 2 (two) times daily.   Yes Historical Provider, MD  metaxalone (SKELAXIN) 800 MG tablet Take 800 mg by mouth 3 (three) times daily as needed. Muscle spasms.   Yes Historical Provider, MD  Multiple Vitamin (DAILY VITE) TABS Take 1 tablet by mouth daily.   Yes Historical Provider, MD  omeprazole (PRILOSEC) 20 MG capsule Take 1 capsule (20 mg total) by mouth daily.  11/15/11  Yes Joselyn Arrow, NP  senna (SENOKOT) 8.6 MG TABS Take 1 tablet (8.6 mg total) by mouth 2 (two) times daily. 05/16/12  Yes Leroy Sea, MD  traMADol (ULTRAM) 50 MG tablet Take 50 mg by mouth every 6 (six) hours as needed. Pain.   Yes Historical Provider, MD  Wheat Dextrin (BENEFIBER) POWD Take 10 mLs by mouth 2 (two) times daily.   Yes Historical Provider, MD    Allergies:  Allergies  Allergen Reactions  . Amoxicillin-Pot Clavulanate     REACTION: UNKNOWN REACTION  . Cephalexin     REACTION: UNKNOWN REACTION    Past Medical History: Past Medical History  Diagnosis Date  . Thyroid disease   . Hypertension   . Dementia   . Anemia   . Gastric ulcer     gi bleed secondary  . Renal disorder     Past Surgical History  Procedure Date  . Thyroid surgery   . Mastectomy   . Carpal tunnel release   . Abdominal hysterectomy   . Femur im nail 05/13/2012    Procedure: INTRAMEDULLARY (IM) NAIL FEMORAL;  Surgeon: Senaida Lange, MD;  Location: [facility] OR;  Service: Orthopedics;  Laterality: Right;    Social History:  reports that she has never smoked. She does not have any smokeless tobacco history on file. She reports that she does not drink alcohol or use illicit drugs.  Family History: Patient and daughter-in-law denies any medical history in the family Review of Systems -  unobtainable from patient due to mental status  Physical Examination Blood pressure 156/88, pulse 80, temperature 101.7 F (38.7 C), temperature source Oral, resp. rate 16, height 5\' 1"  (1.549 m), weight 51.665 kg (113 lb 14.4 oz), SpO2 92.00%.  General appearance: cooperative, appears stated age, distracted, no distress and slowed mentation Head: Normocephalic, without obvious abnormality, atraumatic Eyes: conjunctivae/corneas clear. PERRL, EOM's intact.  Throat: lips, mucosa, and tongue normal; teeth and gums normal Neck: no adenopathy, no carotid bruit, no JVD, supple, symmetrical, trachea midline  and thyroid not enlarged, symmetric, no tenderness/mass/nodules Resp: There is decreased air entry at the bases. No definite crackles or wheezing. Cardio: S1, S2 is normal. Regular. Systolic murmur appreciated over the precordium. No S3, S4. No rubs, or bruits. GI: soft, non-tender; bowel sounds normal; no masses,  no organomegaly Extremities: Very tender even with minimal movement. The right thigh area is definitely more swollen compared to the left. There is ecchymosis seen. There is bruising seen over the groin area. Peripheral pulses are present. She was able to move her toes and her ankle on the right side. She would not allow full examination of her hip Pulses: 2+ and symmetric Skin: Skin color, texture, turgor normal. No rashes or lesions Lymph nodes: Cervical, supraclavicular, and axillary nodes normal. Neurologic: She is alert, but confused. Could not tell me where she was. She could  not to me the year or of the month. She did not have any other focal neurological deficits. Cranial nerves were intact.  Laboratory Data: Results for orders placed during the hospital encounter of 05/21/12 (from the past 48 hour(s))  CBC WITH DIFFERENTIAL     Status: Abnormal   Collection Time   05/21/12 12:25 PM      Component Value Range Comment   WBC 10.5  4.0 - 10.5 K/uL    RBC 2.71 (*) 3.87 - 5.11 MIL/uL    Hemoglobin 8.6 (*) 12.0 - 15.0 g/dL    HCT 16.1 (*) 09.6 - 46.0 %    MCV 96.3  78.0 - 100.0 fL    MCH 31.7  26.0 - 34.0 pg    MCHC 33.0  30.0 - 36.0 g/dL    RDW 04.5  40.9 - 81.1 %    Platelets 385  150 - 400 K/uL    Neutrophils Relative 72  43 - 77 %    Neutro Abs 7.6  1.7 - 7.7 K/uL    Lymphocytes Relative 9 (*) 12 - 46 %    Lymphs Abs 1.0  0.7 - 4.0 K/uL    Monocytes Relative 13 (*) 3 - 12 %    Monocytes Absolute 1.4 (*) 0.1 - 1.0 K/uL    Eosinophils Relative 5  0 - 5 %    Eosinophils Absolute 0.5  0.0 - 0.7 K/uL    Basophils Relative 0  0 - 1 %    Basophils Absolute 0.0  0.0 - 0.1  K/uL   BASIC METABOLIC PANEL     Status: Abnormal   Collection Time   05/21/12 12:25 PM      Component Value Range Comment   Sodium 133 (*) 135 - 145 mEq/L    Potassium 4.6  3.5 - 5.1 mEq/L    Chloride 97  96 - 112 mEq/L    CO2 28  19 - 32 mEq/L    Glucose, Bld 95  70 - 99 mg/dL    BUN 33 (*) 6 - 23 mg/dL    Creatinine, Ser 9.14 (*) 0.50 - 1.10 mg/dL    Calcium 9.2  8.4 - 78.2 mg/dL    GFR calc non Af Amer 42 (*) >90 mL/min    GFR calc Af Amer 49 (*) >90 mL/min   TYPE AND SCREEN     Status: Normal (Preliminary result)   Collection Time   05/21/12 12:25 PM      Component Value Range Comment   ABO/RH(D) O POS      Antibody Screen POS      Sample Expiration 05/24/2012      DAT, IgG POS      Antibody Identification ANTI-c      Antibody ID,T [place] ANTI-c      Unit Number [id number]      Blood Component Type RED CELLS,LR      Unit division 00      Status of Unit ALLOCATED      Donor AG Type NEGATIVE FOR c ANTIGEN      Transfusion Status OK TO TRANSFUSE      Crossmatch Result COMPATIBLE      Unit Number [id number]      Blood Component Type RED CELLS,LR      Unit division 00      Status of Unit ALLOCATED      Donor AG Type NEGATIVE FOR c ANTIGEN      Transfusion Status OK TO TRANSFUSE  Crossmatch Result COMPATIBLE     PREPARE RBC (CROSSMATCH)     Status: Normal   Collection Time   05/21/12 12:25 PM      Component Value Range Comment   Order Confirmation ORDER PROCESSED BY BLOOD BANK       Radiology Reports: US Venous Img Lower Unilateral Right  05/20/2012  *RADIOLOGY REPORT*  Clinical Data: Pain  RIGHT LOWER EXTREMITY VENOUS DUPLEX ULTRASOUND  Technique:  Gray-scale sonography with graded compression, as well as color Doppler and duplex ultrasound, were performed to evaluate the deep venous system of the lower extremity from the level of the common femoral vein through the popliteal and proximal calf veins. Spectral Doppler was utilized to evaluate flow at rest and  with distal augmentation maneuvers.  Comparison:  None.  Findings: The visualized right lower extremity deep venous system appears patent.  Normal compressibility.  Patent color Doppler flow.  Satisfactory spectral Doppler with respiratory variation and response to augmentation.  IMPRESSION: No deep venous thrombosis in the visualized right lower extremity.   Original Report Authenticated By: Charline Bills, M.D.      Assessment/Plan  Principal Problem:  *Fever Active Problems:  HYPERTENSION  PEPTIC ULCER DISEASE  Hypothyroidism  Anemia  Encephalopathy acute  Dementia  History of fracture of right hip  History of subdural hematoma (post traumatic)   #1 fever: Etiology is unclear. We will get a UA and a chest x-ray. Blood cultures will be obtained. There is no obvious induration or warmth or erythema noted in the right hip area. There is no meningeal signs. I will withhold her ciprofloxacin for now because of encephalopathy. Once we have a source for her fever appropriate therapy will be initiated. Gentle IV hydration will be provided for mild dehydration.  #2 anemia: No obvious blood loss was seen. She does have some ecchymosis associated with the right hip. There is some bruising in the groin area as well. We will proceed with a CAT scan of her right hip area. Hemoglobin will be monitored closely. At this time there is no urgent indication for transfusion.   #3 acute encephalopathy: It's likely multifactorial. Probably from fever or, ciprofloxacin, narcotics, recent surgery. We'll continue to monitor her closely. Fall precautions will utilized. Due to the detection of a small subdural hematoma recently we will proceed with a CT head. The subdural hematoma was discussed by the discharging physician with a neurosurgeon. A repeat MRI was recommended along with followup with the neurosurgeon in 2-3 weeks.  #4 Recent right hip fracture and repair: She unfortunately, hasn't been able to do much  rehabilitation according to the daughter-in-law. She still has significant pain in that leg. We will proceed with a CT scan as discussed above. PT and OT will be asked to see the patient. Doppler study ruled out a DVT today.  #4 history of dementia: Continue with Aricept and Namenda.  #5 history of peptic ulcer disease: Continue with a proton pump inhibitor.  CODE STATUS was discussed and she is a DO NOT RESUSCITATE.  DVT, prophylaxis with SCDs for now.  Further management and decisions will depend on results of further testing and patient's response to treatment.  Patient has fever of unknown origin. She has anemia of unknown source. She would require more than 2 midnights in the hospital for further evaluation. Prognosis is guarded.  [facility]  [facility] Pager [phone]  05/21/2012, 8:37 PM

## 2012-05-21 NOTE — ED Provider Notes (Signed)
History  This chart was scribed for Donnetta Hutching, MD by Ardeen Jourdain. This patient was seen in room APA04/APA04 and the patient's care was started at 1313.  CSN: [id number]  Arrival date & time 05/21/12  1101   First MD Initiated Contact with Patient 05/21/12 1313      Chief Complaint  Patient presents with  . Abnormal Lab    The history is provided by the patient. No language interpreter was used.    [name] is a 78 y.o. female who presents to the Emergency Department complaining of anemia. She states having fractured her hip 8 days ago, Dr. Rennis Chris operated on the hip that day. She was staying at [facility] and transferred to [facility]. Her daughter-in-law denies the pt having melena. Her hemoglobin was 7.9 and after treatment rose to 10.4. She was given 2 units of blood 1 week ago. She is originally from [place]. She had an insignificant cerebral bleed found in a recent MRI.  PCP: Dr. Leanord Hawking   Past Medical History  Diagnosis Date  . Thyroid disease   . Hypertension   . Dementia   . Anemia   . Gastric ulcer     gi bleed secondary  . Renal disorder     Past Surgical History  Procedure Date  . Thyroid surgery   . Mastectomy   . Carpal tunnel release   . Abdominal hysterectomy   . Femur im nail 05/13/2012    Procedure: INTRAMEDULLARY (IM) NAIL FEMORAL;  Surgeon: Senaida Lange, MD;  Location: [facility] OR;  Service: Orthopedics;  Laterality: Right;    History reviewed. No pertinent family history.  History  Substance Use Topics  . Smoking status: Never Smoker   . Smokeless tobacco: Not on file  . Alcohol Use: No    OB History    Grav Para Term Preterm Abortions TAB SAB Ect Mult Living                  Review of Systems  A complete 10 system review of systems was obtained and all systems are negative except as noted in the HPI and PMH.    Allergies  Amoxicillin-pot clavulanate and Cephalexin  Home Medications   Current Outpatient Rx  Name Route Sig  Dispense Refill  . ACETAMINOPHEN 325 MG PO TABS Oral Take 650 mg by mouth every 8 (eight) hours as needed. Pain or fever.    . ACETAMINOPHEN 500 MG PO TABS Oral Take 1,000 mg by mouth at bedtime.    . ACETAMINOPHEN ER 650 MG PO TBCR Oral Take 650 mg by mouth every 4 (four) hours as needed. Pain. Not to exceed 3,000 mg in 24 hours.    . ALPRAZOLAM 0.25 MG PO TABS Oral Take 1 tablet (0.25 mg total) by mouth 2 (two) times daily. 10 tablet 0  . BIOTIN 300 MCG PO TABS Oral Take 600 mcg by mouth daily.    Marland Kitchen CALCIUM CARBONATE-VITAMIN D 500-200 MG-UNIT PO TABS Oral Take 1 tablet by mouth daily.    Marland Kitchen CIPROFLOXACIN HCL 500 MG PO TABS Oral Take 500 mg by mouth daily. For 5 more doses    . DONEPEZIL HCL 10 MG PO TABS Oral Take 10 mg by mouth daily.     Marland Kitchen ESCITALOPRAM OXALATE 10 MG PO TABS Oral Take 10 mg by mouth daily.    Marland Kitchen ENSURE COMPLETE PO LIQD Oral Take 237 mLs by mouth as needed (for nutritional supplementation).    Di Kindle  SULFATE 325 (65 FE) MG PO TABS Oral Take 325 mg by mouth daily.    Marland Kitchen LEVOTHYROXINE SODIUM 125 MCG PO TABS Oral Take 125 mcg by mouth daily.    Marland Kitchen LOSARTAN POTASSIUM 50 MG PO TABS Oral Take 50 mg by mouth daily.    . LUTEIN 6 MG PO CAPS Oral Take 1 capsule by mouth daily.    Marland Kitchen MAGNESIUM HYDROXIDE 400 MG/5ML PO SUSP Oral Take 30 mLs by mouth daily as needed. Constipation.    Marland Kitchen MEMANTINE HCL 10 MG PO TABS Oral Take 10 mg by mouth 2 (two) times daily.    Marland Kitchen METAXALONE 800 MG PO TABS Oral Take 800 mg by mouth 3 (three) times daily as needed. Muscle spasms.    . DAILY VITE PO TABS Oral Take 1 tablet by mouth daily.    Marland Kitchen OMEPRAZOLE 20 MG PO CPDR Oral Take 1 capsule (20 mg total) by mouth daily. 31 capsule 11  . SENNA 8.6 MG PO TABS Oral Take 1 tablet (8.6 mg total) by mouth 2 (two) times daily. 120 each   . TRAMADOL HCL 50 MG PO TABS Oral Take 50 mg by mouth every 6 (six) hours as needed. Pain.    . BENEFIBER PO POWD Oral Take 10 mLs by mouth 2 (two) times daily.      Triage Vitals:  BP 125/85  Pulse 75  Temp 99.9 F (37.7 C) (Oral)  Resp 18  SpO2 97%  Physical Exam  Nursing note and vitals reviewed. Constitutional: She is oriented to person, place, and time. She appears well-developed and well-nourished.  HENT:  Head: Normocephalic and atraumatic.  Eyes: Conjunctivae normal and EOM are normal. Pupils are equal, round, and reactive to light.  Neck: Normal range of motion. Neck supple.  Cardiovascular: Normal rate, regular rhythm and normal heart sounds.   Pulmonary/Chest: Effort normal and breath sounds normal.  Abdominal: Soft. Bowel sounds are normal.  Musculoskeletal: Normal range of motion.       Right hip: 3 healing suture marks, puffy, ecchymotic   Neurological: She is alert and oriented to person, place, and time.       Slight cerebral lead  Skin: Skin is warm and dry.  Psychiatric: She has a normal mood and affect.    ED Course  Procedures (including critical care time)  DIAGNOSTIC STUDIES: Oxygen Saturation is 97% on room air, normal by my interpretation.    COORDINATION OF CARE:  1350- Discussed treatment plan with pt at bedside and pt agreed to plan. CBC and BMP were ordered.     Results for orders placed during the hospital encounter of 05/21/12  CBC WITH DIFFERENTIAL      Component Value Range   WBC 10.5  4.0 - 10.5 K/uL   RBC 2.71 (*) 3.87 - 5.11 MIL/uL   Hemoglobin 8.6 (*) 12.0 - 15.0 g/dL   HCT 82.9 (*) 56.2 - 13.0 %   MCV 96.3  78.0 - 100.0 fL   MCH 31.7  26.0 - 34.0 pg   MCHC 33.0  30.0 - 36.0 g/dL   RDW 86.5  78.4 - 69.6 %   Platelets 385  150 - 400 K/uL   Neutrophils Relative 72  43 - 77 %   Neutro Abs 7.6  1.7 - 7.7 K/uL   Lymphocytes Relative 9 (*) 12 - 46 %   Lymphs Abs 1.0  0.7 - 4.0 K/uL   Monocytes Relative 13 (*) 3 - 12 %   Monocytes Absolute  1.4 (*) 0.1 - 1.0 K/uL   Eosinophils Relative 5  0 - 5 %   Eosinophils Absolute 0.5  0.0 - 0.7 K/uL   Basophils Relative 0  0 - 1 %   Basophils Absolute 0.0  0.0 - 0.1 K/uL   BASIC METABOLIC PANEL      Component Value Range   Sodium 133 (*) 135 - 145 mEq/L   Potassium 4.6  3.5 - 5.1 mEq/L   Chloride 97  96 - 112 mEq/L   CO2 28  19 - 32 mEq/L   Glucose, Bld 95  70 - 99 mg/dL   BUN 33 (*) 6 - 23 mg/dL   Creatinine, Ser 9.56 (*) 0.50 - 1.10 mg/dL   Calcium 9.2  8.4 - 21.3 mg/dL   GFR calc non Af Amer 42 (*) >90 mL/min   GFR calc Af Amer 49 (*) >90 mL/min  TYPE AND SCREEN      Component Value Range   ABO/RH(D) O POS     Antibody Screen POS     Sample Expiration 05/24/2012    PREPARE RBC (CROSSMATCH)      Component Value Range   Order Confirmation ORDER PROCESSED BY BLOOD BANK         No diagnosis found.    MDM  Status post recent right hip fracture repair approximately one week ago. Patient was anemic postop requiring 2 units of blood.  Hemoglobin 8.6 today.  Patient sent here from [facility] for further evaluation. Discussed with hospitalist. Admit to observation      I personally performed the services described in this documentation, which was scribed in my presence. The recorded information has been reviewed and considered.    Donnetta Hutching, MD 05/21/12 [phone]

## 2012-05-21 NOTE — Progress Notes (Signed)
Pt's temp on arrival to unit 101.7.  Dr. Kerry Hough paged.

## 2012-05-22 DIAGNOSIS — I10 Essential (primary) hypertension: Secondary | ICD-10-CM | POA: Diagnosis not present

## 2012-05-22 DIAGNOSIS — R509 Fever, unspecified: Secondary | ICD-10-CM | POA: Diagnosis not present

## 2012-05-22 DIAGNOSIS — D649 Anemia, unspecified: Secondary | ICD-10-CM | POA: Diagnosis not present

## 2012-05-22 LAB — URINALYSIS, ROUTINE W REFLEX MICROSCOPIC
Glucose, UA: NEGATIVE mg/dL
Glucose, UA: NEGATIVE mg/dL
Hgb urine dipstick: NEGATIVE
Ketones, ur: NEGATIVE mg/dL
Nitrite: NEGATIVE
Protein, ur: NEGATIVE mg/dL
Protein, ur: NEGATIVE mg/dL
pH: 6 (ref 5.0–8.0)

## 2012-05-22 LAB — COMPREHENSIVE METABOLIC PANEL
AST: 17 U/L (ref 0–37)
BUN: 27 mg/dL — ABNORMAL HIGH (ref 6–23)
CO2: 26 mEq/L (ref 19–32)
Calcium: 8.6 mg/dL (ref 8.4–10.5)
Chloride: 98 mEq/L (ref 96–112)
Creatinine, Ser: 1.17 mg/dL — ABNORMAL HIGH (ref 0.50–1.10)
GFR calc non Af Amer: 40 mL/min — ABNORMAL LOW (ref >90)
Total Bilirubin: 0.8 mg/dL (ref 0.3–1.2)

## 2012-05-22 LAB — IRON AND TIBC
Saturation Ratios: 8 % — ABNORMAL LOW (ref 20–55)
TIBC: 154 ug/dL — ABNORMAL LOW (ref 250–470)

## 2012-05-22 LAB — CBC
Hemoglobin: 7.2 g/dL — ABNORMAL LOW (ref 12.0–15.0)
MCH: 31.6 pg (ref 26.0–34.0)
RBC: 2.28 MIL/uL — ABNORMAL LOW (ref 3.87–5.11)
WBC: 10.1 10*3/uL (ref 4.0–10.5)

## 2012-05-22 LAB — FOLATE: Folate: >20 ng/mL

## 2012-05-22 LAB — URINE MICROSCOPIC-ADD ON

## 2012-05-22 LAB — VITAMIN B12: Vitamin B-12: 454 pg/mL (ref 211–911)

## 2012-05-22 NOTE — Progress Notes (Signed)
Total of 60ml of Blood returned to lab, pt. Received of the blood.

## 2012-05-22 NOTE — Evaluation (Signed)
Physical Therapy Evaluation Patient Details Name: [name] MRN: [id number] DOB: Jan 11, 1920 Today's Date: 05/22/2012 Time: 4098-1191 PT Time Calculation (min): 33 min  PT Assessment / Plan / Recommendation Clinical Impression  Pt is seen for eval.  She is here from SNF where she is to rehab from OTIF of R hip fx.  Pt is very drowsy from pain med but does arouse.  She is unable to tolerate ANY touch to the RLE in order to assess ROM or strength.  She  has impaired cognition and it is difficult to ascertain what is real pain v.s. fear.    I did not push to try to get her OOBbecause of significant drowsiness.  We will follow daily in attempt to improve mobility, but she may strongly resist these efforts.         r    PT Assessment  Patient needs continued PT services    Follow Up Recommendations  Post acute inpatient rehab    Barriers to Discharge None      Equipment Recommendations  None recommended by PT    Recommendations for Other Services OT consult   Frequency Min 3X/week    Precautions / Restrictions Precautions Precautions: Fall Restrictions Weight Bearing Restrictions: No   Pertinent Vitals/Pain       Mobility  Bed Mobility Bed Mobility: Not assessed (pt will not allow any movement of her body)    Shoulder Instructions     Exercises General Exercises - Lower Extremity Ankle Circles/Pumps: AROM;Left;5 reps;Supine Heel Slides: AAROM;Left;10 reps;Supine Hip ABduction/ADduction: AAROM;Left;10 reps;Supine   PT Diagnosis: Difficulty walking;Abnormality of gait;Generalized weakness;Acute pain  PT Problem List: Decreased strength;Decreased range of motion;Decreased activity tolerance;Decreased mobility;Decreased cognition;Decreased knowledge of use of DME;Decreased safety awareness;Decreased knowledge of precautions;Pain PT Treatment Interventions: Functional mobility training;Therapeutic activities;Therapeutic exercise;Patient/family education   PT Goals Acute  Rehab PT Goals PT Goal Formulation: With family Time For Goal Achievement: 06/05/12 Potential to Achieve Goals: Poor Pt will go Supine/Side to Sit: with +2 total assist;with HOB not 0 degrees (comment degree) PT Goal: Supine/Side to Sit - Progress: Goal set today PT Goal: Sit to Stand - Progress: Discontinued (comment) Pt will Transfer Bed to Chair/Chair to Bed: with +2 total assist PT Transfer Goal: Bed to Chair/Chair to Bed - Progress: Goal set today PT Goal: Ambulate - Progress: Discontinued (comment)  Visit Information  Last PT Received On: 05/22/12    Subjective Data  Subjective: Pt states "I have a lot of arthritis" Patient Stated Goal: none stated   Prior Functioning  Home Living Type of Home: Skilled Nursing Facility Home Access: Level entry Additional Comments: Pt has been at SNF following OTIF of R hip...she still has her apt at [facility] Prior Function Level of Independence: Needs assistance Needs Assistance: Bathing;Dressing;Grooming;Toileting;Meal Prep;Light Housekeeping (not ambulatory since hip fx) Bath: Total Dressing: Total Grooming: Total Toileting: Total Meal Prep: Total Light Housekeeping: Total Able to Take Stairs?: No Driving: No Communication Communication: No difficulties    Cognition  Overall Cognitive Status: History of cognitive impairments - at baseline Arousal/Alertness: Lethargic Orientation Level: Disoriented X4;Place;Time;Situation Behavior During Session: Lethargic    Extremity/Trunk Assessment Right Upper Extremity Assessment RUE ROM/Strength/Tone: Deficits;Unable to fully assess RUE ROM/Strength/Tone Deficits: pt has a large ecchymosis on elbow and strength appears decreased overall, but ROM is WNL RUE Sensation: WFL - Light Touch Left Upper Extremity Assessment LUE ROM/Strength/Tone: Unable to fully assess;Due to pain (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) Right Lower Extremity Assessment RLE ROM/Strength/Tone:  Deficits;Unable to fully assess;Due to pain RLE ROM/Strength/Tone Deficits: pt screams with touch to foot...she would not allow any touch or ROM to RLE Left Lower Extremity Assessment LLE ROM/Strength/Tone: Deficits LLE ROM/Strength/Tone Deficits: it took some time, but with very gentle ROM we were able to achieve ROM to WNL in hip and knee...unable to eval strength due to non compliance LLE Sensation: WFL - Light Touch   Balance    End of Session PT - End of Session Activity Tolerance: Patient limited by fatigue;Patient limited by pain Patient left: in bed;with bed alarm set;with family/visitor present Nurse Communication: Mobility status  GP     Konrad Penta 05/22/2012, 11:49 AM

## 2012-05-22 NOTE — Clinical Social Work Psychosocial (Signed)
Clinical Social Work Department BRIEF PSYCHOSOCIAL ASSESSMENT 05/22/2012  Patient:  [name], [name]     Account Number:  [id number]     Admit date:  05/21/2012  Clinical Social Worker:  Nancie Neas  Date/Time:  05/22/2012 08:55 AM  Referred by:  CSW  Date Referred:  05/22/2012 Referred for  SNF Placement   Other Referral:   Interview type:  Family Other interview type:   Erskine Squibb- daughter-in-law    PSYCHOSOCIAL DATA Living Status:  FACILITY Admitted from facility:  [facility] Level of care:  Skilled Nursing Facility Primary support name:  Onalee Hua Primary support relationship to patient:  CHILD, ADULT Degree of support available:   very supportive    CURRENT CONCERNS Current Concerns  Post-Acute Placement   Other Concerns:    SOCIAL WORK ASSESSMENT / PLAN CSW spoke with pt's daughter-in-law regarding pt as pt is not oriented per Charity fundraiser. Pt's son also not available but voicemail left. Erskine Squibb states pt has been a resident at [facility] since last week. She previously had been a resident at [facility] for the past 3 years. A little over a week ago, she was getting ready for church and fell, fracturing her hip. Pt was transferred to [facility] for surgery and then to SNF on Wednesday last week. Family appear to be very involved and supportive and state they visit regularly. Per Tami at [facility], pt is skilled level of care and okay to return at d/c. No FL2 needed as returning same level of care. Family state they will do bed hold.   Assessment/plan status:  Psychosocial Support/Ongoing Assessment of Needs Other assessment/ plan:   Information/referral to community resources:   [facility]    PATIENT'S/FAMILY'S RESPONSE TO PLAN OF CARE: Pt unable to discuss plan of care at this time due to confusion. Family report positive feelings regarding return to [facility] when medically stable and will be completing bed hold today. CSW to continue to follow.        Derenda Fennel, [place] [phone]

## 2012-05-22 NOTE — Clinical Documentation Improvement (Signed)
Anemia Documentation Clarification Query  THIS DOCUMENT IS NOT A PERMANENT PART OF THE MEDICAL RECORD  RESPOND TO THE THIS QUERY, FOLLOW THE INSTRUCTIONS BELOW:  1. If needed, update documentation for the patient's encounter via the notes activity.  2. Access this query again and click edit on the In [employer].  3. After updating, or not, click F2 to complete all highlighted (required) fields concerning your review. Select "additional documentation in the medical record" OR "no additional documentation provided".  4. Click Sign note button.  5. The deficiency will fall out of your In Basket *Please let us know if you are not able to complete this workflow by phone or e-mail (listed below).          05/22/12  Dear Dr. Kerry Hough Marton Redwood  In an effort to better capture your patient's severity of illness, reflect appropriate length of stay and utilization of resources, a review of the patient medical record has revealed the following indicatoBased on your clinical judgment, please clarify and document in a progress note and/or discharge summary the clinical condition associated with the following supporting information.  In responding to this query please exercise your independent judgment.  The fact that a query is asked, does not imply that any particular answer is desired or expected.  Possible Clinical Conditions?  " Iron deficiency anemia " Chronic blood loss anemia " Anemia due to chronic disease " Acute Blood Loss Anemia " Precipitous drop in Hematocrit " Other Condition____________ " Cannot Clinically Determine   Clinical Information:   Signs and Symptoms: Hx of anemia Hx of IM Nailing  Advanced age  Diagnostics: H/H 9/24= 10.3/30.2 9/30= 8.6/26.1 10/1= 7.2/21.6 Fe studies: pending Treatments: Stool for occult blood  Transfusion: 2 units of PRBCs   You may use possible, probable, or suspect with inpatient documentation. Possible, probable, suspected diagnoses  MUST be documented at the time of discharge.  Reviewed: additional documentation in the medical record noted on 10/1pn by Dr. Kerry Hough.  Thank You,  Harless Litten RN, MSN Clinical Documentation Specialist: Office# [phone] [facility] Health Information Management [place]

## 2012-05-22 NOTE — Progress Notes (Signed)
Pt. Temp. 101.5 oral, Dr. Kerry Hough paged, Blood stopped , IV fluids started at NS at 50/hr, urine specimen obtained, Blood returned to lab.

## 2012-05-22 NOTE — Progress Notes (Signed)
[facility] PROGRESS NOTE  [name] RUE:[id number] DOB: [birth year] DOA: 05/21/2012 PCP: [name], MD  Assessment/Plan: Principal Problem:  *Fever Active Problems:  HYPERTENSION  PEPTIC ULCER DISEASE  Hypothyroidism  Anemia  Encephalopathy acute  Dementia  History of fracture of right hip  History of subdural hematoma (post traumatic)  1. Anemia, possibly due to acute blood loss. Patient has had an ongoing decline in her hemoglobin. Today she 7.2. She does have a history of peptic ulcer disease, but does not show any signs of active GI bleeding. There is no melena or hematochezia. We will check stool occult blood. She does have significant ecchymosis over her right hip and genital area. Her right thigh is also significantly swollen compared to left. Ultrasound Doppler of her right lower extremity was negative for DVT. CT scan done of the hip is also unremarkable. I wonder whether she is losing blood in her leg. We'll request an orthopedic consultation. The patient is known to Dr. Hilda Lias and family has requested that Dr. Hilda Lias be called. She'll be transfused 2 units of PRBCs today 2. Fever. Unclear etiology. Urinalysis does not show any signs of infection, blood cultures have shown no growth to date, chest x-ray does not show any pneumonia. Although her hip is very tender, does not appear to be any significant erythema or warmth. We'll continue to monitor for recurrence of fever. We'll hold off on antibiotics, she does not appear to be toxic. 3. Encephalopathy. Possibly related to medications. At this time she is awake, participating in conversation, we'll continue to follow 4. Recent right hip fracture. Patient will need to continue to undergo physical therapy. 5. Recent subdural hematoma. Patient will need followup MRI to be done in 3 months followed by neurosurgical followup to 3 weeks thereafter. She will be provided with SCDs for DVT prophylaxis. After discharge she  may start on aspirin. 6. Mild dehydration. Patient is receiving gentle hydration.  Code Status: DO NOT RESUSCITATE Family Communication: Discussed with patient and daughter Disposition Plan: We'll need to go back to skilled nursing facility on discharge   Brief narrative: This lady was sent to the hospital with low hemoglobin, confusion. She recently underwent hip fracture surgery at [facility] and was sent to the nursing center for therapy. It is noted that she had a declining hemoglobin and was becoming confused. She was evaluated in the emergency room and was found to be anemic. He was also noted to febrile. She was admitted to the hospital for evaluation.  Consultants:  None  Procedures:  None  Antibiotics:  None  HPI/Subjective: She says she does not feel well. Unable to tell me exactly what is wrong. Family says it's mostly pain in her hip. They have noticed that she has been increasingly weak and has not been participating with physical therapy.  Objective: Filed Vitals:   05/21/12 1838 05/21/12 2100 05/21/12 2200 05/22/12 0500  BP: 156/88 153/65  131/77  Pulse: 80 81  77  Temp: 101.7 F (38.7 C) 100.2 F (37.9 C) 99.5 F (37.5 C) 98.1 F (36.7 C)  TempSrc: Oral Oral  Oral  Resp: 16 18  16   Height: 5\' 1"  (1.549 m)     Weight: 51.665 kg (113 lb 14.4 oz)     SpO2: 92% 93%  93%   No intake or output data in the 24 hours ending 05/22/12 1355 Filed Weights   05/21/12 1838  Weight: 51.665 kg (113 lb 14.4 oz)    Exam:  General:  No acute distress  Cardiovascular: S1, S2, regular rhythm  Respiratory: Clear to auscultation bilaterally  Abdomen: Soft, nontender, nondistended, bowel sounds are active  Extremities: There is ecchymosis and tenderness around the right hip. Ecchymosis is also noted in the pubic/genital area.  No significant warmth over her hip is appreciated. Her right thigh is more swollen than her left thigh.  Data Reviewed: Basic Metabolic  Panel:  Lab 05/22/12 0528 05/21/12 1225  NA 132* 133*  K 4.1 4.6  CL 98 97  CO2 26 28  GLUCOSE 113* 95  BUN 27* 33*  CREATININE 1.17* 1.11*  CALCIUM 8.6 9.2  MG -- --  PHOS -- --   Liver Function Tests:  Lab 05/22/12 0528  AST 17  ALT 7  ALKPHOS 60  BILITOT 0.8  PROT 5.4*  ALBUMIN 2.4*   No results found for this basename: LIPASE:5,AMYLASE:5 in the last 168 hours No results found for this basename: AMMONIA:5 in the last 168 hours CBC:  Lab 05/22/12 0528 05/21/12 1225  WBC 10.1 10.5  NEUTROABS -- 7.6  HGB 7.2* 8.6*  HCT 21.6* 26.1*  MCV 94.7 96.3  PLT 376 385   Cardiac Enzymes: No results found for this basename: CKTOTAL:5,CKMB:5,CKMBINDEX:5,TROPONINI:5 in the last 168 hours BNP (last 3 results) No results found for this basename: PROBNP:3 in the last 8760 hours CBG: No results found for this basename: GLUCAP:5 in the last 168 hours  Recent Results (from the past 240 hour(s))  SURGICAL PCR SCREEN     Status: Normal   Collection Time   05/13/12  4:43 PM      Component Value Range Status Comment   MRSA, PCR NEGATIVE  NEGATIVE Final    Staphylococcus aureus NEGATIVE  NEGATIVE Final   URINE CULTURE     Status: Normal   Collection Time   05/15/12  3:19 PM      Component Value Range Status Comment   Specimen Description URINE, CLEAN CATCH   Final    Special Requests NONE   Final    Culture  Setup Time 05/15/2012 15:52   Final    Colony Count 1,000 COLONIES/ML   Final    Culture INSIGNIFICANT GROWTH   Final    Report Status 05/16/2012 FINAL   Final   CULTURE, BLOOD (ROUTINE X 2)     Status: Normal (Preliminary result)   Collection Time   05/21/12  8:17 PM      Component Value Range Status Comment   Specimen Description BLOOD RIGHT HAND   Final    Special Requests BOTTLES DRAWN AEROBIC AND ANAEROBIC 6CC   Final    Culture NO GROWTH 1 DAY   Final    Report Status PENDING   Incomplete   CULTURE, BLOOD (ROUTINE X 2)     Status: Normal (Preliminary result)    Collection Time   05/21/12  8:18 PM      Component Value Range Status Comment   Specimen Description BLOOD RIGHT HAND   Final    Special Requests BOTTLES DRAWN AEROBIC AND ANAEROBIC 6CC   Final    Culture NO GROWTH 1 DAY   Final    Report Status PENDING   Incomplete      Studies: Dg Chest 1 View  05/21/2012  *RADIOLOGY REPORT*  Clinical Data: Fever.  CHEST - 1 VIEW  Comparison: 05/15/2012  Findings: 2115 hours. Hyperexpansion is consistent with emphysema. No focal airspace consolidation.  There is minimal atelectasis at the left base. The  cardiopericardial silhouette is within normal limits for size. Bones are diffusely demineralized.  IMPRESSION: Stable.  Emphysema without acute cardiopulmonary findings.   Original Report Authenticated By: ERIC A. MANSELL, M.D.    Ct Head Wo Contrast  05/21/2012  *RADIOLOGY REPORT*  Clinical Data: Follow-up trace left subdural hematoma.  CT HEAD WITHOUT CONTRAST  Technique:  Contiguous axial images were obtained from the base of the skull through the vertex without contrast.  Comparison: CT and MR dated 05/15/2012.  Findings: Mild left frontal scalp soft tissue swelling.  Stable enlarged ventricles and subarachnoid spaces.  Stable patchy white matter low density in both cerebral hemispheres.  No skull fracture, intracranial hemorrhage or paranasal sinus air-fluid levels seen.  Specifically, the trace subdural hematoma seen on the left on the recent MR is again not visible by CT.  IMPRESSION:  1.  No acute abnormality. 2.  The previously seen trace left subdural hematoma on the MR is again not visible by CT. 3.  Stable atrophy and chronic small vessel white matter ischemic changes.   Original Report Authenticated By: Darrol Angel, M.D.    Ct Hip Right Wo Contrast  05/21/2012  *RADIOLOGY REPORT*  Clinical Data: Right hip and leg pain.  Status post hardware fixation of a right hip fracture on 05/13/2012.  CT OF THE RIGHT HIP WITHOUT CONTRAST  Technique:   Multidetector CT imaging was performed according to the standard protocol. Multiplanar CT image reconstructions were also generated.  Comparison: Operative and preoperative right hip radiographs dated 05/13/2012.  Findings: Intramedullary rod and compression screw fixation of the previously demonstrated comminuted right intertrochanteric fracture.  Essentially anatomic position and alignment of the major fragments.  No new fracture or dislocation seen.  IMPRESSION: Satisfactory postoperative appearance of hardware fixation of a comminuted right intertrochanteric fracture.  No new fractures seen.   Original Report Authenticated By: Darrol Angel, M.D.     Scheduled Meds:   . donepezil  10 mg Oral Daily  . escitalopram  10 mg Oral Daily  . levothyroxine  125 mcg Oral QAC breakfast  . losartan  50 mg Oral Daily  . memantine  10 mg Oral BID  . multivitamin with minerals  1 tablet Oral Daily  . pantoprazole (PROTONIX) IV  40 mg Intravenous Q24H   Continuous Infusions:   . dextrose 5 % and 0.45% NaCl 75 mL/hr at 05/21/12 2220    Principal Problem:  *Fever Active Problems:  HYPERTENSION  PEPTIC ULCER DISEASE  Hypothyroidism  Anemia  Encephalopathy acute  Dementia  History of fracture of right hip  History of subdural hematoma (post traumatic)    Time spent:    [facility]  [facility] Pager [phone]. If 7PM-7AM, please contact night-coverage at [email or web address], password [facility] 05/22/2012, 1:55 PM  LOS: 1 day

## 2012-05-22 NOTE — Progress Notes (Signed)
Dr. Rito Ehrlich notified of Temp., Tyenol given, NS going at 50/hr., blood returned to lab, UA done, order received to restart blood when no fever.

## 2012-05-22 NOTE — Therapy (Signed)
Consult received for BSE, however RN states that she passed RN swallow screen without difficulty. Please reconsult/clarify is SLP evaluation is indicated.  Thank you, Havery Moros, CCC-SLP [phone]

## 2012-05-23 DIAGNOSIS — R509 Fever, unspecified: Secondary | ICD-10-CM | POA: Diagnosis not present

## 2012-05-23 DIAGNOSIS — M25559 Pain in unspecified hip: Secondary | ICD-10-CM | POA: Diagnosis not present

## 2012-05-23 DIAGNOSIS — D649 Anemia, unspecified: Secondary | ICD-10-CM | POA: Diagnosis not present

## 2012-05-23 DIAGNOSIS — Z8781 Personal history of (healed) traumatic fracture: Secondary | ICD-10-CM | POA: Diagnosis not present

## 2012-05-23 DIAGNOSIS — Z8679 Personal history of other diseases of the circulatory system: Secondary | ICD-10-CM

## 2012-05-23 LAB — CBC
HCT: 26.5 % — ABNORMAL LOW (ref 36.0–46.0)
MCV: 93.6 fL (ref 78.0–100.0)
Platelets: 355 10*3/uL (ref 150–400)
RBC: 2.83 MIL/uL — ABNORMAL LOW (ref 3.87–5.11)
WBC: 9.9 10*3/uL (ref 4.0–10.5)

## 2012-05-23 LAB — BASIC METABOLIC PANEL
BUN: 25 mg/dL — ABNORMAL HIGH (ref 6–23)
CO2: 25 mEq/L (ref 19–32)
Chloride: 99 mEq/L (ref 96–112)
Creatinine, Ser: 1.16 mg/dL — ABNORMAL HIGH (ref 0.50–1.10)

## 2012-05-23 LAB — TRANSFUSION REACTION: Post RXN DAT IgG: POSITIVE

## 2012-05-23 MED ORDER — PANTOPRAZOLE SODIUM 40 MG PO TBEC
40.0000 mg | DELAYED_RELEASE_TABLET | Freq: Every day | ORAL | Status: DC
  Administered 2012-05-23 – 2012-05-24 (×2): 40 mg via ORAL
  Filled 2012-05-23 (×2): qty 1

## 2012-05-23 MED ORDER — ACETAMINOPHEN 325 MG PO TABS
650.0000 mg | ORAL_TABLET | Freq: Once | ORAL | Status: AC
  Administered 2012-05-23: 650 mg via ORAL
  Filled 2012-05-23: qty 2

## 2012-05-23 MED ORDER — SODIUM CHLORIDE 0.9 % IJ SOLN
INTRAMUSCULAR | Status: AC
  Administered 2012-05-23: 23:00:00
  Filled 2012-05-23: qty 3

## 2012-05-23 NOTE — Progress Notes (Signed)
UR Chart Review Completed  

## 2012-05-23 NOTE — Progress Notes (Signed)
Called Dr. Orvan Falconer to notify him that lab says it is ok for pt to receive additional blood if he requests. The blood expires at midnight per lab. pts HgB today was 8.9 after receiving one unit yesterday. Waiting return call. Sheryn Bison

## 2012-05-23 NOTE — Consult Note (Signed)
  Mrs. Lamoreaux is well known to me.  She sustained an intertrochanteric fracture of the right hip on 05/13/12 and subsequently underwent intramedullary fixation later that day in [place].  I have reviewed the pre and post operative x-rays and have reviewed the medical records.  All look good and appropriate.  I have reviewed the current medical record and labs and x-rays.  She has now developed some post operative ecchymosis and anemia.  She has no new trauma.  She had CT scan of the right hip and proximal femur which was negative except for the recent surgery.  I have reviewed this study as well and agree that she has no new fracture.  I do not appreciate any significant hematoma present around the hip or femur area.  Clinically she has ecchymosis of the right lateral and posterior hip and thigh area with steristrips in place.  The wounds look good.  She has some slight swelling as would be expected after the surgical procedure but no large or significant hematoma.  She has no area of induration or redness.  She has normal neurovascular status distally with no significant edema or Homan's sign.    I do not feel her anemia is related to any hematoma of the right hip or thigh.  She may have lost some blood secondary to the surgery but I do not feel she is currently bleeding or loosing blood around the right hip wound or area.  I would suggest she continue physical therapy for her hip.

## 2012-05-23 NOTE — Progress Notes (Signed)
[facility] PROGRESS NOTE  [name] XBJ:[id number] DOB: 10/07/1919 DOA: 05/21/2012 PCP: [name], MD  Assessment/Plan: Principal Problem:  *Fever Active Problems:  HYPERTENSION  PEPTIC ULCER DISEASE  Hypothyroidism  Anemia  Encephalopathy acute  Dementia  History of fracture of right hip  History of subdural hematoma (post traumatic)  1. Anemia, possibly due to acute blood loss. This likely occurred due to blood loss from prior surgery. She likely has some underlying iron deficiency anemia as well. Patient was transfused PRBCs yesterday and her hemoglobin is improved today. No other sources of bleeding. The patient was seen by orthopedics who did not feel that patient is losing significant blood into her hip/leg. 2. Fever. Patient had recurrent fever last night. Etiology remains unclear. Urinalysis not convincing of infection, chest x-ray does not reveal any pneumonia, she's not having diarrhea, she does not have any evidence of cellulitis or DVTs. At this point we will continue to monitor the patient for recurrence of fever. Her blood cultures have remained negative. We'll continue to hold antibiotics until we have a source of fever.. 3. Encephalopathy. Possibly related to medications. At this time she is awake, participating in conversation, we'll continue to follow 4. Recent right hip fracture. Patient will need to continue to undergo physical therapy. 5. Recent subdural hematoma. Patient will need followup MRI to be done in 3 months followed by neurosurgical followup to 3 weeks thereafter. She will be provided with SCDs for DVT prophylaxis. After discharge she may start on aspirin. 6. Mild dehydration. Patient is receiving gentle hydration.  Code Status: DO NOT RESUSCITATE Family Communication: Discussed with patient and daughter Disposition Plan: Will need to go back to skilled nursing facility on discharge   Brief narrative: This lady was sent to the hospital  with low hemoglobin, confusion. She recently underwent hip fracture surgery at [facility] and was sent to the nursing center for therapy. It is noted that she had a declining hemoglobin and was becoming confused. She was evaluated in the emergency room and was found to be anemic. He was also noted to febrile. She was admitted to the hospital for evaluation.  Consultants:  Orthopedics, Dr. Hilda Lias  Procedures:  None  Antibiotics:  None  HPI/Subjective: Patient says she feels very weak. Denies any significant cough, no nausea or vomiting, no diarrhea, no dysuria.  Objective: Filed Vitals:   05/22/12 1630 05/22/12 1850 05/22/12 2045 05/23/12 0626  BP: 129/73 121/69 114/62 127/73  Pulse: 74 78 85 76  Temp: 98.8 F (37.1 C) 101.5 F (38.6 C) 100.3 F (37.9 C) 98.5 F (36.9 C)  TempSrc: Oral Oral Oral Oral  Resp: 16 16 18 18   Height:      Weight:      SpO2:   96% 95%    Intake/Output Summary (Last 24 hours) at 05/23/12 1558 Last data filed at 05/23/12 1200  Gross per 24 hour  Intake    530 ml  Output    950 ml  Net   -420 ml   Filed Weights   05/21/12 1838  Weight: 51.665 kg (113 lb 14.4 oz)    Exam:   General:  No acute distress  Cardiovascular: S1, S2, regular rhythm  Respiratory: Clear to auscultation bilaterally  Abdomen: Soft, nontender, nondistended, bowel sounds are active  Extremities: There is ecchymosis and tenderness around the right hip. Ecchymosis is also noted in the pubic/genital area.  No significant warmth over her hip is appreciated. Her right thigh is more swollen than  her left thigh.  Data Reviewed: Basic Metabolic Panel:  Lab 05/23/12 1610 05/22/12 0528 05/21/12 1225  NA 132* 132* 133*  K 4.2 4.1 4.6  CL 99 98 97  CO2 25 26 28   GLUCOSE 91 113* 95  BUN 25* 27* 33*  CREATININE 1.16* 1.17* 1.11*  CALCIUM 8.6 8.6 9.2  MG -- -- --  PHOS -- -- --   Liver Function Tests:  Lab 05/22/12 0528  AST 17  ALT 7  ALKPHOS 60  BILITOT 0.8    PROT 5.4*  ALBUMIN 2.4*   No results found for this basename: LIPASE:5,AMYLASE:5 in the last 168 hours No results found for this basename: AMMONIA:5 in the last 168 hours CBC:  Lab 05/23/12 0527 05/22/12 0528 05/21/12 1225  WBC 9.9 10.1 10.5  NEUTROABS -- -- 7.6  HGB 8.9* 7.2* 8.6*  HCT 26.5* 21.6* 26.1*  MCV 93.6 94.7 96.3  PLT 355 376 385   Cardiac Enzymes: No results found for this basename: CKTOTAL:5,CKMB:5,CKMBINDEX:5,TROPONINI:5 in the last 168 hours BNP (last 3 results) No results found for this basename: PROBNP:3 in the last 8760 hours CBG: No results found for this basename: GLUCAP:5 in the last 168 hours  Recent Results (from the past 240 hour(s))  SURGICAL PCR SCREEN     Status: Normal   Collection Time   05/13/12  4:43 PM      Component Value Range Status Comment   MRSA, PCR NEGATIVE  NEGATIVE Final    Staphylococcus aureus NEGATIVE  NEGATIVE Final   URINE CULTURE     Status: Normal   Collection Time   05/15/12  3:19 PM      Component Value Range Status Comment   Specimen Description URINE, CLEAN CATCH   Final    Special Requests NONE   Final    Culture  Setup Time 05/15/2012 15:52   Final    Colony Count 1,000 COLONIES/ML   Final    Culture INSIGNIFICANT GROWTH   Final    Report Status 05/16/2012 FINAL   Final   CULTURE, BLOOD (ROUTINE X 2)     Status: Normal (Preliminary result)   Collection Time   05/21/12  8:17 PM      Component Value Range Status Comment   Specimen Description BLOOD RIGHT HAND   Final    Special Requests BOTTLES DRAWN AEROBIC AND ANAEROBIC 6CC   Final    Culture NO GROWTH 2 DAYS   Final    Report Status PENDING   Incomplete   CULTURE, BLOOD (ROUTINE X 2)     Status: Normal (Preliminary result)   Collection Time   05/21/12  8:18 PM      Component Value Range Status Comment   Specimen Description BLOOD RIGHT HAND   Final    Special Requests BOTTLES DRAWN AEROBIC AND ANAEROBIC 6CC   Final    Culture NO GROWTH 2 DAYS   Final    Report  Status PENDING   Incomplete      Studies: Dg Chest 1 View  05/21/2012  *RADIOLOGY REPORT*  Clinical Data: Fever.  CHEST - 1 VIEW  Comparison: 05/15/2012  Findings: 2115 hours. Hyperexpansion is consistent with emphysema. No focal airspace consolidation.  There is minimal atelectasis at the left base. The cardiopericardial silhouette is within normal limits for size. Bones are diffusely demineralized.  IMPRESSION: Stable.  Emphysema without acute cardiopulmonary findings.   Original Report Authenticated By: ERIC A. MANSELL, M.D.    Ct Head Wo Contrast  05/21/2012  *  RADIOLOGY REPORT*  Clinical Data: Follow-up trace left subdural hematoma.  CT HEAD WITHOUT CONTRAST  Technique:  Contiguous axial images were obtained from the base of the skull through the vertex without contrast.  Comparison: CT and MR dated 05/15/2012.  Findings: Mild left frontal scalp soft tissue swelling.  Stable enlarged ventricles and subarachnoid spaces.  Stable patchy white matter low density in both cerebral hemispheres.  No skull fracture, intracranial hemorrhage or paranasal sinus air-fluid levels seen.  Specifically, the trace subdural hematoma seen on the left on the recent MR is again not visible by CT.  IMPRESSION:  1.  No acute abnormality. 2.  The previously seen trace left subdural hematoma on the MR is again not visible by CT. 3.  Stable atrophy and chronic small vessel white matter ischemic changes.   Original Report Authenticated By: Darrol Angel, M.D.    Ct Hip Right Wo Contrast  05/21/2012  *RADIOLOGY REPORT*  Clinical Data: Right hip and leg pain.  Status post hardware fixation of a right hip fracture on 05/13/2012.  CT OF THE RIGHT HIP WITHOUT CONTRAST  Technique:  Multidetector CT imaging was performed according to the standard protocol. Multiplanar CT image reconstructions were also generated.  Comparison: Operative and preoperative right hip radiographs dated 05/13/2012.  Findings: Intramedullary rod and  compression screw fixation of the previously demonstrated comminuted right intertrochanteric fracture.  Essentially anatomic position and alignment of the major fragments.  No new fracture or dislocation seen.  IMPRESSION: Satisfactory postoperative appearance of hardware fixation of a comminuted right intertrochanteric fracture.  No new fractures seen.   Original Report Authenticated By: Darrol Angel, M.D.     Scheduled Meds:    . donepezil  10 mg Oral Daily  . escitalopram  10 mg Oral Daily  . levothyroxine  125 mcg Oral QAC breakfast  . losartan  50 mg Oral Daily  . memantine  10 mg Oral BID  . multivitamin with minerals  1 tablet Oral Daily  . pantoprazole  40 mg Oral Q1200  . DISCONTD: pantoprazole (PROTONIX) IV  40 mg Intravenous Q24H   Continuous Infusions:   Principal Problem:  *Fever Active Problems:  HYPERTENSION  PEPTIC ULCER DISEASE  Hypothyroidism  Anemia  Encephalopathy acute  Dementia  History of fracture of right hip  History of subdural hematoma (post traumatic)    Time spent:    [facility]  [facility] Pager [phone]. If 7PM-7AM, please contact night-coverage at [email or web address], password [facility] 05/23/2012, 3:58 PM  LOS: 2 days

## 2012-05-23 NOTE — Evaluation (Signed)
Occupational Therapy Evaluation Patient Details Name: [name] MRN: [id number] DOB: [birth year] Today's Date: 05/23/2012 Time: 1191-4782 OT Time Calculation (min): 42 min OT eval 15' Theract 27'  OT Assessment / Plan / Recommendation Clinical Impression  Patient is a 78 y/o female s/p fever (recent OTIF R hip fx) presenting to acute OT with deficits below. PTA, patient was recently at [facility] (admit last wednesday) for R hip fx. Before hip fx, patient was living at [facility] for 3 years. At the [facility], patient was independent with BADL. patient reports needing supervision for bathing in shower. Patient will benefit from OT services to increase ADL performance, functional transfers and Bil UE strength and endurance. Patient will benefit from returning to [facility] to continue rehab at D/C.    OT Assessment  Patient needs continued OT Services    Follow Up Recommendations  Skilled nursing facility       Equipment Recommendations  Other (comment) (defer to next venue)       Frequency  Min 2X/week    Precautions / Restrictions Precautions Precautions: Fall Restrictions Weight Bearing Restrictions: No   Pertinent Vitals/Pain Pt reports 8/10 pain in RLE. Nursing aware and pain meds given.    ADL  Lower Body Dressing: Simulated;+1 Total assistance Where Assessed - Lower Body Dressing: Supported sit to stand Transfers/Ambulation Related to ADLs: Patient unable to perform transfer, but did perform 2 sit to stands at [facility] Assist with RW.    OT Diagnosis: Generalized weakness  OT Problem List: Decreased strength;Impaired balance (sitting and/or standing);Decreased range of motion;Decreased activity tolerance;Decreased coordination;Decreased knowledge of use of DME or AE;Decreased safety awareness;Decreased cognition;Decreased knowledge of precautions;Pain OT Treatment Interventions: Self-care/ADL training;DME and/or AE instruction;Manual therapy;Therapeutic exercise;Energy  conservation;Therapeutic activities;Patient/family education;Balance training;Cognitive remediation/compensation   OT Goals Acute Rehab OT Goals OT Goal Formulation: With patient Time For Goal Achievement: 06/06/12 Potential to Achieve Goals: Fair ADL Goals Pt Will Perform Grooming: with set-up;Sitting, edge of bed ADL Goal: Grooming - Progress: Goal set today Pt Will Perform Upper Body Bathing: with set-up;Sitting, edge of bed;Supported ADL Goal: Upper Body Bathing - Progress: Goal set today Pt Will Perform Lower Body Bathing: with mod assist;Sit to stand from bed;Supported ADL Goal: Lower Body Bathing - Progress: Goal set today Pt Will Perform Upper Body Dressing: with set-up;Sitting, bed;Supported ADL Goal: Upper Body Dressing - Progress: Goal set today Pt Will Perform Lower Body Dressing: with max assist;Supported;Sit to stand from bed ADL Goal: Lower Body Dressing - Progress: Goal set today Pt Will Transfer to Toilet: with mod assist;3-in-1;Stand pivot transfer;with DME ADL Goal: Toilet Transfer - Progress: Goal set today Arm Goals Pt Will Perform AROM: with supervision, verbal cues required/provided;to maintain range of motion;Bilateral upper extremities;1 set;10 reps Arm Goal: AROM - Progress: Goal set today  Visit Information  Last OT Received On: 05/23/12 Assistance Needed: +1 (2nd person may be helpful)    Subjective Data  Subjective: "I am so tired." Patient Stated Goal: To go home.   Prior Functioning     Home Living Lives With: Alone [facility]bie [facility] house) Available Help at Discharge: Personal care attendant Type of Home: Apartment Home Access: Level entry Entrance Stairs-Rails: None Bathroom Shower/Tub: Engineer, manufacturing systems: Standard Home Adaptive Equipment: Straight cane Additional Comments: Pt has been at SNF following OTIF of R hip...she still has her apt at [facility]. Information placed in here is based on patient's abilities at [facility]  since that where she would like to go and son reported that she  has only be at [facility] since last Wed. Prior Function Level of Independence: Needs assistance Needs Assistance: Bathing;Dressing;Grooming;Toileting;Meal Prep;Light Housekeeping Bath: Supervision/set-up Dressing: Supervision/set-up Grooming: Supervision/set-up Toileting: Minimal Meal Prep: Total Light Housekeeping: Total Able to Take Stairs?: No Driving: No Vocation: Retired Musician: No difficulties Dominant Hand: Right            Cognition  Overall Cognitive Status: History of cognitive impairments - at baseline Arousal/Alertness: Awake/alert Orientation Level: Person;Situation Behavior During Session: [facility] for tasks performed    Extremity/Trunk Assessment Right Upper Extremity Assessment RUE ROM/Strength/Tone: Deficits;Unable to fully assess RUE ROM/Strength/Tone Deficits: Pt states that both her arms are injuried. Need to assess further next tx session. RUE Sensation: WFL - Light Touch;WFL - Proprioception Left Upper Extremity Assessment LUE ROM/Strength/Tone: Deficits;Unable to fully assess;Due to pain LUE ROM/Strength/Tone Deficits: Pt states that both her arms are injuried. Need to assess further next tx session. LUE Sensation: WFL - Light Touch;WFL - Proprioception     Mobility Bed Mobility Bed Mobility: Scooting to HOB;Sit to Supine Supine to Sit: 2: Max assist;HOB elevated Supine to Sit: Patient Percentage: 30% Sitting - Scoot to Edge of Bed: 1: +1 Total assist Sitting - Scoot to Edge of Bed: Patient Percentage: 10% Sit to Supine: 1: +2 Total assist;HOB flat Sit to Supine: Patient Percentage: 0% Scooting to HOB: 1: +2 Total assist Scooting to [facility]: Patient Percentage: 10% Details for Bed Mobility Assistance: Very slow moving secondary to pain level.  Transfers Transfers: Sit to Stand;Stand to Sit Sit to Stand: 2: Max assist;From bed;With upper extremity assist Stand to Sit: 2:  Max assist;With upper extremity assist;To bed Details for Transfer Assistance: Patient unable to perform stand pivot transfer to recliner. patient did perform 2 sit to stands at EOB with RW. 1st trail: 30 seconds; 2nd trail: 1 min. Max vc's for technique, encouragement, tactile cues for hand placement.           Balance Balance Balance Assessed: Yes Static Standing Balance Static Standing - Balance Support: Bilateral upper extremity supported (posterior lean when standing.) Static Standing - Level of Assistance: 2: Max assist Static Standing - Comment/# of Minutes: 1st trial: 30 sec. 2nd trial: 1 min.    End of Session OT - End of Session Equipment Utilized During Treatment: Gait belt;Other (comment) (rolling walker) Activity Tolerance: Patient tolerated treatment well;Patient limited by fatigue;Patient limited by pain Patient left: in bed;with call bell/phone within reach;with family/visitor present Nurse Communication: Patient requests pain meds    Limmie Patricia, OTR/L 05/23/2012, 3:05 PM

## 2012-05-23 NOTE — Progress Notes (Signed)
Physical Therapy Treatment Patient Details Name: [name] MRN: [id number] DOB: September 23, 1919 Today's Date: 05/23/2012 Time: 4098-1191 PT Time Calculation (min): 57 min therex 17 mins there activity   PT Assessment / Plan / Recommendation Comments on Treatment Session  Patient very fearful to move due to RLE pain and fear of hospital staff being "too rough". Patient frequently yells out in pain with any part of RLE being touched and moves LLE extremely slow as well.Patient requires and great deal of patience and "TLC" to be cooperative. By the end of session patient was able to relax enough to allow therapist to do additional PROM exercises of RLE. Will attempt to do therapy tomorrow closer to time of pain medication in hopes of better outcome and  atleast sitting at EOB.    Follow Up Recommendations       Barriers to Discharge        Equipment Recommendations       Recommendations for Other Services    Frequency     Plan      Precautions / Restrictions Restrictions Weight Bearing Restrictions: No   Pertinent Vitals/Pain     Mobility  Bed Mobility Bed Mobility: Not assessed (pt attempted L lateral supine scoot;too painful,refused) Transfers Transfers: Not assessed    Exercises General Exercises - Lower Extremity Ankle Circles/Pumps: Both;10 reps Quad Sets: Both;10 reps Gluteal Sets: 10 reps Heel Slides: PROM;AAROM;Both (R AAROMx4-PROM x10; L AAROM x10) Hip ABduction/ADduction: PROM;AAROM;Both (R AAROMx4-PROM x10; L AAROM x10) Other Exercises Other Exercises: LLE bridges x4   PT Diagnosis:    PT Problem List:   PT Treatment Interventions:     PT Goals    Visit Information  Last PT Received On: 05/23/12    Subjective Data  Subjective: I hate being dependent on everyone   Cognition       Balance     End of Session PT - End of Session Activity Tolerance: Patient limited by pain;Patient limited by fatigue Patient left: in bed;with call bell/phone  within reach;with bed alarm set;with family/visitor present;with nursing in room   GP     Hiromi Knodel ATKINSO 05/23/2012, 11:40 AM

## 2012-05-24 ENCOUNTER — Inpatient Hospital Stay
Admission: RE | Admit: 2012-05-24 | Discharge: 2012-07-26 | Disposition: A | Discharge status: Nursing Home (Includes ICF) | Payer: Medicare Other | Source: Ambulatory Visit | Attending: Internal Medicine | Admitting: Internal Medicine

## 2012-05-24 ENCOUNTER — Inpatient Hospital Stay (HOSPITAL_COMMUNITY): Payer: Medicare Other

## 2012-05-24 DIAGNOSIS — I10 Essential (primary) hypertension: Secondary | ICD-10-CM | POA: Diagnosis not present

## 2012-05-24 DIAGNOSIS — Z8679 Personal history of other diseases of the circulatory system: Secondary | ICD-10-CM | POA: Diagnosis not present

## 2012-05-24 DIAGNOSIS — K319 Disease of stomach and duodenum, unspecified: Secondary | ICD-10-CM | POA: Diagnosis not present

## 2012-05-24 DIAGNOSIS — D649 Anemia, unspecified: Secondary | ICD-10-CM | POA: Diagnosis not present

## 2012-05-24 DIAGNOSIS — K922 Gastrointestinal hemorrhage, unspecified: Secondary | ICD-10-CM | POA: Diagnosis not present

## 2012-05-24 DIAGNOSIS — Z87828 Personal history of other (healed) physical injury and trauma: Secondary | ICD-10-CM | POA: Diagnosis not present

## 2012-05-24 DIAGNOSIS — Z9889 Other specified postprocedural states: Secondary | ICD-10-CM | POA: Diagnosis not present

## 2012-05-24 DIAGNOSIS — S72143A Displaced intertrochanteric fracture of unspecified femur, initial encounter for closed fracture: Secondary | ICD-10-CM | POA: Diagnosis not present

## 2012-05-24 DIAGNOSIS — E039 Hypothyroidism, unspecified: Secondary | ICD-10-CM | POA: Diagnosis not present

## 2012-05-24 DIAGNOSIS — Z8781 Personal history of (healed) traumatic fracture: Secondary | ICD-10-CM | POA: Diagnosis not present

## 2012-05-24 DIAGNOSIS — R0902 Hypoxemia: Secondary | ICD-10-CM | POA: Diagnosis not present

## 2012-05-24 DIAGNOSIS — D62 Acute posthemorrhagic anemia: Secondary | ICD-10-CM | POA: Diagnosis not present

## 2012-05-24 DIAGNOSIS — R509 Fever, unspecified: Secondary | ICD-10-CM | POA: Diagnosis not present

## 2012-05-24 DIAGNOSIS — G9340 Encephalopathy, unspecified: Secondary | ICD-10-CM | POA: Diagnosis not present

## 2012-05-24 DIAGNOSIS — S72009D Fracture of unspecified part of neck of unspecified femur, subsequent encounter for closed fracture with routine healing: Secondary | ICD-10-CM | POA: Diagnosis not present

## 2012-05-24 DIAGNOSIS — F411 Generalized anxiety disorder: Secondary | ICD-10-CM | POA: Diagnosis not present

## 2012-05-24 DIAGNOSIS — Z5189 Encounter for other specified aftercare: Secondary | ICD-10-CM | POA: Diagnosis not present

## 2012-05-24 DIAGNOSIS — F0390 Unspecified dementia without behavioral disturbance: Secondary | ICD-10-CM | POA: Diagnosis not present

## 2012-05-24 LAB — CBC
Hemoglobin: 10.3 g/dL — ABNORMAL LOW (ref 12.0–15.0)
RBC: 3.34 MIL/uL — ABNORMAL LOW (ref 3.87–5.11)

## 2012-05-24 LAB — BASIC METABOLIC PANEL
CO2: 27 mEq/L (ref 19–32)
Chloride: 99 mEq/L (ref 96–112)
Glucose, Bld: 102 mg/dL — ABNORMAL HIGH (ref 70–99)
Potassium: 4.1 mEq/L (ref 3.5–5.1)
Sodium: 131 mEq/L — ABNORMAL LOW (ref 135–145)

## 2012-05-24 MED ORDER — OXYCODONE HCL 5 MG PO TABS: 5.0000 mg | ORAL_TABLET | ORAL | Status: DC | PRN

## 2012-05-24 MED ORDER — PANTOPRAZOLE SODIUM 40 MG PO TBEC: 40.0000 mg | DELAYED_RELEASE_TABLET | Freq: Every day | ORAL | Status: DC

## 2012-05-24 MED ORDER — FERROUS SULFATE 325 (65 FE) MG PO TABS: 325.0000 mg | ORAL_TABLET | Freq: Three times a day (TID) | ORAL | Status: DC

## 2012-05-24 NOTE — Discharge Summary (Signed)
Physician Discharge Summary  KARINNE SCHMADER ZOX:[id number] DOB: [birth year] DOA: 05/21/2012  PCP: [name], MD  Admit date: 05/21/2012 Discharge date: 05/24/2012  Recommendations for Outpatient Follow-up:  1. Follow up with MD at [facility] 2. Check CBC in 3-4 days to ensure stability of hemoglobin 3. Follow up with Dr. Rennis Chris for orthopedic care as previously scheduled  Discharge Diagnoses:  Principal Problem:  *Fever, unclear etiology, resolved Active Problems:  HYPERTENSION  PEPTIC ULCER DISEASE  Hypothyroidism  Anemia, iron deficiency and related to recent acute blood loss  Encephalopathy acute  Dementia  History of fracture of right hip  History of subdural hematoma (post traumatic)   Discharge Condition: Improved  Diet recommendation: low salt  Filed Weights   05/21/12 1838  Weight: 51.665 kg (113 lb 14.4 oz)    History of present illness:  This is a 78 year old, Caucasian female, who has a past medical history of dementia, breast cancer, hypothyroidism, hypertension, peptic ulcer disease, who underwent open reduction, internal fixation for right hip fracture earlier this month. This was done at [facility]. Patient was sent over to a skilled nursing facility for rehabilitation. During that hospitalization she was found to be anemic and required blood transfusion. She was also found to have a small subdural hematoma. Because of confusion she also underwent other testing, which revealed low probability for PE on the VQ scan. It was also felt that her confusion was secondary to narcotics and she was changed over to tramadol. She has been doing well in the skilled nursing facility. However, her appetite has been very poor. She has had visual hallucinations in the last few days. She was found to be talking to relative's who were not in the room. She was also diagnosed with a presumed UTI at [facility] and was discharged on ciprofloxacin. Urine cultures have not shown any  growth. She was sent over to the ED because of a hemoglobin of 7.9. No overt bleeding was noted anywhere. Once in the ED, hemoglobin is found to be greater than 8. She is however, febrile. There is no history of cough. Patient unable to provide much history because of confusion. Her daughter-in-law is in the room and she was able to give me few answers. There is no history of any black stools or blood in the stools. History is somewhat limited.  Hospital Course:  This lady was admitted to the hospital with anemia and fever. She recently underwent hip surgery for a fracture. She was sent to the [facility] for rehab.  During her stay at [name], she did require transfusion of 2 unit of prbc.  This was felt to be due to an acute blood loss anemia.  She was also noted to be iron deficient. She was sent to the [facility] where repeat blood work indicated worsening anemia.  She was sent to the hospital for evaluation where she ended up receiving 2 unit of prbc.  No clear source of bleeding could be identified.  She did not have any evidence of GI bleeding.  She had no bruising on her abdomen or flanks to indicate retroperitoneal bleeding. She did have significant bruising around the hip and underwent CT of the hip.  There was no mention of hematoma.  She was seen by Dr. Hilda Lias who felt the bruising and edema in hip/thigh were to be expected in her post op course and did not feel that she was bleeding into her hip/thigh.  Her hemoglobin has improved.  She will need repeat hemoglobin in 3-4 days to ensure stability.  She has been started on iron supplementation. She does have a history of a gastric ulcer, although she has not had any melena/hematochezia.  I have started her on protonix.  Patient also did have fevers.  Chest xray did not reveal any pneumonias, and urinalysis was also unremarkable.  Blood cultures showed no growth. Orthopedics did not feel that her hip was the source of infection.  Venous  dopplers rule out DVT in her RLE. Her fevers spontaneously resolved.  It is possible that this could be due to some degree of atelectasis. She does not have any source of infection at this time.  Her wbc count is normal and she is now afebrile.  This can be further monitored in the outpatient setting.  The remainder of her medical issues remained stable.  Procedures:  none  Consultations:  Orthopedics, Dr. Hilda Lias  Discharge Exam: Filed Vitals:   05/24/12 0234 05/24/12 7829 05/24/12 0805 05/24/12 1020  BP: 116/72 124/44  121/51  Pulse: 64 60  62  Temp: 98 F (36.7 C) 98 F (36.7 C)  98.4 F (36.9 C)  TempSrc: Oral Oral  Oral  Resp: 16 18    Height:      Weight:      SpO2:  98% 98% 98%    General: NAD Cardiovascular: s1, s2, rrr Respiratory: cta b  Discharge Instructions  Discharge Orders    Future Orders Please Complete By Expires   Diet - low sodium heart healthy      Increase activity slowly          Medication List     As of 05/24/2012 12:56 PM    STOP taking these medications         ciprofloxacin 500 MG tablet   Commonly known as: CIPRO      TAKE these medications         acetaminophen 500 MG tablet   Commonly known as: TYLENOL   Take 1,000 mg by mouth at bedtime.      acetaminophen 650 MG CR tablet   Commonly known as: TYLENOL   Take 650 mg by mouth every 4 (four) hours as needed. Pain. Not to exceed 3,000 mg in 24 hours.      ALPRAZolam 0.25 MG tablet   Commonly known as: XANAX   Take 1 tablet (0.25 mg total) by mouth 2 (two) times daily.      Benefiber Powd   Take 10 mLs by mouth 2 (two) times daily.      Biotin 300 MCG Tabs   Take 600 mcg by mouth daily.      calcium-vitamin D 500-200 MG-UNIT per tablet   Commonly known as: OSCAL WITH D   Take 1 tablet by mouth daily.      Daily Vite Tabs   Take 1 tablet by mouth daily.      donepezil 10 MG tablet   Commonly known as: ARICEPT   Take 10 mg by mouth daily.      escitalopram 10 MG  tablet   Commonly known as: LEXAPRO   Take 10 mg by mouth daily.      feeding supplement Liqd   Take 237 mLs by mouth as needed (for nutritional supplementation).      ferrous sulfate 325 (65 FE) MG tablet   Take 1 tablet (325 mg total) by mouth 3 (three) times daily with meals.      levothyroxine 125  MCG tablet   Commonly known as: SYNTHROID, LEVOTHROID   Take 125 mcg by mouth daily.      losartan 50 MG tablet   Commonly known as: COZAAR   Take 50 mg by mouth daily.      Lutein 6 MG Caps   Take 1 capsule by mouth daily.      magnesium hydroxide 400 MG/5ML suspension   Commonly known as: MILK OF MAGNESIA   Take 30 mLs by mouth daily as needed. Constipation.      memantine 10 MG tablet   Commonly known as: NAMENDA   Take 10 mg by mouth 2 (two) times daily.      metaxalone 800 MG tablet   Commonly known as: SKELAXIN   Take 800 mg by mouth 3 (three) times daily as needed. Muscle spasms.      omeprazole 20 MG capsule   Commonly known as: PRILOSEC   Take 1 capsule (20 mg total) by mouth daily.      oxyCODONE 5 MG immediate release tablet   Commonly known as: Oxy IR/ROXICODONE   Take 1 tablet (5 mg total) by mouth every 4 (four) hours as needed.      pantoprazole 40 MG tablet   Commonly known as: PROTONIX   Take 1 tablet (40 mg total) by mouth daily at 12 noon.      senna 8.6 MG Tabs   Commonly known as: SENOKOT   Take 1 tablet (8.6 mg total) by mouth 2 (two) times daily.      traMADol 50 MG tablet   Commonly known as: ULTRAM   Take 50 mg by mouth every 6 (six) hours as needed. Pain.          The results of significant diagnostics from this hospitalization (including imaging, microbiology, ancillary and laboratory) are listed below for reference.    Significant Diagnostic Studies: Dg Chest 1 View  05/21/2012  *RADIOLOGY REPORT*  Clinical Data: Fever.  CHEST - 1 VIEW  Comparison: 05/15/2012  Findings: 2115 hours. Hyperexpansion is consistent with emphysema. No  focal airspace consolidation.  There is minimal atelectasis at the left base. The cardiopericardial silhouette is within normal limits for size. Bones are diffusely demineralized.  IMPRESSION: Stable.  Emphysema without acute cardiopulmonary findings.   Original Report Authenticated By: ERIC A. MANSELL, M.D.    Dg Chest 1 View  05/13/2012  *RADIOLOGY REPORT*  Clinical Data: Fall, right hip pain  CHEST - 1 VIEW  Comparison: 09/30/2011  Findings: Cardiomediastinal silhouette is stable.  Hyperinflation again noted.  No acute infiltrate or pulmonary edema.  Again noted extensive degenerative changes left shoulder. Atherosclerotic calcifications of the thoracic aorta.  IMPRESSION: No active disease.  Hyperinflation again noted.  Again noted extensive degenerative changes left shoulder.   Original Report Authenticated By: Natasha Mead, M.D.    Dg Elbow Complete Right  05/13/2012  *RADIOLOGY REPORT*  Clinical Data: Fall, hematoma  RIGHT ELBOW - COMPLETE 3+ VIEW  Comparison: 03/06/2010  Findings: Three views of the right elbow submitted.  No acute fracture or subluxation.  No posterior fat pad sign.  Soft tissue swelling noted dorsal olecranon region.  IMPRESSION: No acute fracture or subluxation.  No posterior fat pad sign.  Soft tissue swelling dorsal olecranon region.   Original Report Authenticated By: Natasha Mead, M.D.    Dg Hip Complete Right  05/13/2012  *RADIOLOGY REPORT*  Clinical Data: Fall, right hip pain  RIGHT HIP - COMPLETE 2+ VIEW  Comparison: 06/04/2009  Findings: Three  views of the right hip submitted.  There is a comminuted displaced intertrochanteric and subtrochanteric fracture of the proximal right femur.  The right femoral head is still located in the right acetabulum. The lesser femoral trochanter fragment is displaced medially.  IMPRESSION: Comminuted displaced intertrochanteric and subtrochanteric fracture of proximal right femur.   Original Report Authenticated By: Natasha Mead, M.D.    Dg Hip  Operative Right  05/13/2012  *RADIOLOGY REPORT*  Clinical Data: 78 year old female undergoing ORIF right hip.  DG OPERATIVE RIGHT HIP  Comparison: Preoperative films 0916 hours the same day.  Fluoroscopy time of 0.8 minutes was utilized.  Findings: Four intraoperative fluoroscopic views of the proximal right femur.  Intramedullary rod and interlocking proximal dynamic hip screw traverse the comminuted intertrochanteric fracture. Distal interlocking cortical screw.  Hardware appears intact. Improved alignment about the proximal femur fracture.  IMPRESSION: ORIF right proximal femur with no adverse features.   Original Report Authenticated By: Harley Hallmark, M.D.    Dg Femur Right  05/13/2012  *RADIOLOGY REPORT*  Clinical Data: History of fall complaining of right-sided hip pain.  RIGHT FEMUR - 2 VIEW  Comparison: No priors.  Findings: Multiple views of the right femur demonstrate a comminuted intertrochanteric right femoral neck fracture (see dedicated report from the right hip for full details).  Distal femur is intact.  IMPRESSION: 1.  Comminuted intertrochanteric right hip fracture.   Original Report Authenticated By: Florencia Reasons, M.D.    Ct Head Wo Contrast  05/21/2012  *RADIOLOGY REPORT*  Clinical Data: Follow-up trace left subdural hematoma.  CT HEAD WITHOUT CONTRAST  Technique:  Contiguous axial images were obtained from the base of the skull through the vertex without contrast.  Comparison: CT and MR dated 05/15/2012.  Findings: Mild left frontal scalp soft tissue swelling.  Stable enlarged ventricles and subarachnoid spaces.  Stable patchy white matter low density in both cerebral hemispheres.  No skull fracture, intracranial hemorrhage or paranasal sinus air-fluid levels seen.  Specifically, the trace subdural hematoma seen on the left on the recent MR is again not visible by CT.  IMPRESSION:  1.  No acute abnormality. 2.  The previously seen trace left subdural hematoma on the MR is again  not visible by CT. 3.  Stable atrophy and chronic small vessel white matter ischemic changes.   Original Report Authenticated By: Darrol Angel, M.D.    Ct Head Wo Contrast  05/15/2012  *RADIOLOGY REPORT*  Clinical Data: 78 year old female recent fall.  New left hemipareses and altered mental status.  CT HEAD WITHOUT CONTRAST  Technique:  Contiguous axial images were obtained from the base of the skull through the vertex without contrast.  Comparison: Head CT 05/13/2012 and earlier.  Findings: Visualized paranasal sinuses and mastoids are clear.  No acute orbit or scalp soft tissue finding.  Calvarium intact.  Stable gray-white matter differentiation throughout the brain.  No evidence of cortically based acute infarction identified.  No midline shift, mass effect, or evidence of mass lesion.  No ventriculomegaly. No acute intracranial hemorrhage identified.  No suspicious intracranial vascular hyperdensity.  Patchy cerebral white matter hypodensity not significantly changed.  IMPRESSION: Stable noncontrast CT appearance the brain with no acute cortically based infarct or intracranial hemorrhage identified.   Original Report Authenticated By: Harley Hallmark, M.D.    Ct Head Wo Contrast  05/13/2012  *RADIOLOGY REPORT*  Clinical Data:  History of trauma from a fall.  CT HEAD WITHOUT CONTRAST CT CERVICAL SPINE WITHOUT CONTRAST  Technique:  Multidetector CT imaging of the head and cervical spine was performed following the standard protocol without intravenous contrast.  Multiplanar CT image reconstructions of the cervical spine were also generated.  Comparison:  Head CT 10/17/2011.  C-spine CT 03/06/2010.  CT HEAD  Findings: Moderate cerebral and cerebellar atrophy is age appropriate.  There are patchy areas of decreased attenuation throughout the deep and periventricular white matter of the cerebral hemispheres bilaterally, compatible with chronic microvascular ischemic changes. No acute displaced skull  fractures are identified.  No acute intracranial abnormality.  Specifically, no evidence of acute post-traumatic intracranial hemorrhage, no definite regions of acute/subacute cerebral ischemia, no focal mass, mass effect, hydrocephalus or abnormal intra or extra-axial fluid collections.  The visualized paranasal sinuses and mastoids are well pneumatized.  IMPRESSION: 1.  No acute displaced skull fractures or acute intracranial abnormalities. 2.  The appearance of brain is similar to prior examinations with moderate cerebral and cerebellar atrophy and chronic microvascular ischemic changes in the cerebral white matter.  CT CERVICAL SPINE  Findings: No acute displaced fractures of the cervical spine are noted.  There is severe multilevel degenerative disc disease, most pronounced at C3-C4, C4-C5, C5-C6 and C6-C7.  There is also severe multilevel facet arthropathy, most pronounced on the right at the C4-C5 the level where the facets are fused.  3 mm of anterolisthesis of C6 on C7 is noted.  Prevertebral soft tissues are normal.  Visualized portions of the upper thorax are remarkable for some pleural parenchymal thickening in the lung apices bilaterally, compatible with scarring.  IMPRESSION: 1.  No evidence of significant acute traumatic injury to the cervical spine. 2.  Severe multilevel degenerative disc disease and cervical spondylosis, as detailed above, including 3 mm of anterolisthesis of C6 on C7, which is unchanged compared to prior study 03/06/2010.   Original Report Authenticated By: Florencia Reasons, M.D.    Ct Cervical Spine Wo Contrast  05/13/2012  *RADIOLOGY REPORT*  Clinical Data:  History of trauma from a fall.  CT HEAD WITHOUT CONTRAST CT CERVICAL SPINE WITHOUT CONTRAST  Technique:  Multidetector CT imaging of the head and cervical spine was performed following the standard protocol without intravenous contrast.  Multiplanar CT image reconstructions of the cervical spine were also generated.   Comparison:  Head CT 10/17/2011.  C-spine CT 03/06/2010.  CT HEAD  Findings: Moderate cerebral and cerebellar atrophy is age appropriate.  There are patchy areas of decreased attenuation throughout the deep and periventricular white matter of the cerebral hemispheres bilaterally, compatible with chronic microvascular ischemic changes. No acute displaced skull fractures are identified.  No acute intracranial abnormality.  Specifically, no evidence of acute post-traumatic intracranial hemorrhage, no definite regions of acute/subacute cerebral ischemia, no focal mass, mass effect, hydrocephalus or abnormal intra or extra-axial fluid collections.  The visualized paranasal sinuses and mastoids are well pneumatized.  IMPRESSION: 1.  No acute displaced skull fractures or acute intracranial abnormalities. 2.  The appearance of brain is similar to prior examinations with moderate cerebral and cerebellar atrophy and chronic microvascular ischemic changes in the cerebral white matter.  CT CERVICAL SPINE  Findings: No acute displaced fractures of the cervical spine are noted.  There is severe multilevel degenerative disc disease, most pronounced at C3-C4, C4-C5, C5-C6 and C6-C7.  There is also severe multilevel facet arthropathy, most pronounced on the right at the C4-C5 the level where the facets are fused.  3 mm of anterolisthesis of C6 on C7 is noted.  Prevertebral soft tissues are normal.  Visualized portions of the upper thorax are remarkable for some pleural parenchymal thickening in the lung apices bilaterally, compatible with scarring.  IMPRESSION: 1.  No evidence of significant acute traumatic injury to the cervical spine. 2.  Severe multilevel degenerative disc disease and cervical spondylosis, as detailed above, including 3 mm of anterolisthesis of C6 on C7, which is unchanged compared to prior study 03/06/2010.   Original Report Authenticated By: Florencia Reasons, M.D.    Mr Brain Wo Contrast  05/15/2012   *RADIOLOGY REPORT*  Clinical Data: 78 year old female with new left hemipareses.  MRI HEAD WITHOUT CONTRAST  Technique:  Multiplanar, multiecho pulse sequences of the brain and surrounding structures were obtained according to standard protocol without intravenous contrast.  Comparison: Head CT without contrast 1821 hours the same day and earlier.  Findings: No restricted diffusion in the brain parenchyma to suggest acute infarct.  However, there is diffusion artifact associated with a trace left side subdural hematoma which is visible along the posterior left temporal lobe and layering in the left occiput (series 7 image 9).  Thickness is up to 3 mm.  No significant mass effect.  No intraventricular hemorrhage.  No other intracranial hemorrhage or extra-axial collection identified.  Major intracranial vascular flow voids are preserved.  Cerebral volume loss.  No midline shift or other intracranial mass lesion. Negative pituitary, cervicomedullary junction and cervical spine for age.  Mild for age cerebral white matter T2 and FLAIR hyperintensity.  Deep gray matter nuclei, brainstem and cerebellum are within normal limits for age.  Postoperative changes to the globes.  Minor paranasal sinus mucosal thickening.  Mastoids are clear.  Questionable loss of the normal T2 signal associated with the left IAC and cochlea.  Visualized bone marrow signal is within normal limits.  Negative scalp soft tissues.  IMPRESSION: 1.  No acute infarct identified, but the study is positive for a trace left side subdural hematoma.  No significant mass effect. 2.  No other acute intracranial abnormality. 3.  Questionable signal changes associated with the left internal auditory canal, could be artifact.  The possibility of a left vestibular schwannoma is not excluded.  Follow-up brain MRI with dedicated IAC protocol would confirm.  Impression #1 called to RN Erenest Rasher at 2107 hours on 05/15/2012.   Original Report Authenticated By:  Harley Hallmark, M.D.    Ct Hip Right Wo Contrast  05/21/2012  *RADIOLOGY REPORT*  Clinical Data: Right hip and leg pain.  Status post hardware fixation of a right hip fracture on 05/13/2012.  CT OF THE RIGHT HIP WITHOUT CONTRAST  Technique:  Multidetector CT imaging was performed according to the standard protocol. Multiplanar CT image reconstructions were also generated.  Comparison: Operative and preoperative right hip radiographs dated 05/13/2012.  Findings: Intramedullary rod and compression screw fixation of the previously demonstrated comminuted right intertrochanteric fracture.  Essentially anatomic position and alignment of the major fragments.  No new fracture or dislocation seen.  IMPRESSION: Satisfactory postoperative appearance of hardware fixation of a comminuted right intertrochanteric fracture.  No new fractures seen.   Original Report Authenticated By: Darrol Angel, M.D.    Nm Pulmonary Perf And Vent  05/16/2012  *RADIOLOGY REPORT*  Clinical Data:  78 year old female with severe hypoxia following surgery.  NUCLEAR MEDICINE VENTILATION - PERFUSION LUNG SCAN  Technique:  Wash-in, equilibrium, and wash-out phase ventilation images were obtained using Xe-133 gas.  Perfusion images were obtained in multiple projections after intravenous injection of Tc- 66m MAA.  Radiopharmaceuticals:  10.0  mCi Xe-133 gas and 6.0 mCi Tc-75m MAA.  Comparison:  Portable chest radiograph 05/15/2012.  Findings: Comparison chest radiograph demonstrates  no focal pulmonary opacity.  Ventilation imaging demonstrates Some artifact from the respiratory apparatus is noted.  There is diffuse gas trapping in both lungs. No ventilation defect.  Perfusion imaging demonstrates heterogeneous radiotracer distribution in the lungs, but no segmental defects. No mismatched defects.  IMPRESSION: Low probability of acute pulmonary embolus.   Original Report Authenticated By: Harley Hallmark, M.D.    US Venous Img Lower Unilateral  Right  05/20/2012  *RADIOLOGY REPORT*  Clinical Data: Pain  RIGHT LOWER EXTREMITY VENOUS DUPLEX ULTRASOUND  Technique:  Gray-scale sonography with graded compression, as well as color Doppler and duplex ultrasound, were performed to evaluate the deep venous system of the lower extremity from the level of the common femoral vein through the popliteal and proximal calf veins. Spectral Doppler was utilized to evaluate flow at rest and with distal augmentation maneuvers.  Comparison:  None.  Findings: The visualized right lower extremity deep venous system appears patent.  Normal compressibility.  Patent color Doppler flow.  Satisfactory spectral Doppler with respiratory variation and response to augmentation.  IMPRESSION: No deep venous thrombosis in the visualized right lower extremity.   Original Report Authenticated By: Charline Bills, M.D.    Dg Chest Port 1 View  05/24/2012  *RADIOLOGY REPORT*  Clinical Data: Fever and hypoxia.  PORTABLE CHEST - 1 VIEW  Comparison: 05/21/2012  Findings: Heart size is normal.  No pleural effusion or edema. Increased lung volumes and coarsened interstitial markings are noted compatible with COPD.  Scar is noted in the left base.  IMPRESSION:  1.  No acute cardiopulmonary abnormalities.   Original Report Authenticated By: Rosealee Albee, M.D.    Dg Chest Port 1 View  05/15/2012  *RADIOLOGY REPORT*  Clinical Data: Hypoxemia  PORTABLE CHEST - 1 VIEW  Comparison: 05/13/2012 and 08/01/2009  Findings: There is mild cardiomegaly.  Stable mild prominence of the central pulmonary arteries.  The lungs are clear.  No pleural effusion or pneumothorax is visible.  There extensive degenerative changes of the left humeral head.  The humeral head contour is irregular. There is apparent shortening of the distal left clavicle, stable. No acute bony abnormalities identified.  IMPRESSION:  1.  No acute cardiopulmonary disease.  Stable mild cardiomegaly. 2. Extensive  degenerative/arthrographic changes of the left humeral head.  The humeral head is markedly irregular and somewhat flattened.   Original Report Authenticated By: Britta Mccreedy, M.D.     Microbiology: Recent Results (from the past 240 hour(s))  URINE CULTURE     Status: Normal   Collection Time   05/15/12  3:19 PM      Component Value Range Status Comment   Specimen Description URINE, CLEAN CATCH   Final    Special Requests NONE   Final    Culture  Setup Time 05/15/2012 15:52   Final    Colony Count 1,000 COLONIES/ML   Final    Culture INSIGNIFICANT GROWTH   Final    Report Status 05/16/2012 FINAL   Final   CULTURE, BLOOD (ROUTINE X 2)     Status: Normal (Preliminary result)   Collection Time   05/21/12  8:17 PM      Component Value Range Status Comment   Specimen Description BLOOD RIGHT HAND   Final    Special Requests BOTTLES DRAWN AEROBIC AND ANAEROBIC 6CC   Final    Culture NO GROWTH 3  DAYS   Final    Report Status PENDING   Incomplete   CULTURE, BLOOD (ROUTINE X 2)     Status: Normal (Preliminary result)   Collection Time   05/21/12  8:18 PM      Component Value Range Status Comment   Specimen Description BLOOD RIGHT HAND   Final    Special Requests BOTTLES DRAWN AEROBIC AND ANAEROBIC [facility]   Final    Culture NO GROWTH 3 DAYS   Final    Report Status PENDING   Incomplete      Labs: Basic Metabolic Panel:  Lab 05/24/12 1610 05/23/12 0527 05/22/12 0528 05/21/12 1225  NA 131* 132* 132* 133*  K 4.1 4.2 4.1 4.6  CL 99 99 98 97  CO2 27 25 26 28   GLUCOSE 102* 91 113* 95  BUN 26* 25* 27* 33*  CREATININE 1.22* 1.16* 1.17* 1.11*  CALCIUM 8.7 8.6 8.6 9.2  MG -- -- -- --  PHOS -- -- -- --   Liver Function Tests:  Lab 05/22/12 0528  AST 17  ALT 7  ALKPHOS 60  BILITOT 0.8  PROT 5.4*  ALBUMIN 2.4*   No results found for this basename: LIPASE:5,AMYLASE:5 in the last 168 hours No results found for this basename: AMMONIA:5 in the last 168 hours CBC:  Lab 05/24/12 0530  05/23/12 0527 05/22/12 0528 05/21/12 1225  WBC 8.8 9.9 10.1 10.5  NEUTROABS -- -- -- 7.6  HGB 10.3* 8.9* 7.2* 8.6*  HCT 31.0* 26.5* 21.6* 26.1*  MCV 92.8 93.6 94.7 96.3  PLT 373 355 376 385   Cardiac Enzymes: No results found for this basename: CKTOTAL:5,CKMB:5,CKMBINDEX:5,TROPONINI:5 in the last 168 hours BNP: BNP (last 3 results) No results found for this basename: PROBNP:3 in the last 8760 hours CBG: No results found for this basename: GLUCAP:5 in the last 168 hours  Time coordinating discharge: greater than 30 minutes  Signed:  MEMON,JEHANZEB  [facility] 05/24/2012, 12:56 PM

## 2012-05-24 NOTE — Clinical Social Work Note (Signed)
Pt d/c today back to [facility]. Pt, daughter-in-law, and facility aware and agreeable. D/C summary faxed. Pt to transfer with RN.  Derenda Fennel, [place] [phone]

## 2012-05-24 NOTE — Progress Notes (Signed)
1 unit of PRBCs completed without complications. Sheryn Bison

## 2012-05-24 NOTE — Plan of Care (Signed)
Problem: Phase I Progression Outcomes Goal: OOB as tolerated unless otherwise ordered Outcome: Not Progressing Patient not getting OOB with PT d/t hip repair pain  Problem: Phase II Progression Outcomes Goal: Progress activity as tolerated unless otherwise ordered Outcome: Completed/Met Date Met:  05/24/12 PT doing bed exercises

## 2012-05-24 NOTE — Progress Notes (Signed)
Patient discharged back to [facility].  Report called and given.  IV removed - WNL.  Foley removed - WNL.  Patient dressed in transfer gown for transport per family request and patients pain level with mobility.  Packet sent with patient.

## 2012-05-25 DIAGNOSIS — S72143A Displaced intertrochanteric fracture of unspecified femur, initial encounter for closed fracture: Secondary | ICD-10-CM | POA: Diagnosis not present

## 2012-05-25 LAB — TYPE AND SCREEN
Antibody Screen: POSITIVE
Donor AG Type: NEGATIVE
Unit division: 0
Unit division: 0

## 2012-05-26 LAB — CULTURE, BLOOD (ROUTINE X 2)
Culture: NO GROWTH
Culture: NO GROWTH

## 2012-05-29 DIAGNOSIS — F0390 Unspecified dementia without behavioral disturbance: Secondary | ICD-10-CM | POA: Diagnosis not present

## 2012-05-29 DIAGNOSIS — F411 Generalized anxiety disorder: Secondary | ICD-10-CM | POA: Diagnosis not present

## 2012-05-30 DIAGNOSIS — D62 Acute posthemorrhagic anemia: Secondary | ICD-10-CM | POA: Diagnosis not present

## 2012-05-30 DIAGNOSIS — K922 Gastrointestinal hemorrhage, unspecified: Secondary | ICD-10-CM | POA: Diagnosis not present

## 2012-05-30 DIAGNOSIS — S72143A Displaced intertrochanteric fracture of unspecified femur, initial encounter for closed fracture: Secondary | ICD-10-CM | POA: Diagnosis not present

## 2012-06-04 DIAGNOSIS — S72143A Displaced intertrochanteric fracture of unspecified femur, initial encounter for closed fracture: Secondary | ICD-10-CM | POA: Diagnosis not present

## 2012-06-06 DIAGNOSIS — K922 Gastrointestinal hemorrhage, unspecified: Secondary | ICD-10-CM | POA: Diagnosis not present

## 2012-06-06 DIAGNOSIS — D62 Acute posthemorrhagic anemia: Secondary | ICD-10-CM | POA: Diagnosis not present

## 2012-06-06 DIAGNOSIS — S72143A Displaced intertrochanteric fracture of unspecified femur, initial encounter for closed fracture: Secondary | ICD-10-CM | POA: Diagnosis not present

## 2012-07-04 DIAGNOSIS — Z9889 Other specified postprocedural states: Secondary | ICD-10-CM | POA: Diagnosis not present

## 2012-07-22 DIAGNOSIS — G9340 Encephalopathy, unspecified: Secondary | ICD-10-CM | POA: Diagnosis not present

## 2012-07-22 DIAGNOSIS — D62 Acute posthemorrhagic anemia: Secondary | ICD-10-CM | POA: Diagnosis not present

## 2012-07-22 DIAGNOSIS — K319 Disease of stomach and duodenum, unspecified: Secondary | ICD-10-CM | POA: Diagnosis not present

## 2012-07-22 DIAGNOSIS — I10 Essential (primary) hypertension: Secondary | ICD-10-CM | POA: Diagnosis not present

## 2012-07-22 DIAGNOSIS — F0390 Unspecified dementia without behavioral disturbance: Secondary | ICD-10-CM | POA: Diagnosis not present

## 2012-07-22 DIAGNOSIS — E039 Hypothyroidism, unspecified: Secondary | ICD-10-CM | POA: Diagnosis not present

## 2012-07-22 DIAGNOSIS — Z87828 Personal history of other (healed) physical injury and trauma: Secondary | ICD-10-CM | POA: Diagnosis not present

## 2012-07-22 DIAGNOSIS — S72009D Fracture of unspecified part of neck of unspecified femur, subsequent encounter for closed fracture with routine healing: Secondary | ICD-10-CM | POA: Diagnosis not present

## 2012-07-22 DIAGNOSIS — Z5189 Encounter for other specified aftercare: Secondary | ICD-10-CM | POA: Diagnosis not present

## 2012-07-27 DIAGNOSIS — M6281 Muscle weakness (generalized): Secondary | ICD-10-CM | POA: Diagnosis not present

## 2012-07-27 DIAGNOSIS — I10 Essential (primary) hypertension: Secondary | ICD-10-CM | POA: Diagnosis not present

## 2012-07-27 DIAGNOSIS — F0390 Unspecified dementia without behavioral disturbance: Secondary | ICD-10-CM | POA: Diagnosis not present

## 2012-07-27 DIAGNOSIS — D649 Anemia, unspecified: Secondary | ICD-10-CM | POA: Diagnosis not present

## 2012-07-27 DIAGNOSIS — S72009D Fracture of unspecified part of neck of unspecified femur, subsequent encounter for closed fracture with routine healing: Secondary | ICD-10-CM | POA: Diagnosis not present

## 2012-07-30 ENCOUNTER — Encounter (HOSPITAL_COMMUNITY): Payer: Self-pay | Admitting: Emergency Medicine

## 2012-07-30 ENCOUNTER — Emergency Department (HOSPITAL_COMMUNITY): Payer: Medicare Other

## 2012-07-30 ENCOUNTER — Emergency Department (HOSPITAL_COMMUNITY)
Admission: EM | Admit: 2012-07-30 | Discharge: 2012-07-30 | Disposition: A | Discharge status: Home / Self Care | Payer: Medicare Other | Attending: Emergency Medicine | Admitting: Emergency Medicine

## 2012-07-30 DIAGNOSIS — I10 Essential (primary) hypertension: Secondary | ICD-10-CM | POA: Diagnosis not present

## 2012-07-30 DIAGNOSIS — E079 Disorder of thyroid, unspecified: Secondary | ICD-10-CM | POA: Diagnosis not present

## 2012-07-30 DIAGNOSIS — R42 Dizziness and giddiness: Secondary | ICD-10-CM | POA: Insufficient documentation

## 2012-07-30 DIAGNOSIS — D649 Anemia, unspecified: Secondary | ICD-10-CM | POA: Diagnosis not present

## 2012-07-30 DIAGNOSIS — F0390 Unspecified dementia without behavioral disturbance: Secondary | ICD-10-CM | POA: Diagnosis not present

## 2012-07-30 DIAGNOSIS — Z8719 Personal history of other diseases of the digestive system: Secondary | ICD-10-CM | POA: Diagnosis not present

## 2012-07-30 DIAGNOSIS — Z79899 Other long term (current) drug therapy: Secondary | ICD-10-CM | POA: Diagnosis not present

## 2012-07-30 DIAGNOSIS — N390 Urinary tract infection, site not specified: Secondary | ICD-10-CM | POA: Diagnosis not present

## 2012-07-30 DIAGNOSIS — R404 Transient alteration of awareness: Secondary | ICD-10-CM | POA: Diagnosis not present

## 2012-07-30 HISTORY — DX: Dizziness and giddiness: R42

## 2012-07-30 LAB — URINE MICROSCOPIC-ADD ON

## 2012-07-30 LAB — COMPREHENSIVE METABOLIC PANEL
Alkaline Phosphatase: 96 U/L (ref 39–117)
BUN: 39 mg/dL — ABNORMAL HIGH (ref 6–23)
GFR calc Af Amer: 39 mL/min — ABNORMAL LOW (ref >90)
GFR calc non Af Amer: 34 mL/min — ABNORMAL LOW (ref >90)
Glucose, Bld: 95 mg/dL (ref 70–99)
Potassium: 4.1 mEq/L (ref 3.5–5.1)
Total Protein: 6.7 g/dL (ref 6.0–8.3)

## 2012-07-30 LAB — URINALYSIS, ROUTINE W REFLEX MICROSCOPIC
Bilirubin Urine: NEGATIVE
Ketones, ur: NEGATIVE mg/dL
Nitrite: POSITIVE — AB
Protein, ur: NEGATIVE mg/dL
Specific Gravity, Urine: 1.01 (ref 1.005–1.030)
Urobilinogen, UA: 0.2 mg/dL (ref 0.0–1.0)

## 2012-07-30 LAB — CBC WITH DIFFERENTIAL/PLATELET
Eosinophils Absolute: 0.3 10*3/uL (ref 0.0–0.7)
Eosinophils Relative: 3 % (ref 0–5)
Hemoglobin: 12.3 g/dL (ref 12.0–15.0)
Lymphs Abs: 1.2 10*3/uL (ref 0.7–4.0)
MCH: 31.7 pg (ref 26.0–34.0)
MCV: 97.2 fL (ref 78.0–100.0)
Monocytes Relative: 7 % (ref 3–12)
Neutrophils Relative %: 74 % (ref 43–77)
RBC: 3.88 MIL/uL (ref 3.87–5.11)

## 2012-07-30 LAB — TROPONIN I: Troponin I: <0.3 ng/mL (ref <0.30)

## 2012-07-30 MED ORDER — CIPROFLOXACIN HCL 500 MG PO TABS: 500.0000 mg | ORAL_TABLET | Freq: Two times a day (BID) | ORAL | Status: DC

## 2012-07-30 MED ORDER — MECLIZINE HCL 12.5 MG PO TABS
25.0000 mg | ORAL_TABLET | Freq: Once | ORAL | Status: AC
  Administered 2012-07-30: 25 mg via ORAL
  Filled 2012-07-30: qty 2

## 2012-07-30 MED ORDER — MECLIZINE HCL 25 MG PO TABS: 25.0000 mg | ORAL_TABLET | Freq: Three times a day (TID) | ORAL | Status: DC | PRN

## 2012-07-30 NOTE — ED Notes (Signed)
hisotry of same. Pt states dizziness with movement.

## 2012-07-30 NOTE — ED Notes (Signed)
Pt just returned from xray 

## 2012-07-30 NOTE — ED Notes (Signed)
Pt is still having some dizziness with movement.

## 2012-07-30 NOTE — ED Provider Notes (Signed)
History   This chart was scribed for Geoffery Lyons, MD by Charolett Bumpers, ED Scribe. The patient was seen in room APA02/APA02. Patient's care was started at 1143.   CSN: [id number]  Arrival date & time 07/30/12  1051   First MD Initiated Contact with Patient 07/30/12 1143      Chief Complaint  Patient presents with  . Dizziness    The history is provided by the patient and a relative. No language interpreter was used.   [name] is a 78 y.o. female who presents to the Emergency Department complaining of intermittent, moderate dizziness that started today. She describes the dizziness as a spinning sensation that is worse with sitting up and movement. She has a h/o vertigo and states her symptoms today feel similar. Family reports a h/o UTI. She currently lives at [facility] and was recently released from the [facility] after a hip fracture. Family states she had 4 units of blood transfused afterwards. Family states she hasn't been ambulating independently since the fracture. She denies any headache, vomiting, fevers, abdominal pain. Family denies any recent medication changes. She has a h/o Dementia, HTN, anemia and vertigo.   Past Medical History  Diagnosis Date  . Thyroid disease   . Hypertension   . Dementia   . Anemia   . Gastric ulcer     gi bleed secondary  . Renal disorder   . Vertigo     Past Surgical History  Procedure Date  . Thyroid surgery   . Mastectomy   . Carpal tunnel release   . Abdominal hysterectomy   . Femur im nail 05/13/2012    Procedure: INTRAMEDULLARY (IM) NAIL FEMORAL;  Surgeon: Senaida Lange, MD;  Location: [facility] OR;  Service: Orthopedics;  Laterality: Right;    No family history on file.  History  Substance Use Topics  . Smoking status: Never Smoker   . Smokeless tobacco: Not on file  . Alcohol Use: No    OB History    Grav Para Term Preterm Abortions TAB SAB Ect Mult Living                  Review of Systems   Constitutional: Negative for fever.  Gastrointestinal: Negative for vomiting and abdominal pain.  Neurological: Positive for dizziness. Negative for headaches.  All other systems reviewed and are negative.    Allergies  Amoxicillin-pot clavulanate and Cephalexin  Home Medications   Current Outpatient Rx  Name  Route  Sig  Dispense  Refill  . ACETAMINOPHEN 500 MG PO TABS   Oral   Take 1,000 mg by mouth at bedtime.         . ALPRAZOLAM 0.25 MG PO TABS   Oral   Take 1 tablet (0.25 mg total) by mouth 2 (two) times daily.   10 tablet   0   . BIOTIN 300 MCG PO TABS   Oral   Take 600 mcg by mouth daily.         Marland Kitchen CALCIUM CARBONATE-VITAMIN D 500-200 MG-UNIT PO TABS   Oral   Take 1 tablet by mouth daily.         . DONEPEZIL HCL 10 MG PO TABS   Oral   Take 10 mg by mouth daily.          Marland Kitchen ESCITALOPRAM OXALATE 10 MG PO TABS   Oral   Take 10 mg by mouth daily.         Marland Kitchen ENSURE  COMPLETE PO LIQD   Oral   Take 237 mLs by mouth as needed (for nutritional supplementation).         Di Kindle SULFATE 325 (65 FE) MG PO TABS   Oral   Take 1 tablet (325 mg total) by mouth 3 (three) times daily with meals.         Marland Kitchen LEVOTHYROXINE SODIUM 125 MCG PO TABS   Oral   Take 125 mcg by mouth daily.         Marland Kitchen LOSARTAN POTASSIUM 50 MG PO TABS   Oral   Take 50 mg by mouth daily.         . LUTEIN 6 MG PO CAPS   Oral   Take 1 capsule by mouth daily.         Marland Kitchen MEMANTINE HCL 10 MG PO TABS   Oral   Take 10 mg by mouth 2 (two) times daily.         Marland Kitchen DAILY VITE PO TABS   Oral   Take 1 tablet by mouth daily.         Marland Kitchen PANTOPRAZOLE SODIUM 40 MG PO TBEC   Oral   Take 40 mg by mouth daily at 12 noon.         . SENNA 8.6 MG PO TABS   Oral   Take 1 tablet (8.6 mg total) by mouth 2 (two) times daily.   120 each      . MAGNESIUM HYDROXIDE 400 MG/5ML PO SUSP   Oral   Take 30 mLs by mouth daily as needed. Constipation.         Marland Kitchen METAXALONE 800 MG PO TABS    Oral   Take 800 mg by mouth 3 (three) times daily as needed. Muscle spasms.         . TRAMADOL HCL 50 MG PO TABS   Oral   Take 50 mg by mouth every 6 (six) hours as needed. Pain.           BP 171/71  Pulse 63  Temp 98.1 F (36.7 C) (Oral)  Resp 14  SpO2 96%  Physical Exam  Nursing note and vitals reviewed. Constitutional: She is oriented to person, place, and time. She appears well-developed and well-nourished. No distress.  HENT:  Head: Normocephalic and atraumatic.  Eyes: Conjunctivae normal and EOM are normal. Pupils are equal, round, and reactive to light.  Neck: Normal range of motion. Neck supple. No tracheal deviation present.  Cardiovascular: Normal rate, regular rhythm and normal heart sounds.   No murmur heard. Pulmonary/Chest: Effort normal and breath sounds normal. No respiratory distress. She has no wheezes.  Abdominal: Soft. Bowel sounds are normal. She exhibits no distension. There is no tenderness.  Musculoskeletal: Normal range of motion. She exhibits no edema.  Neurological: She is alert and oriented to person, place, and time. No cranial nerve deficit.       Horizontal nystagmus noted. Strength and cranial nerves intact.   Skin: Skin is warm and dry.  Psychiatric: She has a normal mood and affect. Her behavior is normal.    ED Course  Procedures (including critical care time)  DIAGNOSTIC STUDIES: Oxygen Saturation is 96% on room air, adequate by my interpretation.    COORDINATION OF CARE:  11:50-Discussed planned course of treatment with the patient and family including Antivert, CT of head, blood work and UA, who are agreeable at this time.   12:00-Medication Orders: Meclizine (Antivert) tablet 25 mg-once  Labs  Reviewed  COMPREHENSIVE METABOLIC PANEL - Abnormal; Notable for the following:    BUN 39 (*)     Creatinine, Ser 1.34 (*)     GFR calc non Af Amer 34 (*)     GFR calc Af Amer 39 (*)     All other components within normal limits   URINALYSIS, ROUTINE W REFLEX MICROSCOPIC - Abnormal; Notable for the following:    Nitrite POSITIVE (*)     Leukocytes, UA TRACE (*)     All other components within normal limits  URINE MICROSCOPIC-ADD ON - Abnormal; Notable for the following:    Bacteria, UA MANY (*)     All other components within normal limits  CBC WITH DIFFERENTIAL  TROPONIN I  URINE CULTURE   Ct Head Wo Contrast  07/30/2012  *RADIOLOGY REPORT*  Clinical Data: Dizziness began today.  History of vertigo, hypertension, dementia.  Recent hip fracture.  CT HEAD WITHOUT CONTRAST  Technique:  Contiguous axial images were obtained from the base of the skull through the vertex without contrast.  Comparison: 05/21/2012 CT.  MRI 05/15/2012.  Findings: There is no evidence for acute infarction, intracranial hemorrhage, mass lesion, hydrocephalus, or extra-axial fluid. Moderate atrophy with chronic microvascular ischemic change is again noted.  The previously identified trace subdural seen on MR is not seen today.  Calvarium is intact.  Clear sinuses and mastoids.  Similar appearance to September.  IMPRESSION: Atrophy with chronic microvascular ischemic change.  No acute intracranial abnormality.   Original Report Authenticated By: Davonna Belling, M.D.      No diagnosis found.   Date: 07/30/2012  Rate: 57  Rhythm: normal sinus rhythm  QRS Axis: left  Intervals: normal  ST/T Wave abnormalities: normal  Conduction Disutrbances:none  Narrative Interpretation:   Old EKG Reviewed: unchanged    MDM  The patient presents with symptoms and physical findings consistent with a peripheral vertigo.  The workup fails to reveal an alternate diagnosis.  She is feeling better with meclizine and will be discharged to home with the same.  To return prn.     I personally performed the services described in this documentation, which was scribed in my presence. The recorded information has been reviewed and is accurate.         Geoffery Lyons, MD 07/30/12 1415

## 2012-07-30 NOTE — ED Notes (Signed)
Pt c/o waking up with dizziness when she sits up or moves. Pt states she has a history of the same.

## 2012-07-31 DIAGNOSIS — S72009D Fracture of unspecified part of neck of unspecified femur, subsequent encounter for closed fracture with routine healing: Secondary | ICD-10-CM | POA: Diagnosis not present

## 2012-07-31 DIAGNOSIS — D649 Anemia, unspecified: Secondary | ICD-10-CM | POA: Diagnosis not present

## 2012-07-31 DIAGNOSIS — I10 Essential (primary) hypertension: Secondary | ICD-10-CM | POA: Diagnosis not present

## 2012-07-31 DIAGNOSIS — M6281 Muscle weakness (generalized): Secondary | ICD-10-CM | POA: Diagnosis not present

## 2012-07-31 DIAGNOSIS — F0390 Unspecified dementia without behavioral disturbance: Secondary | ICD-10-CM | POA: Diagnosis not present

## 2012-08-01 LAB — URINE CULTURE: Colony Count: >=100000

## 2012-08-02 DIAGNOSIS — F0390 Unspecified dementia without behavioral disturbance: Secondary | ICD-10-CM | POA: Diagnosis not present

## 2012-08-02 DIAGNOSIS — S72009D Fracture of unspecified part of neck of unspecified femur, subsequent encounter for closed fracture with routine healing: Secondary | ICD-10-CM | POA: Diagnosis not present

## 2012-08-02 DIAGNOSIS — D649 Anemia, unspecified: Secondary | ICD-10-CM | POA: Diagnosis not present

## 2012-08-02 DIAGNOSIS — M6281 Muscle weakness (generalized): Secondary | ICD-10-CM | POA: Diagnosis not present

## 2012-08-02 DIAGNOSIS — I10 Essential (primary) hypertension: Secondary | ICD-10-CM | POA: Diagnosis not present

## 2012-08-02 NOTE — ED Notes (Addendum)
+   Urine Cipro-sensitive to same-chart appended per protocol MD.

## 2012-08-03 DIAGNOSIS — M6281 Muscle weakness (generalized): Secondary | ICD-10-CM | POA: Diagnosis not present

## 2012-08-03 DIAGNOSIS — D649 Anemia, unspecified: Secondary | ICD-10-CM | POA: Diagnosis not present

## 2012-08-03 DIAGNOSIS — F0390 Unspecified dementia without behavioral disturbance: Secondary | ICD-10-CM | POA: Diagnosis not present

## 2012-08-03 DIAGNOSIS — S72009D Fracture of unspecified part of neck of unspecified femur, subsequent encounter for closed fracture with routine healing: Secondary | ICD-10-CM | POA: Diagnosis not present

## 2012-08-03 DIAGNOSIS — I10 Essential (primary) hypertension: Secondary | ICD-10-CM | POA: Diagnosis not present

## 2012-08-07 DIAGNOSIS — I10 Essential (primary) hypertension: Secondary | ICD-10-CM | POA: Diagnosis not present

## 2012-08-07 DIAGNOSIS — F0390 Unspecified dementia without behavioral disturbance: Secondary | ICD-10-CM | POA: Diagnosis not present

## 2012-08-07 DIAGNOSIS — M6281 Muscle weakness (generalized): Secondary | ICD-10-CM | POA: Diagnosis not present

## 2012-08-07 DIAGNOSIS — D649 Anemia, unspecified: Secondary | ICD-10-CM | POA: Diagnosis not present

## 2012-08-07 DIAGNOSIS — S72009D Fracture of unspecified part of neck of unspecified femur, subsequent encounter for closed fracture with routine healing: Secondary | ICD-10-CM | POA: Diagnosis not present

## 2012-08-09 DIAGNOSIS — F0390 Unspecified dementia without behavioral disturbance: Secondary | ICD-10-CM | POA: Diagnosis not present

## 2012-08-09 DIAGNOSIS — I10 Essential (primary) hypertension: Secondary | ICD-10-CM | POA: Diagnosis not present

## 2012-08-09 DIAGNOSIS — D649 Anemia, unspecified: Secondary | ICD-10-CM | POA: Diagnosis not present

## 2012-08-09 DIAGNOSIS — M6281 Muscle weakness (generalized): Secondary | ICD-10-CM | POA: Diagnosis not present

## 2012-08-09 DIAGNOSIS — S72009D Fracture of unspecified part of neck of unspecified femur, subsequent encounter for closed fracture with routine healing: Secondary | ICD-10-CM | POA: Diagnosis not present

## 2012-08-10 DIAGNOSIS — E039 Hypothyroidism, unspecified: Secondary | ICD-10-CM | POA: Diagnosis not present

## 2012-08-10 DIAGNOSIS — E119 Type 2 diabetes mellitus without complications: Secondary | ICD-10-CM | POA: Diagnosis not present

## 2012-08-10 DIAGNOSIS — F0280 Dementia in other diseases classified elsewhere without behavioral disturbance: Secondary | ICD-10-CM | POA: Diagnosis not present

## 2012-08-10 DIAGNOSIS — I10 Essential (primary) hypertension: Secondary | ICD-10-CM | POA: Diagnosis not present

## 2012-08-10 DIAGNOSIS — IMO0002 Reserved for concepts with insufficient information to code with codable children: Secondary | ICD-10-CM | POA: Diagnosis not present

## 2012-08-13 DIAGNOSIS — F0390 Unspecified dementia without behavioral disturbance: Secondary | ICD-10-CM | POA: Diagnosis not present

## 2012-08-13 DIAGNOSIS — D649 Anemia, unspecified: Secondary | ICD-10-CM | POA: Diagnosis not present

## 2012-08-13 DIAGNOSIS — M6281 Muscle weakness (generalized): Secondary | ICD-10-CM | POA: Diagnosis not present

## 2012-08-13 DIAGNOSIS — I10 Essential (primary) hypertension: Secondary | ICD-10-CM | POA: Diagnosis not present

## 2012-08-13 DIAGNOSIS — S72009D Fracture of unspecified part of neck of unspecified femur, subsequent encounter for closed fracture with routine healing: Secondary | ICD-10-CM | POA: Diagnosis not present

## 2012-08-14 DIAGNOSIS — F0390 Unspecified dementia without behavioral disturbance: Secondary | ICD-10-CM | POA: Diagnosis not present

## 2012-08-14 DIAGNOSIS — I10 Essential (primary) hypertension: Secondary | ICD-10-CM | POA: Diagnosis not present

## 2012-08-14 DIAGNOSIS — M6281 Muscle weakness (generalized): Secondary | ICD-10-CM | POA: Diagnosis not present

## 2012-08-14 DIAGNOSIS — S72009D Fracture of unspecified part of neck of unspecified femur, subsequent encounter for closed fracture with routine healing: Secondary | ICD-10-CM | POA: Diagnosis not present

## 2012-08-14 DIAGNOSIS — D649 Anemia, unspecified: Secondary | ICD-10-CM | POA: Diagnosis not present

## 2012-08-16 DIAGNOSIS — S72009D Fracture of unspecified part of neck of unspecified femur, subsequent encounter for closed fracture with routine healing: Secondary | ICD-10-CM | POA: Diagnosis not present

## 2012-08-16 DIAGNOSIS — F0390 Unspecified dementia without behavioral disturbance: Secondary | ICD-10-CM | POA: Diagnosis not present

## 2012-08-16 DIAGNOSIS — M6281 Muscle weakness (generalized): Secondary | ICD-10-CM | POA: Diagnosis not present

## 2012-08-16 DIAGNOSIS — D649 Anemia, unspecified: Secondary | ICD-10-CM | POA: Diagnosis not present

## 2012-08-16 DIAGNOSIS — I10 Essential (primary) hypertension: Secondary | ICD-10-CM | POA: Diagnosis not present

## 2012-08-20 DIAGNOSIS — S72009D Fracture of unspecified part of neck of unspecified femur, subsequent encounter for closed fracture with routine healing: Secondary | ICD-10-CM | POA: Diagnosis not present

## 2012-08-20 DIAGNOSIS — M6281 Muscle weakness (generalized): Secondary | ICD-10-CM | POA: Diagnosis not present

## 2012-08-20 DIAGNOSIS — F0390 Unspecified dementia without behavioral disturbance: Secondary | ICD-10-CM | POA: Diagnosis not present

## 2012-08-20 DIAGNOSIS — D649 Anemia, unspecified: Secondary | ICD-10-CM | POA: Diagnosis not present

## 2012-08-20 DIAGNOSIS — I10 Essential (primary) hypertension: Secondary | ICD-10-CM | POA: Diagnosis not present

## 2012-08-21 DIAGNOSIS — D649 Anemia, unspecified: Secondary | ICD-10-CM | POA: Diagnosis not present

## 2012-08-21 DIAGNOSIS — I10 Essential (primary) hypertension: Secondary | ICD-10-CM | POA: Diagnosis not present

## 2012-08-21 DIAGNOSIS — F0390 Unspecified dementia without behavioral disturbance: Secondary | ICD-10-CM | POA: Diagnosis not present

## 2012-08-21 DIAGNOSIS — S72009D Fracture of unspecified part of neck of unspecified femur, subsequent encounter for closed fracture with routine healing: Secondary | ICD-10-CM | POA: Diagnosis not present

## 2012-08-21 DIAGNOSIS — M6281 Muscle weakness (generalized): Secondary | ICD-10-CM | POA: Diagnosis not present

## 2012-08-24 DIAGNOSIS — F0390 Unspecified dementia without behavioral disturbance: Secondary | ICD-10-CM | POA: Diagnosis not present

## 2012-08-24 DIAGNOSIS — D649 Anemia, unspecified: Secondary | ICD-10-CM | POA: Diagnosis not present

## 2012-08-24 DIAGNOSIS — I10 Essential (primary) hypertension: Secondary | ICD-10-CM | POA: Diagnosis not present

## 2012-08-24 DIAGNOSIS — S72009D Fracture of unspecified part of neck of unspecified femur, subsequent encounter for closed fracture with routine healing: Secondary | ICD-10-CM | POA: Diagnosis not present

## 2012-08-24 DIAGNOSIS — M6281 Muscle weakness (generalized): Secondary | ICD-10-CM | POA: Diagnosis not present

## 2012-08-27 DIAGNOSIS — F0390 Unspecified dementia without behavioral disturbance: Secondary | ICD-10-CM | POA: Diagnosis not present

## 2012-08-27 DIAGNOSIS — S72009D Fracture of unspecified part of neck of unspecified femur, subsequent encounter for closed fracture with routine healing: Secondary | ICD-10-CM | POA: Diagnosis not present

## 2012-08-27 DIAGNOSIS — M6281 Muscle weakness (generalized): Secondary | ICD-10-CM | POA: Diagnosis not present

## 2012-08-27 DIAGNOSIS — I10 Essential (primary) hypertension: Secondary | ICD-10-CM | POA: Diagnosis not present

## 2012-08-27 DIAGNOSIS — D649 Anemia, unspecified: Secondary | ICD-10-CM | POA: Diagnosis not present

## 2012-08-28 DIAGNOSIS — F0390 Unspecified dementia without behavioral disturbance: Secondary | ICD-10-CM | POA: Diagnosis not present

## 2012-08-28 DIAGNOSIS — M6281 Muscle weakness (generalized): Secondary | ICD-10-CM | POA: Diagnosis not present

## 2012-08-28 DIAGNOSIS — D649 Anemia, unspecified: Secondary | ICD-10-CM | POA: Diagnosis not present

## 2012-08-28 DIAGNOSIS — I10 Essential (primary) hypertension: Secondary | ICD-10-CM | POA: Diagnosis not present

## 2012-08-28 DIAGNOSIS — S72009D Fracture of unspecified part of neck of unspecified femur, subsequent encounter for closed fracture with routine healing: Secondary | ICD-10-CM | POA: Diagnosis not present

## 2012-08-29 DIAGNOSIS — D649 Anemia, unspecified: Secondary | ICD-10-CM | POA: Diagnosis not present

## 2012-08-29 DIAGNOSIS — I10 Essential (primary) hypertension: Secondary | ICD-10-CM | POA: Diagnosis not present

## 2012-08-29 DIAGNOSIS — M6281 Muscle weakness (generalized): Secondary | ICD-10-CM | POA: Diagnosis not present

## 2012-08-29 DIAGNOSIS — S72009D Fracture of unspecified part of neck of unspecified femur, subsequent encounter for closed fracture with routine healing: Secondary | ICD-10-CM | POA: Diagnosis not present

## 2012-08-29 DIAGNOSIS — F0390 Unspecified dementia without behavioral disturbance: Secondary | ICD-10-CM | POA: Diagnosis not present

## 2012-08-30 DIAGNOSIS — F0390 Unspecified dementia without behavioral disturbance: Secondary | ICD-10-CM | POA: Diagnosis not present

## 2012-08-30 DIAGNOSIS — S72009D Fracture of unspecified part of neck of unspecified femur, subsequent encounter for closed fracture with routine healing: Secondary | ICD-10-CM | POA: Diagnosis not present

## 2012-08-30 DIAGNOSIS — D649 Anemia, unspecified: Secondary | ICD-10-CM | POA: Diagnosis not present

## 2012-08-30 DIAGNOSIS — I10 Essential (primary) hypertension: Secondary | ICD-10-CM | POA: Diagnosis not present

## 2012-08-30 DIAGNOSIS — M6281 Muscle weakness (generalized): Secondary | ICD-10-CM | POA: Diagnosis not present

## 2012-09-03 DIAGNOSIS — M6281 Muscle weakness (generalized): Secondary | ICD-10-CM | POA: Diagnosis not present

## 2012-09-03 DIAGNOSIS — F0390 Unspecified dementia without behavioral disturbance: Secondary | ICD-10-CM | POA: Diagnosis not present

## 2012-09-03 DIAGNOSIS — S72009D Fracture of unspecified part of neck of unspecified femur, subsequent encounter for closed fracture with routine healing: Secondary | ICD-10-CM | POA: Diagnosis not present

## 2012-09-03 DIAGNOSIS — D649 Anemia, unspecified: Secondary | ICD-10-CM | POA: Diagnosis not present

## 2012-09-03 DIAGNOSIS — I10 Essential (primary) hypertension: Secondary | ICD-10-CM | POA: Diagnosis not present

## 2012-09-04 DIAGNOSIS — I10 Essential (primary) hypertension: Secondary | ICD-10-CM | POA: Diagnosis not present

## 2012-09-04 DIAGNOSIS — S72009D Fracture of unspecified part of neck of unspecified femur, subsequent encounter for closed fracture with routine healing: Secondary | ICD-10-CM | POA: Diagnosis not present

## 2012-09-04 DIAGNOSIS — M6281 Muscle weakness (generalized): Secondary | ICD-10-CM | POA: Diagnosis not present

## 2012-09-04 DIAGNOSIS — F0390 Unspecified dementia without behavioral disturbance: Secondary | ICD-10-CM | POA: Diagnosis not present

## 2012-09-04 DIAGNOSIS — D649 Anemia, unspecified: Secondary | ICD-10-CM | POA: Diagnosis not present

## 2012-09-06 DIAGNOSIS — M6281 Muscle weakness (generalized): Secondary | ICD-10-CM | POA: Diagnosis not present

## 2012-09-06 DIAGNOSIS — S72009D Fracture of unspecified part of neck of unspecified femur, subsequent encounter for closed fracture with routine healing: Secondary | ICD-10-CM | POA: Diagnosis not present

## 2012-09-06 DIAGNOSIS — I10 Essential (primary) hypertension: Secondary | ICD-10-CM | POA: Diagnosis not present

## 2012-09-06 DIAGNOSIS — F0390 Unspecified dementia without behavioral disturbance: Secondary | ICD-10-CM | POA: Diagnosis not present

## 2012-09-06 DIAGNOSIS — D649 Anemia, unspecified: Secondary | ICD-10-CM | POA: Diagnosis not present

## 2012-09-07 DIAGNOSIS — S72009D Fracture of unspecified part of neck of unspecified femur, subsequent encounter for closed fracture with routine healing: Secondary | ICD-10-CM | POA: Diagnosis not present

## 2012-09-07 DIAGNOSIS — F0390 Unspecified dementia without behavioral disturbance: Secondary | ICD-10-CM | POA: Diagnosis not present

## 2012-09-07 DIAGNOSIS — D649 Anemia, unspecified: Secondary | ICD-10-CM | POA: Diagnosis not present

## 2012-09-07 DIAGNOSIS — M6281 Muscle weakness (generalized): Secondary | ICD-10-CM | POA: Diagnosis not present

## 2012-09-07 DIAGNOSIS — I10 Essential (primary) hypertension: Secondary | ICD-10-CM | POA: Diagnosis not present

## 2012-09-10 DIAGNOSIS — S72009D Fracture of unspecified part of neck of unspecified femur, subsequent encounter for closed fracture with routine healing: Secondary | ICD-10-CM | POA: Diagnosis not present

## 2012-09-10 DIAGNOSIS — F0390 Unspecified dementia without behavioral disturbance: Secondary | ICD-10-CM | POA: Diagnosis not present

## 2012-09-10 DIAGNOSIS — M6281 Muscle weakness (generalized): Secondary | ICD-10-CM | POA: Diagnosis not present

## 2012-09-10 DIAGNOSIS — D649 Anemia, unspecified: Secondary | ICD-10-CM | POA: Diagnosis not present

## 2012-09-10 DIAGNOSIS — I10 Essential (primary) hypertension: Secondary | ICD-10-CM | POA: Diagnosis not present

## 2012-09-13 DIAGNOSIS — M6281 Muscle weakness (generalized): Secondary | ICD-10-CM | POA: Diagnosis not present

## 2012-09-13 DIAGNOSIS — S72009D Fracture of unspecified part of neck of unspecified femur, subsequent encounter for closed fracture with routine healing: Secondary | ICD-10-CM | POA: Diagnosis not present

## 2012-09-13 DIAGNOSIS — D649 Anemia, unspecified: Secondary | ICD-10-CM | POA: Diagnosis not present

## 2012-09-13 DIAGNOSIS — F0390 Unspecified dementia without behavioral disturbance: Secondary | ICD-10-CM | POA: Diagnosis not present

## 2012-09-13 DIAGNOSIS — I10 Essential (primary) hypertension: Secondary | ICD-10-CM | POA: Diagnosis not present

## 2012-09-14 DIAGNOSIS — M6281 Muscle weakness (generalized): Secondary | ICD-10-CM | POA: Diagnosis not present

## 2012-09-14 DIAGNOSIS — I10 Essential (primary) hypertension: Secondary | ICD-10-CM | POA: Diagnosis not present

## 2012-09-14 DIAGNOSIS — F0390 Unspecified dementia without behavioral disturbance: Secondary | ICD-10-CM | POA: Diagnosis not present

## 2012-09-14 DIAGNOSIS — D649 Anemia, unspecified: Secondary | ICD-10-CM | POA: Diagnosis not present

## 2012-09-14 DIAGNOSIS — S72009D Fracture of unspecified part of neck of unspecified femur, subsequent encounter for closed fracture with routine healing: Secondary | ICD-10-CM | POA: Diagnosis not present

## 2012-09-21 DIAGNOSIS — R269 Unspecified abnormalities of gait and mobility: Secondary | ICD-10-CM | POA: Diagnosis not present

## 2012-09-21 DIAGNOSIS — S72009D Fracture of unspecified part of neck of unspecified femur, subsequent encounter for closed fracture with routine healing: Secondary | ICD-10-CM | POA: Diagnosis not present

## 2012-09-21 DIAGNOSIS — M6281 Muscle weakness (generalized): Secondary | ICD-10-CM | POA: Diagnosis not present

## 2012-09-24 DIAGNOSIS — R269 Unspecified abnormalities of gait and mobility: Secondary | ICD-10-CM | POA: Diagnosis not present

## 2012-09-24 DIAGNOSIS — R030 Elevated blood-pressure reading, without diagnosis of hypertension: Secondary | ICD-10-CM | POA: Diagnosis not present

## 2012-09-24 DIAGNOSIS — M6281 Muscle weakness (generalized): Secondary | ICD-10-CM | POA: Diagnosis not present

## 2012-09-24 DIAGNOSIS — S72009D Fracture of unspecified part of neck of unspecified femur, subsequent encounter for closed fracture with routine healing: Secondary | ICD-10-CM | POA: Diagnosis not present

## 2012-10-08 DIAGNOSIS — E039 Hypothyroidism, unspecified: Secondary | ICD-10-CM | POA: Diagnosis not present

## 2012-10-08 DIAGNOSIS — IMO0002 Reserved for concepts with insufficient information to code with codable children: Secondary | ICD-10-CM | POA: Diagnosis not present

## 2012-10-22 DIAGNOSIS — M6281 Muscle weakness (generalized): Secondary | ICD-10-CM | POA: Diagnosis not present

## 2012-10-22 DIAGNOSIS — S72009D Fracture of unspecified part of neck of unspecified femur, subsequent encounter for closed fracture with routine healing: Secondary | ICD-10-CM | POA: Diagnosis not present

## 2012-10-22 DIAGNOSIS — R269 Unspecified abnormalities of gait and mobility: Secondary | ICD-10-CM | POA: Diagnosis not present

## 2012-11-01 DIAGNOSIS — H52209 Unspecified astigmatism, unspecified eye: Secondary | ICD-10-CM | POA: Diagnosis not present

## 2012-11-20 DIAGNOSIS — IMO0002 Reserved for concepts with insufficient information to code with codable children: Secondary | ICD-10-CM | POA: Diagnosis not present

## 2012-11-20 DIAGNOSIS — S72009D Fracture of unspecified part of neck of unspecified femur, subsequent encounter for closed fracture with routine healing: Secondary | ICD-10-CM | POA: Diagnosis not present

## 2012-11-20 DIAGNOSIS — R269 Unspecified abnormalities of gait and mobility: Secondary | ICD-10-CM | POA: Diagnosis not present

## 2012-11-20 DIAGNOSIS — E039 Hypothyroidism, unspecified: Secondary | ICD-10-CM | POA: Diagnosis not present

## 2012-11-20 DIAGNOSIS — M6281 Muscle weakness (generalized): Secondary | ICD-10-CM | POA: Diagnosis not present

## 2012-12-21 DIAGNOSIS — R269 Unspecified abnormalities of gait and mobility: Secondary | ICD-10-CM | POA: Diagnosis not present

## 2012-12-21 DIAGNOSIS — M6281 Muscle weakness (generalized): Secondary | ICD-10-CM | POA: Diagnosis not present

## 2012-12-21 DIAGNOSIS — S72009D Fracture of unspecified part of neck of unspecified femur, subsequent encounter for closed fracture with routine healing: Secondary | ICD-10-CM | POA: Diagnosis not present

## 2012-12-31 DIAGNOSIS — IMO0002 Reserved for concepts with insufficient information to code with codable children: Secondary | ICD-10-CM | POA: Diagnosis not present

## 2012-12-31 DIAGNOSIS — E039 Hypothyroidism, unspecified: Secondary | ICD-10-CM | POA: Diagnosis not present

## 2013-02-15 DIAGNOSIS — E039 Hypothyroidism, unspecified: Secondary | ICD-10-CM | POA: Diagnosis not present

## 2013-03-12 ENCOUNTER — Ambulatory Visit (INDEPENDENT_AMBULATORY_CARE_PROVIDER_SITE_OTHER): Payer: Medicare Other | Admitting: Endocrinology

## 2013-03-12 ENCOUNTER — Encounter: Payer: Self-pay | Admitting: Endocrinology

## 2013-03-12 VITALS — BP 116/74 | HR 56 | Temp 98.6°F | Resp 12 | Ht 59.75 in | Wt 115.5 lb

## 2013-03-12 DIAGNOSIS — I10 Essential (primary) hypertension: Secondary | ICD-10-CM

## 2013-03-12 DIAGNOSIS — E039 Hypothyroidism, unspecified: Secondary | ICD-10-CM | POA: Diagnosis not present

## 2013-03-12 NOTE — Progress Notes (Signed)
Patient ID: [name], female   DOB: 02/16/1920, 79 y.o.   MRN: [id number]  Reason for Appointment:  Hypothyroidism, new visit    History of Present Illness:   The Hyothyroidism was first diagnosed in 1948 when she had thyroidectomy for Graves' disease   The symptoms consistent with hypothyroidism are:  feeling sleepy during the day despite sleeping well at night and needing naps. She also has had some dry skin but no significant weight gain or cold intolerance . She has had some hair loss which is not new   The last TSH was performed  on 02/15/13 and the result was 21.34 Previous TSH levels were performed: On 12/31/12 with the result 20.6 and prior to that on 11/20/12 with the result 23.4 Total T4 low at 4.7 in 5/14 and total T3 also low at 49  The treatments that the patient has taken include Synthroid in increasing doses.  Her last dosage change was on 01/01/13 when she was given 125 mcg, 2 tablets         The response to therapy has been inadequate with continued fatigue and sleepiness and persistently high PSAs despite increasing her doses  Past Medical History  Diagnosis Date  . Thyroid disease   . Hypertension   . Dementia   . Anemia   . Gastric ulcer     gi bleed secondary  . Renal disorder   . Vertigo     Past Surgical History  Procedure Laterality Date  . Thyroid surgery    . Mastectomy    . Carpal tunnel release    . Abdominal hysterectomy    . Femur im nail  05/13/2012    Procedure: INTRAMEDULLARY (IM) NAIL FEMORAL;  Surgeon: Senaida Lange, MD;  Location: [facility] OR;  Service: Orthopedics;  Laterality: Right;    No family history on file. Social History:  reports that she has never smoked. She does not have any smokeless tobacco history on file. She reports that she does not drink alcohol or use illicit drugs.  Allergies:  Allergies  Allergen Reactions  . Amoxicillin-Pot Clavulanate     REACTION: UNKNOWN REACTION  . Cephalexin     REACTION: UNKNOWN REACTION      Medication List       This list is accurate as of: 03/12/13 12:14 PM.  Always use your most recent med list.               acetaminophen 500 MG tablet  Commonly known as:  TYLENOL  Take 1,000 mg by mouth at bedtime.     ALPRAZolam 0.25 MG tablet  Commonly known as:  XANAX  Take 1 tablet (0.25 mg total) by mouth 2 (two) times daily.     Biotin 300 MCG Tabs  Take 600 mcg by mouth daily.     calcium-vitamin D 500-200 MG-UNIT per tablet  Commonly known as:  OSCAL WITH D  Take 1 tablet by mouth daily.     ciprofloxacin 500 MG tablet  Commonly known as:  CIPRO  Take 1 tablet (500 mg total) by mouth every 12 (twelve) hours.     DAILY VITE Tabs  Take 1 tablet by mouth daily.     donepezil 10 MG tablet  Commonly known as:  ARICEPT  Take 10 mg by mouth daily.     escitalopram 10 MG tablet  Commonly known as:  LEXAPRO  Take 10 mg by mouth daily.     feeding supplement Liqd  Take 237  mLs by mouth as needed (for nutritional supplementation).     ferrous sulfate 325 (65 FE) MG tablet  Take 1 tablet (325 mg total) by mouth 3 (three) times daily with meals.     levothyroxine 125 MCG tablet  Commonly known as:  SYNTHROID, LEVOTHROID  Take 125 mcg by mouth daily.     losartan 50 MG tablet  Commonly known as:  COZAAR  Take 50 mg by mouth daily.     Lutein 6 MG Caps  Take 1 capsule by mouth daily.     magnesium hydroxide 400 MG/5ML suspension  Commonly known as:  MILK OF MAGNESIA  Take 30 mLs by mouth daily as needed. Constipation.     meclizine 25 MG tablet  Commonly known as:  ANTIVERT  Take 1 tablet (25 mg total) by mouth 3 (three) times daily as needed.     memantine 10 MG tablet  Commonly known as:  NAMENDA  Take 10 mg by mouth 2 (two) times daily.     metaxalone 800 MG tablet  Commonly known as:  SKELAXIN  Take 800 mg by mouth 3 (three) times daily as needed. Muscle spasms.     pantoprazole 40 MG tablet  Commonly known as:  PROTONIX  Take 40 mg by mouth  daily at 12 noon.     senna 8.6 MG Tabs  Commonly known as:  SENOKOT  Take 1 tablet (8.6 mg total) by mouth 2 (two) times daily.     traMADol 50 MG tablet  Commonly known as:  ULTRAM  Take 50 mg by mouth every 6 (six) hours as needed. Pain.        Review of Systems:  CARDIOLOGY:  has  history of high blood pressure treated with losartan.            GASTROENTEROLOGY:  no Change in bowel habits.positive history of GI reflux       ENDOCRINOLOGY:  no history of Diabetes.    Patient has long-standing history of dementia Has positive history of depression treated with Celexa   History of joint pains History of fine deficiency anemia        Examination:    BP 116/74  Pulse 56  Temp(Src) 98.6 F (37 C)  Resp 12  Ht 4' 11.75" (1.518 m)  Wt 115 lb 8 oz (52.39 kg)  BMI 22.74 kg/m2  SpO2 95%   General Appearance: pleasant, not pale or lethargic, conversing pleasantly          Eyes: No proptosis or eyelid swelling .          Neck: The thyroid is nonpalpable. There is no lymphadenopathy .    Cardiovascular: Normal apex and heart sounds, no murmur Respiratory:  Lungs clear Neurological: REFLEXES: at biceps are normal.     Skin: moist, warm, no rashes       Assessments . Postsurgical Hypothyroidism  She appears to be symptomatic from her persistent hypothyroidism with fatigue and somnolence However she does not appear to have overt myxedema Not clear how long she has been having persistent hypothyroidism with TSH levels over 20 Currently she is taking relatively large dose of 250 mcg despite her weight being only 115 pounds Also not clear how often she has had dosage changes from PCP. Apparently he is taking the same dose since 5/14 with no change in TSH Most likely her persistent hypothyroidism is related to drug interaction with taking her Synthroid along with calcium and iron in the mornings Her  thyroid levels may come back to normal with delaying her calcium and iron intake to  after lunch  Somnolence: May be related to hypothyroidism but also may be related to her taking Xanax every night especially with slow metabolism related to hypothyroidism  Treatment:    Stopped taking calcium and iron with thyroid supplement, given instructions for nursing home for medication administration If her TSH is still high will increase her dose on her next visit Will need followup in 2 months  Talor Cheema 03/12/2013, 12:14 PM

## 2013-03-12 NOTE — Patient Instructions (Addendum)
Synthroid dose to be decided once lab work is available  Did not take any iron or calcium in the morning and may take these at lunchtime

## 2013-03-14 ENCOUNTER — Encounter: Payer: Self-pay | Admitting: Endocrinology

## 2013-05-02 DIAGNOSIS — Z23 Encounter for immunization: Secondary | ICD-10-CM | POA: Diagnosis not present

## 2013-05-09 DIAGNOSIS — H353290 Exudative age-related macular degeneration, unspecified eye, stage unspecified: Secondary | ICD-10-CM | POA: Diagnosis not present

## 2013-05-09 DIAGNOSIS — H353190 Nonexudative age-related macular degeneration, unspecified eye, stage unspecified: Secondary | ICD-10-CM | POA: Diagnosis not present

## 2013-05-09 DIAGNOSIS — H43819 Vitreous degeneration, unspecified eye: Secondary | ICD-10-CM | POA: Diagnosis not present

## 2013-05-13 ENCOUNTER — Ambulatory Visit: Payer: Medicare Other | Admitting: Endocrinology

## 2013-05-15 ENCOUNTER — Encounter: Payer: Self-pay | Admitting: Endocrinology

## 2013-05-15 ENCOUNTER — Ambulatory Visit (INDEPENDENT_AMBULATORY_CARE_PROVIDER_SITE_OTHER): Payer: Medicare Other | Admitting: Endocrinology

## 2013-05-15 VITALS — BP 102/60 | HR 60 | Temp 98.2°F | Resp 12 | Ht 61.0 in | Wt 113.5 lb

## 2013-05-15 DIAGNOSIS — E039 Hypothyroidism, unspecified: Secondary | ICD-10-CM | POA: Diagnosis not present

## 2013-05-15 LAB — T4, FREE: Free T4: 0.54 ng/dL — ABNORMAL LOW (ref 0.60–1.60)

## 2013-05-15 LAB — TSH: TSH: 7.47 u[IU]/mL — ABNORMAL HIGH (ref 0.35–5.50)

## 2013-05-15 NOTE — Progress Notes (Signed)
Patient ID: [name], female   DOB: 1920-03-09, 79 y.o.   MRN: [id number]  Reason for Appointment:  Hypothyroidism, f/u visit    History of Present Illness:   The Hyothyroidism was first diagnosed in 1948 when she had thyroidectomy for Graves' disease   The symptoms consistent with hypothyroidism  previously were: feeling sleepy during the day despite sleeping well at night and needing naps. She also has had some dry skin but no significant weight gain or cold intolerance . She has had some hair loss which is not new Since her last visit she feels less sleepy and also has not had her vertigo symptoms. Her son does not know if she is more alert than before  The last TSH was performed  on 02/15/13 and the result was 21.34 Previous TSH levels were performed: On 12/31/12 with the result 20.6 and prior to that on 11/20/12 with the result 23.4 Total T4 low at 4.7 in 5/14 and total T3 also low at 49  The treatments that the patient has taken include Synthroid in increasing doses.  Her last dosage change was on 01/01/13 when she was given 125 mcg, 2 tablets     Since she was getting iron and calcium tablets in the morning with her Synthroid timing of these was changed on her last visit and she is taking these at lunch. This was verified on her [facility] today       Past Medical History  Diagnosis Date  . Thyroid disease   . Hypertension   . Dementia   . Anemia   . Gastric ulcer     gi bleed secondary  . Renal disorder   . Vertigo     Past Surgical History  Procedure Laterality Date  . Thyroid surgery    . Mastectomy    . Carpal tunnel release    . Abdominal hysterectomy    . Femur im nail  05/13/2012    Procedure: INTRAMEDULLARY (IM) NAIL FEMORAL;  Surgeon: Senaida Lange, MD;  Location: [facility] OR;  Service: Orthopedics;  Laterality: Right;    Family History  Problem Relation Age of Onset  . Thyroid disease Neg Hx    Social History:  reports that she has never smoked. She does not have any  smokeless tobacco history on file. She reports that she does not drink alcohol or use illicit drugs.  Allergies:  Allergies  Allergen Reactions  . Amoxicillin-Pot Clavulanate     REACTION: UNKNOWN REACTION  . Cephalexin     REACTION: UNKNOWN REACTION      Medication List       This list is accurate as of: 05/15/13 11:09 AM.  Always use your most recent med list.               acetaminophen 500 MG tablet  Commonly known as:  TYLENOL  Take 1,000 mg by mouth at bedtime.     ALPRAZolam 0.25 MG tablet  Commonly known as:  XANAX  Take 1 tablet (0.25 mg total) by mouth 2 (two) times daily.     Biotin 300 MCG Tabs  Take 600 mcg by mouth daily.     calcium-vitamin D 500-200 MG-UNIT per tablet  Commonly known as:  OSCAL WITH D  Take 1 tablet by mouth daily.     DAILY VITE Tabs  Take 1 tablet by mouth daily.     donepezil 10 MG tablet  Commonly known as:  ARICEPT  Take 10 mg by mouth  daily.     escitalopram 10 MG tablet  Commonly known as:  LEXAPRO  Take 10 mg by mouth daily.     feeding supplement Liqd  Take 237 mLs by mouth as needed (for nutritional supplementation).     ferrous sulfate 325 (65 FE) MG tablet  Take 1 tablet (325 mg total) by mouth 3 (three) times daily with meals.     levothyroxine 125 MCG tablet  Commonly known as:  SYNTHROID, LEVOTHROID  Take 125 mcg by mouth daily.     losartan 50 MG tablet  Commonly known as:  COZAAR  Take 50 mg by mouth daily.     Lutein 6 MG Caps  Take 1 capsule by mouth daily.     magnesium hydroxide 400 MG/5ML suspension  Commonly known as:  MILK OF MAGNESIA  Take 30 mLs by mouth daily as needed. Constipation.     memantine 10 MG tablet  Commonly known as:  NAMENDA  Take 10 mg by mouth 2 (two) times daily.     pantoprazole 40 MG tablet  Commonly known as:  PROTONIX  Take 40 mg by mouth daily at 12 noon.     senna 8.6 MG Tabs tablet  Commonly known as:  SENOKOT  Take 1 tablet (8.6 mg total) by mouth 2 (two)  times daily.        Review of Systems:            Patient has long-standing history of dementia Has positive history of depression treated with Celexa   History of Iron deficiency anemia        Examination:    BP 102/60  Pulse 60  Temp(Src) 98.2 F (36.8 C)  Resp 12  Ht 5\' 1"  (1.549 m)  Wt 113 lb 8 oz (51.483 kg)  BMI 21.46 kg/m2  SpO2 94%   General Appearance: pleasant, alert, conversing pleasantly          Eyes:  normal appearance         Neck: The thyroid is nonpalpable.  Neurological: REFLEXES: at biceps are normal.         Assessments .   Postsurgical Hypothyroidism, long-standing  She appears to be symptomatically better with changing her Synthroid to avoid interaction with her calcium and iron She does look more alert today and her exam is fairly good Will check her TSH today and decide on further treatment   [name] 05/15/2013, 11:09 AM   Addendum: Labs shows TSH 7.5 She will continue the same prescription but add additional half tablet twice a week

## 2013-05-16 ENCOUNTER — Other Ambulatory Visit: Payer: Self-pay | Admitting: *Deleted

## 2013-06-18 DIAGNOSIS — J029 Acute pharyngitis, unspecified: Secondary | ICD-10-CM | POA: Diagnosis not present

## 2013-06-18 DIAGNOSIS — J209 Acute bronchitis, unspecified: Secondary | ICD-10-CM | POA: Diagnosis not present

## 2013-06-18 DIAGNOSIS — IMO0002 Reserved for concepts with insufficient information to code with codable children: Secondary | ICD-10-CM | POA: Diagnosis not present

## 2013-07-30 DIAGNOSIS — E039 Hypothyroidism, unspecified: Secondary | ICD-10-CM | POA: Diagnosis not present

## 2013-07-30 DIAGNOSIS — E568 Deficiency of other vitamins: Secondary | ICD-10-CM | POA: Diagnosis not present

## 2013-07-30 DIAGNOSIS — IMO0002 Reserved for concepts with insufficient information to code with codable children: Secondary | ICD-10-CM | POA: Diagnosis not present

## 2013-07-30 DIAGNOSIS — I10 Essential (primary) hypertension: Secondary | ICD-10-CM | POA: Diagnosis not present

## 2013-07-30 DIAGNOSIS — E559 Vitamin D deficiency, unspecified: Secondary | ICD-10-CM | POA: Diagnosis not present

## 2013-08-08 ENCOUNTER — Ambulatory Visit (INDEPENDENT_AMBULATORY_CARE_PROVIDER_SITE_OTHER): Payer: Medicare Other | Admitting: Endocrinology

## 2013-08-08 ENCOUNTER — Encounter: Payer: Self-pay | Admitting: Endocrinology

## 2013-08-08 VITALS — BP 134/76 | HR 77 | Temp 98.2°F | Resp 12 | Ht 61.0 in | Wt 113.3 lb

## 2013-08-08 DIAGNOSIS — E039 Hypothyroidism, unspecified: Secondary | ICD-10-CM

## 2013-08-08 NOTE — Progress Notes (Signed)
Patient ID: [name], female   DOB: [birth year], 79 y.o.   MRN: [id number]   Reason for Appointment:  Hypothyroidism, f/u visit   History of Present Illness:   The Hyothyroidism was first diagnosed in 1948 when she had thyroidectomy for Graves' disease   The symptoms consistent with hypothyroidism  previously were: feeling sleepy during the day despite sleeping well at night and needing naps. She also has had some dry skin but no significant weight gain or cold intolerance . She has had some hair loss which is not new Since her last visit she feels less sleepy and also has not had her vertigo symptoms.   The treatments that the patient has taken include Synthroid in increasing doses.  Since she was getting iron and calcium tablets in the morning with her Synthroid timing of these was changed on visit in 7/14. She is taking her Synthroid at 7 AM and her first one tablet at 8 AM. She is taking her calcium supplement at lunch. This was verified on her MAR today In 9/14 because of her TSH being still high she was given additional half tablet twice a day of her Synthroid 125 mcg and she is also taking 2 tablets daily otherwise  TSH done on 07/30/13 is 2.4 Previous TSH levels: on 02/15/13;  result was 21.34  Lab Results  Component Value Date   TSH 7.47* 05/15/2013   TSH 1.782 05/22/2012   FREET4 0.54* 05/15/2013   FREET4 0.92 05/22/2012          Past Medical History  Diagnosis Date  . Thyroid disease   . Hypertension   . Dementia   . Anemia   . Gastric ulcer     gi bleed secondary  . Renal disorder   . Vertigo     Past Surgical History  Procedure Laterality Date  . Thyroid surgery    . Mastectomy    . Carpal tunnel release    . Abdominal hysterectomy    . Femur im nail  05/13/2012    Procedure: INTRAMEDULLARY (IM) NAIL FEMORAL;  Surgeon: Senaida Lange, MD;  Location: [facility] OR;  Service: Orthopedics;  Laterality: Right;    Family History  Problem Relation Age of Onset  .  Thyroid disease Neg Hx    Social History:  reports that she has never smoked. She does not have any smokeless tobacco history on file. She reports that she does not drink alcohol or use illicit drugs.  Allergies:  Allergies  Allergen Reactions  . Amoxicillin-Pot Clavulanate     REACTION: UNKNOWN REACTION  . Cephalexin     REACTION: UNKNOWN REACTION      Medication List       This list is accurate as of: 08/08/13 11:59 PM.  Always use your most recent med list.               acetaminophen 500 MG tablet  Commonly known as:  TYLENOL  Take 1,000 mg by mouth at bedtime.     ALPRAZolam 0.25 MG tablet  Commonly known as:  XANAX  Take 1 tablet (0.25 mg total) by mouth 2 (two) times daily.     Biotin 300 MCG Tabs  Take 600 mcg by mouth daily.     calcium-vitamin D 500-200 MG-UNIT per tablet  Commonly known as:  OSCAL WITH D  Take 1 tablet by mouth daily.     DAILY VITE Tabs  Take 1 tablet by mouth daily.  donepezil 10 MG tablet  Commonly known as:  ARICEPT  Take 10 mg by mouth daily.     escitalopram 10 MG tablet  Commonly known as:  LEXAPRO  Take 10 mg by mouth daily.     feeding supplement (ENSURE COMPLETE) Liqd  Take 237 mLs by mouth as needed (for nutritional supplementation).     ferrous sulfate 325 (65 FE) MG tablet  Take 1 tablet (325 mg total) by mouth 3 (three) times daily with meals.     levothyroxine 125 MCG tablet  Commonly known as:  SYNTHROID, LEVOTHROID  Take 125 mcg by mouth daily. Take 2 tablets daily with an extra half day on Tuesday and Friday     loratadine 10 MG tablet  Commonly known as:  CLARITIN  Take 10 mg by mouth daily.     losartan 50 MG tablet  Commonly known as:  COZAAR  Take 50 mg by mouth daily.     Lutein 6 MG Caps  Take 1 capsule by mouth daily.     magnesium hydroxide 400 MG/5ML suspension  Commonly known as:  MILK OF MAGNESIA  Take 30 mLs by mouth daily as needed. Constipation.     meclizine 25 MG tablet   Commonly known as:  ANTIVERT  Take 25 mg by mouth 3 (three) times daily as needed for dizziness.     memantine 10 MG tablet  Commonly known as:  NAMENDA  Take 10 mg by mouth 2 (two) times daily.     pantoprazole 40 MG tablet  Commonly known as:  PROTONIX  Take 40 mg by mouth daily at 12 noon.     senna 8.6 MG Tabs tablet  Commonly known as:  SENOKOT  Take 1 tablet (8.6 mg total) by mouth 2 (two) times daily.        Review of Systems:            Patient has long-standing history of dementia Has positive history of depression treated with Celexa   History of Iron deficiency anemia        Examination:    BP 134/76  Pulse 77  Temp(Src) 98.2 F (36.8 C)  Resp 12  Ht 5\' 1"  (1.549 m)  Wt 113 lb 4.8 oz (51.393 kg)  BMI 21.42 kg/m2  SpO2 94%   General Appearance: pleasant, alert, conversing pleasantly          Eyes:  normal appearance         Neck: The thyroid is nonpalpable.  Neurological: REFLEXES: at biceps are normal.         Assessments .   Postsurgical Hypothyroidism, long-standing  She has been symptomatically better with changes in her Synthroid supplement and improving TSH She seemed to have benefited significantly with changing her Synthroid and supplement administration times to avoid interaction with her calcium and iron She does look quite alert today  We'll continue her on the same regimen and followup in 6 months  Jaira Canady 08/09/2013, 12:56 PM

## 2013-08-08 NOTE — Patient Instructions (Signed)
Same thyroid regimen

## 2013-11-04 DIAGNOSIS — H52209 Unspecified astigmatism, unspecified eye: Secondary | ICD-10-CM | POA: Diagnosis not present

## 2013-11-04 DIAGNOSIS — Z961 Presence of intraocular lens: Secondary | ICD-10-CM | POA: Diagnosis not present

## 2013-11-04 DIAGNOSIS — H31019 Macula scars of posterior pole (postinflammatory) (post-traumatic), unspecified eye: Secondary | ICD-10-CM | POA: Diagnosis not present

## 2013-11-04 DIAGNOSIS — H3530 Unspecified macular degeneration: Secondary | ICD-10-CM | POA: Diagnosis not present

## 2013-11-14 DIAGNOSIS — H3560 Retinal hemorrhage, unspecified eye: Secondary | ICD-10-CM | POA: Diagnosis not present

## 2013-11-14 DIAGNOSIS — H01009 Unspecified blepharitis unspecified eye, unspecified eyelid: Secondary | ICD-10-CM | POA: Diagnosis not present

## 2013-11-14 DIAGNOSIS — H3530 Unspecified macular degeneration: Secondary | ICD-10-CM | POA: Diagnosis not present

## 2013-11-21 DIAGNOSIS — H353290 Exudative age-related macular degeneration, unspecified eye, stage unspecified: Secondary | ICD-10-CM | POA: Diagnosis not present

## 2013-11-21 DIAGNOSIS — H353190 Nonexudative age-related macular degeneration, unspecified eye, stage unspecified: Secondary | ICD-10-CM | POA: Diagnosis not present

## 2013-12-26 DIAGNOSIS — IMO0002 Reserved for concepts with insufficient information to code with codable children: Secondary | ICD-10-CM | POA: Diagnosis not present

## 2013-12-26 DIAGNOSIS — L570 Actinic keratosis: Secondary | ICD-10-CM | POA: Diagnosis not present

## 2013-12-26 DIAGNOSIS — J309 Allergic rhinitis, unspecified: Secondary | ICD-10-CM | POA: Diagnosis not present

## 2013-12-26 DIAGNOSIS — R3919 Other difficulties with micturition: Secondary | ICD-10-CM | POA: Diagnosis not present

## 2014-01-09 ENCOUNTER — Emergency Department (HOSPITAL_COMMUNITY)
Admission: EM | Admit: 2014-01-09 | Discharge: 2014-01-09 | Disposition: A | Discharge status: Home / Self Care | Payer: Medicare Other | Attending: Emergency Medicine | Admitting: Emergency Medicine

## 2014-01-09 ENCOUNTER — Encounter (HOSPITAL_COMMUNITY): Payer: Self-pay | Admitting: Emergency Medicine

## 2014-01-09 DIAGNOSIS — R4182 Altered mental status, unspecified: Secondary | ICD-10-CM | POA: Diagnosis not present

## 2014-01-09 DIAGNOSIS — F0390 Unspecified dementia without behavioral disturbance: Secondary | ICD-10-CM | POA: Insufficient documentation

## 2014-01-09 DIAGNOSIS — I10 Essential (primary) hypertension: Secondary | ICD-10-CM | POA: Diagnosis not present

## 2014-01-09 NOTE — ED Notes (Signed)
Pt' son is upset about the wait.  Caryl AspSayss he was called  For by the lab and then told that she was the  Wrong one. Then the lab tech went out into the lobby and called the same name again,  This per son.  Pt walked out with walker , with son.

## 2014-01-09 NOTE — ED Notes (Addendum)
[place] is [place] [facility], walks with walker, alert, talking.   Pts son brought her .  ?confusion , or ams over last month.  ? Memory problems  Knows where she is, the month and her age.

## 2014-02-04 DIAGNOSIS — H353290 Exudative age-related macular degeneration, unspecified eye, stage unspecified: Secondary | ICD-10-CM | POA: Diagnosis not present

## 2014-02-04 DIAGNOSIS — H353190 Nonexudative age-related macular degeneration, unspecified eye, stage unspecified: Secondary | ICD-10-CM | POA: Diagnosis not present

## 2014-02-06 ENCOUNTER — Ambulatory Visit (INDEPENDENT_AMBULATORY_CARE_PROVIDER_SITE_OTHER): Payer: Medicare Other | Admitting: Endocrinology

## 2014-02-06 ENCOUNTER — Other Ambulatory Visit (INDEPENDENT_AMBULATORY_CARE_PROVIDER_SITE_OTHER): Payer: Medicare Other

## 2014-02-06 ENCOUNTER — Encounter: Payer: Self-pay | Admitting: Endocrinology

## 2014-02-06 VITALS — BP 110/60 | HR 80 | Temp 97.7°F | Resp 14 | Ht 61.0 in | Wt 111.4 lb

## 2014-02-06 DIAGNOSIS — E039 Hypothyroidism, unspecified: Secondary | ICD-10-CM | POA: Diagnosis not present

## 2014-02-06 LAB — T4, FREE: FREE T4: 1.45 ng/dL (ref 0.60–1.60)

## 2014-02-06 LAB — TSH: TSH: 0.06 u[IU]/mL — ABNORMAL LOW (ref 0.35–4.50)

## 2014-02-06 NOTE — Progress Notes (Signed)
Quick Note:  Thyroid level is high, will need to fax instructions to change Synthroid: She will not take the extra half tablet 3 times a week ______

## 2014-02-06 NOTE — Progress Notes (Signed)
Patient ID: [name], female   DOB: [birth year], 80 y.o.   MRN: [id number]   Reason for Appointment:  Hypothyroidism, f/u visit   History of Present Illness:   The Hyothyroidism was first diagnosed in 1948 when she had thyroidectomy for Graves' disease   The symptoms consistent with hypothyroidism  previously were: feeling sleepy during the day despite sleeping well at night and needing naps. She also has had some dry skin but no significant weight gain or cold intolerance. She has had some hair loss which is not new  The treatments that the patient has taken include Synthroid in increasing doses.  Since she was getting iron and calcium tablets in the morning with her Synthroid timing of these was changed on visit in 7/14. She is taking her Synthroid at 7 AM and her first one tablet at 8 AM. She is taking her calcium supplement at lunch and iron at 8 pm This was verified on her MAR today In 9/14 because of her TSH being still high she was given additional half tablet twice a week of her Synthroid 125 mcg and she is also taking 2 tablets daily otherwise Her dose was continued unchanged in 12/14  She continues to feel fairly well although difficult to get a good history because of her dementia. She is reportedly not getting excessive sleepiness or fatigue  Wt Readings from Last 3 Encounters:  02/06/14 111 lb 6.4 oz (50.531 kg)  08/08/13 113 lb 4.8 oz (51.393 kg)  05/15/13 113 lb 8 oz (51.483 kg)   TSH from today is pending TSH done on 07/30/13 is 2.4 Previous TSH levels: on 02/15/13;  result was 21.34  Lab Results  Component Value Date   TSH 7.47* 05/15/2013   TSH 1.782 05/22/2012   FREET4 0.54* 05/15/2013   FREET4 0.92 05/22/2012          Past Medical History  Diagnosis Date  . Thyroid disease   . Hypertension   . Dementia   . Anemia   . Gastric ulcer     gi bleed secondary  . Renal disorder   . Vertigo     Past Surgical History  Procedure Laterality Date  . Thyroid  surgery    . Mastectomy    . Carpal tunnel release    . Abdominal hysterectomy    . Femur im nail  05/13/2012    Procedure: INTRAMEDULLARY (IM) NAIL FEMORAL;  Surgeon: Senaida LangeKevin M Supple, MD;  Location: [facility] OR;  Service: Orthopedics;  Laterality: Right;  . Fracture surgery      Family History  Problem Relation Age of Onset  . Thyroid disease Neg Hx    Social History:  reports that she has never smoked. She does not have any smokeless tobacco history on file. She reports that she does not drink alcohol or use illicit drugs.  Allergies:  Allergies  Allergen Reactions  . Amoxicillin-Pot Clavulanate     REACTION: UNKNOWN REACTION  . Cephalexin     REACTION: UNKNOWN REACTION  . Nsaids       Medication List       This list is accurate as of: 02/06/14 10:22 AM.  Always use your most recent med list.               acetaminophen 500 MG tablet  Commonly known as:  TYLENOL  Take 1,000 mg by mouth at bedtime.     ALPRAZolam 0.25 MG tablet  Commonly known as:  XANAX  Take 1  tablet (0.25 mg total) by mouth 2 (two) times daily.     Biotin 300 MCG Tabs  Take 600 mcg by mouth daily.     calcium-vitamin D 500-200 MG-UNIT per tablet  Commonly known as:  OSCAL WITH D  Take 1 tablet by mouth daily.     DAILY VITE Tabs  Take 1 tablet by mouth daily.     donepezil 10 MG tablet  Commonly known as:  ARICEPT  Take 10 mg by mouth daily.     escitalopram 10 MG tablet  Commonly known as:  LEXAPRO  Take 10 mg by mouth daily.     feeding supplement (ENSURE COMPLETE) Liqd  Take 237 mLs by mouth as needed (for nutritional supplementation).     ferrous sulfate 325 (65 FE) MG tablet  Take 1 tablet (325 mg total) by mouth 3 (three) times daily with meals.     fluticasone 50 MCG/ACT nasal spray  Commonly known as:  FLONASE     levothyroxine 125 MCG tablet  Commonly known as:  SYNTHROID, LEVOTHROID  Take 125 mcg by mouth daily. Take 2 tablets daily with an extra half day on Tuesday and  Friday     loratadine 10 MG tablet  Commonly known as:  CLARITIN  Take 10 mg by mouth daily.     losartan 50 MG tablet  Commonly known as:  COZAAR  Take 50 mg by mouth daily.     Lutein 6 MG Caps  Take 1 capsule by mouth daily.     magnesium hydroxide 400 MG/5ML suspension  Commonly known as:  MILK OF MAGNESIA  Take 30 mLs by mouth daily as needed. Constipation.     meclizine 25 MG tablet  Commonly known as:  ANTIVERT  Take 25 mg by mouth 3 (three) times daily as needed for dizziness.     memantine 10 MG tablet  Commonly known as:  NAMENDA  Take 10 mg by mouth 2 (two) times daily.     pantoprazole 40 MG tablet  Commonly known as:  PROTONIX  Take 40 mg by mouth daily at 12 noon.     senna 8.6 MG Tabs tablet  Commonly known as:  SENOKOT  Take 1 tablet (8.6 mg total) by mouth 2 (two) times daily.        Review of Systems:            Patient has long-standing history of dementia Has positive history of depression treated with Celexa   History of Iron deficiency anemia        Examination:    BP 110/60  Pulse 80  Temp(Src) 97.7 F (36.5 C)  Resp 14  Ht 5\' 1"  (1.549 m)  Wt 111 lb 6.4 oz (50.531 kg)  BMI 21.06 kg/m2  SpO2 93%   General Appearance: pleasant, alert, conversing pleasantly          Eyes:  normal appearance         Neck: The thyroid is nonpalpable.  Neurological: REFLEXES: at biceps are normal.         Assessments .   Postsurgical Hypothyroidism, long-standing  She has been symptomatically better with changes in her Synthroid supplement in 2014 Her last TSH was normal  She also benefited significantly with changing her Synthroid and supplement administration times to avoid interaction with her calcium and iron She does look quite alert today  To decide dosage of Synthroid based on TSH level today, followup in 6 months unless doses changed  KUMAR,AJAY 02/06/2014, 10:22 AM

## 2014-02-25 DIAGNOSIS — M159 Polyosteoarthritis, unspecified: Secondary | ICD-10-CM | POA: Diagnosis not present

## 2014-02-25 DIAGNOSIS — R279 Unspecified lack of coordination: Secondary | ICD-10-CM | POA: Diagnosis not present

## 2014-02-25 DIAGNOSIS — S72009D Fracture of unspecified part of neck of unspecified femur, subsequent encounter for closed fracture with routine healing: Secondary | ICD-10-CM | POA: Diagnosis not present

## 2014-02-25 DIAGNOSIS — M6281 Muscle weakness (generalized): Secondary | ICD-10-CM | POA: Diagnosis not present

## 2014-02-27 DIAGNOSIS — M6281 Muscle weakness (generalized): Secondary | ICD-10-CM | POA: Diagnosis not present

## 2014-02-27 DIAGNOSIS — S72009D Fracture of unspecified part of neck of unspecified femur, subsequent encounter for closed fracture with routine healing: Secondary | ICD-10-CM | POA: Diagnosis not present

## 2014-02-27 DIAGNOSIS — M159 Polyosteoarthritis, unspecified: Secondary | ICD-10-CM | POA: Diagnosis not present

## 2014-02-27 DIAGNOSIS — R279 Unspecified lack of coordination: Secondary | ICD-10-CM | POA: Diagnosis not present

## 2014-02-28 DIAGNOSIS — R279 Unspecified lack of coordination: Secondary | ICD-10-CM | POA: Diagnosis not present

## 2014-02-28 DIAGNOSIS — M159 Polyosteoarthritis, unspecified: Secondary | ICD-10-CM | POA: Diagnosis not present

## 2014-02-28 DIAGNOSIS — M6281 Muscle weakness (generalized): Secondary | ICD-10-CM | POA: Diagnosis not present

## 2014-02-28 DIAGNOSIS — S72009D Fracture of unspecified part of neck of unspecified femur, subsequent encounter for closed fracture with routine healing: Secondary | ICD-10-CM | POA: Diagnosis not present

## 2014-03-04 DIAGNOSIS — M159 Polyosteoarthritis, unspecified: Secondary | ICD-10-CM | POA: Diagnosis not present

## 2014-03-04 DIAGNOSIS — S72009D Fracture of unspecified part of neck of unspecified femur, subsequent encounter for closed fracture with routine healing: Secondary | ICD-10-CM | POA: Diagnosis not present

## 2014-03-04 DIAGNOSIS — R279 Unspecified lack of coordination: Secondary | ICD-10-CM | POA: Diagnosis not present

## 2014-03-04 DIAGNOSIS — M6281 Muscle weakness (generalized): Secondary | ICD-10-CM | POA: Diagnosis not present

## 2014-03-05 DIAGNOSIS — R279 Unspecified lack of coordination: Secondary | ICD-10-CM | POA: Diagnosis not present

## 2014-03-05 DIAGNOSIS — M159 Polyosteoarthritis, unspecified: Secondary | ICD-10-CM | POA: Diagnosis not present

## 2014-03-05 DIAGNOSIS — M6281 Muscle weakness (generalized): Secondary | ICD-10-CM | POA: Diagnosis not present

## 2014-03-05 DIAGNOSIS — S72009D Fracture of unspecified part of neck of unspecified femur, subsequent encounter for closed fracture with routine healing: Secondary | ICD-10-CM | POA: Diagnosis not present

## 2014-03-06 DIAGNOSIS — R279 Unspecified lack of coordination: Secondary | ICD-10-CM | POA: Diagnosis not present

## 2014-03-06 DIAGNOSIS — M159 Polyosteoarthritis, unspecified: Secondary | ICD-10-CM | POA: Diagnosis not present

## 2014-03-06 DIAGNOSIS — M6281 Muscle weakness (generalized): Secondary | ICD-10-CM | POA: Diagnosis not present

## 2014-03-06 DIAGNOSIS — S72009D Fracture of unspecified part of neck of unspecified femur, subsequent encounter for closed fracture with routine healing: Secondary | ICD-10-CM | POA: Diagnosis not present

## 2014-03-07 DIAGNOSIS — R279 Unspecified lack of coordination: Secondary | ICD-10-CM | POA: Diagnosis not present

## 2014-03-07 DIAGNOSIS — S72009D Fracture of unspecified part of neck of unspecified femur, subsequent encounter for closed fracture with routine healing: Secondary | ICD-10-CM | POA: Diagnosis not present

## 2014-03-07 DIAGNOSIS — M6281 Muscle weakness (generalized): Secondary | ICD-10-CM | POA: Diagnosis not present

## 2014-03-07 DIAGNOSIS — M159 Polyosteoarthritis, unspecified: Secondary | ICD-10-CM | POA: Diagnosis not present

## 2014-03-11 DIAGNOSIS — H353190 Nonexudative age-related macular degeneration, unspecified eye, stage unspecified: Secondary | ICD-10-CM | POA: Diagnosis not present

## 2014-03-11 DIAGNOSIS — H3560 Retinal hemorrhage, unspecified eye: Secondary | ICD-10-CM | POA: Diagnosis not present

## 2014-03-12 DIAGNOSIS — R279 Unspecified lack of coordination: Secondary | ICD-10-CM | POA: Diagnosis not present

## 2014-03-12 DIAGNOSIS — S72009D Fracture of unspecified part of neck of unspecified femur, subsequent encounter for closed fracture with routine healing: Secondary | ICD-10-CM | POA: Diagnosis not present

## 2014-03-12 DIAGNOSIS — M159 Polyosteoarthritis, unspecified: Secondary | ICD-10-CM | POA: Diagnosis not present

## 2014-03-12 DIAGNOSIS — M6281 Muscle weakness (generalized): Secondary | ICD-10-CM | POA: Diagnosis not present

## 2014-03-13 DIAGNOSIS — M6281 Muscle weakness (generalized): Secondary | ICD-10-CM | POA: Diagnosis not present

## 2014-03-13 DIAGNOSIS — R279 Unspecified lack of coordination: Secondary | ICD-10-CM | POA: Diagnosis not present

## 2014-03-13 DIAGNOSIS — M159 Polyosteoarthritis, unspecified: Secondary | ICD-10-CM | POA: Diagnosis not present

## 2014-03-13 DIAGNOSIS — S72009D Fracture of unspecified part of neck of unspecified femur, subsequent encounter for closed fracture with routine healing: Secondary | ICD-10-CM | POA: Diagnosis not present

## 2014-03-17 DIAGNOSIS — R279 Unspecified lack of coordination: Secondary | ICD-10-CM | POA: Diagnosis not present

## 2014-03-17 DIAGNOSIS — M6281 Muscle weakness (generalized): Secondary | ICD-10-CM | POA: Diagnosis not present

## 2014-03-17 DIAGNOSIS — M159 Polyosteoarthritis, unspecified: Secondary | ICD-10-CM | POA: Diagnosis not present

## 2014-03-17 DIAGNOSIS — S72009D Fracture of unspecified part of neck of unspecified femur, subsequent encounter for closed fracture with routine healing: Secondary | ICD-10-CM | POA: Diagnosis not present

## 2014-03-18 DIAGNOSIS — M6281 Muscle weakness (generalized): Secondary | ICD-10-CM | POA: Diagnosis not present

## 2014-03-18 DIAGNOSIS — R279 Unspecified lack of coordination: Secondary | ICD-10-CM | POA: Diagnosis not present

## 2014-03-18 DIAGNOSIS — M159 Polyosteoarthritis, unspecified: Secondary | ICD-10-CM | POA: Diagnosis not present

## 2014-03-18 DIAGNOSIS — S72009D Fracture of unspecified part of neck of unspecified femur, subsequent encounter for closed fracture with routine healing: Secondary | ICD-10-CM | POA: Diagnosis not present

## 2014-03-24 DIAGNOSIS — M6281 Muscle weakness (generalized): Secondary | ICD-10-CM | POA: Diagnosis not present

## 2014-03-24 DIAGNOSIS — S72009D Fracture of unspecified part of neck of unspecified femur, subsequent encounter for closed fracture with routine healing: Secondary | ICD-10-CM | POA: Diagnosis not present

## 2014-03-24 DIAGNOSIS — M159 Polyosteoarthritis, unspecified: Secondary | ICD-10-CM | POA: Diagnosis not present

## 2014-03-24 DIAGNOSIS — R279 Unspecified lack of coordination: Secondary | ICD-10-CM | POA: Diagnosis not present

## 2014-03-24 DIAGNOSIS — E139 Other specified diabetes mellitus without complications: Secondary | ICD-10-CM | POA: Diagnosis not present

## 2014-04-03 DIAGNOSIS — R279 Unspecified lack of coordination: Secondary | ICD-10-CM | POA: Diagnosis not present

## 2014-04-03 DIAGNOSIS — M6281 Muscle weakness (generalized): Secondary | ICD-10-CM | POA: Diagnosis not present

## 2014-04-03 DIAGNOSIS — S72009D Fracture of unspecified part of neck of unspecified femur, subsequent encounter for closed fracture with routine healing: Secondary | ICD-10-CM | POA: Diagnosis not present

## 2014-04-03 DIAGNOSIS — M159 Polyosteoarthritis, unspecified: Secondary | ICD-10-CM | POA: Diagnosis not present

## 2014-04-07 ENCOUNTER — Other Ambulatory Visit: Payer: Self-pay | Admitting: Endocrinology

## 2014-04-08 DIAGNOSIS — R279 Unspecified lack of coordination: Secondary | ICD-10-CM | POA: Diagnosis not present

## 2014-04-08 DIAGNOSIS — M6281 Muscle weakness (generalized): Secondary | ICD-10-CM | POA: Diagnosis not present

## 2014-04-08 DIAGNOSIS — S72009D Fracture of unspecified part of neck of unspecified femur, subsequent encounter for closed fracture with routine healing: Secondary | ICD-10-CM | POA: Diagnosis not present

## 2014-04-08 DIAGNOSIS — M159 Polyosteoarthritis, unspecified: Secondary | ICD-10-CM | POA: Diagnosis not present

## 2014-04-09 DIAGNOSIS — M159 Polyosteoarthritis, unspecified: Secondary | ICD-10-CM | POA: Diagnosis not present

## 2014-04-09 DIAGNOSIS — S72009D Fracture of unspecified part of neck of unspecified femur, subsequent encounter for closed fracture with routine healing: Secondary | ICD-10-CM | POA: Diagnosis not present

## 2014-04-09 DIAGNOSIS — R279 Unspecified lack of coordination: Secondary | ICD-10-CM | POA: Diagnosis not present

## 2014-04-09 DIAGNOSIS — M6281 Muscle weakness (generalized): Secondary | ICD-10-CM | POA: Diagnosis not present

## 2014-04-14 DIAGNOSIS — M159 Polyosteoarthritis, unspecified: Secondary | ICD-10-CM | POA: Diagnosis not present

## 2014-04-14 DIAGNOSIS — S72009D Fracture of unspecified part of neck of unspecified femur, subsequent encounter for closed fracture with routine healing: Secondary | ICD-10-CM | POA: Diagnosis not present

## 2014-04-14 DIAGNOSIS — M6281 Muscle weakness (generalized): Secondary | ICD-10-CM | POA: Diagnosis not present

## 2014-04-14 DIAGNOSIS — R279 Unspecified lack of coordination: Secondary | ICD-10-CM | POA: Diagnosis not present

## 2014-04-16 DIAGNOSIS — M6281 Muscle weakness (generalized): Secondary | ICD-10-CM | POA: Diagnosis not present

## 2014-04-16 DIAGNOSIS — S72009D Fracture of unspecified part of neck of unspecified femur, subsequent encounter for closed fracture with routine healing: Secondary | ICD-10-CM | POA: Diagnosis not present

## 2014-04-16 DIAGNOSIS — R279 Unspecified lack of coordination: Secondary | ICD-10-CM | POA: Diagnosis not present

## 2014-04-16 DIAGNOSIS — M159 Polyosteoarthritis, unspecified: Secondary | ICD-10-CM | POA: Diagnosis not present

## 2014-04-18 DIAGNOSIS — R279 Unspecified lack of coordination: Secondary | ICD-10-CM | POA: Diagnosis not present

## 2014-04-18 DIAGNOSIS — M6281 Muscle weakness (generalized): Secondary | ICD-10-CM | POA: Diagnosis not present

## 2014-04-18 DIAGNOSIS — M159 Polyosteoarthritis, unspecified: Secondary | ICD-10-CM | POA: Diagnosis not present

## 2014-04-18 DIAGNOSIS — S72009D Fracture of unspecified part of neck of unspecified femur, subsequent encounter for closed fracture with routine healing: Secondary | ICD-10-CM | POA: Diagnosis not present

## 2014-04-22 DIAGNOSIS — M6281 Muscle weakness (generalized): Secondary | ICD-10-CM | POA: Diagnosis not present

## 2014-04-22 DIAGNOSIS — M159 Polyosteoarthritis, unspecified: Secondary | ICD-10-CM | POA: Diagnosis not present

## 2014-04-22 DIAGNOSIS — R279 Unspecified lack of coordination: Secondary | ICD-10-CM | POA: Diagnosis not present

## 2014-04-22 DIAGNOSIS — S72009D Fracture of unspecified part of neck of unspecified femur, subsequent encounter for closed fracture with routine healing: Secondary | ICD-10-CM | POA: Diagnosis not present

## 2014-05-01 DIAGNOSIS — S72009D Fracture of unspecified part of neck of unspecified femur, subsequent encounter for closed fracture with routine healing: Secondary | ICD-10-CM | POA: Diagnosis not present

## 2014-05-01 DIAGNOSIS — M6281 Muscle weakness (generalized): Secondary | ICD-10-CM | POA: Diagnosis not present

## 2014-05-01 DIAGNOSIS — M159 Polyosteoarthritis, unspecified: Secondary | ICD-10-CM | POA: Diagnosis not present

## 2014-05-01 DIAGNOSIS — R279 Unspecified lack of coordination: Secondary | ICD-10-CM | POA: Diagnosis not present

## 2014-05-02 DIAGNOSIS — M159 Polyosteoarthritis, unspecified: Secondary | ICD-10-CM | POA: Diagnosis not present

## 2014-05-02 DIAGNOSIS — S72009D Fracture of unspecified part of neck of unspecified femur, subsequent encounter for closed fracture with routine healing: Secondary | ICD-10-CM | POA: Diagnosis not present

## 2014-05-02 DIAGNOSIS — M6281 Muscle weakness (generalized): Secondary | ICD-10-CM | POA: Diagnosis not present

## 2014-05-02 DIAGNOSIS — R279 Unspecified lack of coordination: Secondary | ICD-10-CM | POA: Diagnosis not present

## 2014-05-05 DIAGNOSIS — S72009D Fracture of unspecified part of neck of unspecified femur, subsequent encounter for closed fracture with routine healing: Secondary | ICD-10-CM | POA: Diagnosis not present

## 2014-05-05 DIAGNOSIS — R279 Unspecified lack of coordination: Secondary | ICD-10-CM | POA: Diagnosis not present

## 2014-05-05 DIAGNOSIS — M6281 Muscle weakness (generalized): Secondary | ICD-10-CM | POA: Diagnosis not present

## 2014-05-05 DIAGNOSIS — M159 Polyosteoarthritis, unspecified: Secondary | ICD-10-CM | POA: Diagnosis not present

## 2014-05-07 ENCOUNTER — Telehealth: Payer: Self-pay | Admitting: Endocrinology

## 2014-05-07 DIAGNOSIS — E039 Hypothyroidism, unspecified: Secondary | ICD-10-CM

## 2014-05-07 NOTE — Telephone Encounter (Signed)
TSH done last month was 0.265, currently on 250 mcg. She will reduce her dosage by one tablet a week, order faxed

## 2014-05-09 DIAGNOSIS — Z23 Encounter for immunization: Secondary | ICD-10-CM | POA: Diagnosis not present

## 2014-05-12 DIAGNOSIS — M159 Polyosteoarthritis, unspecified: Secondary | ICD-10-CM | POA: Diagnosis not present

## 2014-05-12 DIAGNOSIS — S72009D Fracture of unspecified part of neck of unspecified femur, subsequent encounter for closed fracture with routine healing: Secondary | ICD-10-CM | POA: Diagnosis not present

## 2014-05-12 DIAGNOSIS — M6281 Muscle weakness (generalized): Secondary | ICD-10-CM | POA: Diagnosis not present

## 2014-05-12 DIAGNOSIS — R279 Unspecified lack of coordination: Secondary | ICD-10-CM | POA: Diagnosis not present

## 2014-05-13 DIAGNOSIS — H353290 Exudative age-related macular degeneration, unspecified eye, stage unspecified: Secondary | ICD-10-CM | POA: Diagnosis not present

## 2014-05-13 DIAGNOSIS — H43819 Vitreous degeneration, unspecified eye: Secondary | ICD-10-CM | POA: Diagnosis not present

## 2014-05-13 DIAGNOSIS — Z961 Presence of intraocular lens: Secondary | ICD-10-CM | POA: Diagnosis not present

## 2014-05-13 DIAGNOSIS — H353190 Nonexudative age-related macular degeneration, unspecified eye, stage unspecified: Secondary | ICD-10-CM | POA: Diagnosis not present

## 2014-08-07 ENCOUNTER — Other Ambulatory Visit: Payer: Medicare Other

## 2014-08-07 ENCOUNTER — Encounter: Payer: Self-pay | Admitting: Endocrinology

## 2014-08-07 ENCOUNTER — Ambulatory Visit (INDEPENDENT_AMBULATORY_CARE_PROVIDER_SITE_OTHER): Payer: Medicare Other | Admitting: Endocrinology

## 2014-08-07 VITALS — BP 132/60 | HR 63 | Temp 98.2°F | Resp 14 | Ht 61.0 in | Wt 111.0 lb

## 2014-08-07 DIAGNOSIS — E039 Hypothyroidism, unspecified: Secondary | ICD-10-CM | POA: Diagnosis not present

## 2014-08-07 DIAGNOSIS — E038 Other specified hypothyroidism: Secondary | ICD-10-CM

## 2014-08-07 DIAGNOSIS — E063 Autoimmune thyroiditis: Secondary | ICD-10-CM

## 2014-08-07 LAB — T4, FREE: Free T4: 1.05 ng/dL (ref 0.60–1.60)

## 2014-08-07 LAB — TSH: TSH: 0.85 u[IU]/mL (ref 0.35–4.50)

## 2014-08-07 NOTE — Progress Notes (Signed)
Quick Note:  Please let son know that the lab result is normal and no further action needed ______

## 2014-08-07 NOTE — Progress Notes (Signed)
Patient ID: [name], female   DOB: 1920/03/18, 80 y.o.   MRN: [id number]   Reason for Appointment:  Hypothyroidism, f/u visit   History of Present Illness:    HYPOTHYROIDISM  was first diagnosed in 1948 when she had thyroidectomy for Graves' disease   The symptoms consistent with hypothyroidism  previously were: feeling sleepy during the day despite sleeping well at night and needing naps. She also has had some dry skin but no significant weight gain or cold intolerance. She has had some hair loss which is not new  The treatments that the patient has taken include Synthroid in increasing doses.   Since she was getting iron and calcium tablets in the morning with her Synthroid timing of these was changed on visit in 02/2013.  She is now taking her Synthroid 7 AM and multivitamin at 8 AM. She is taking her calcium supplement at lunch and iron at 5 pm This was again verified on her MAR today Her Synthroid dose has been adjusted periodically since her initial visit  On her last visit in 6/15  her Synthroid dosage was reduced and subsequently in 9/15 with TSH mildly low her Synthroid was changed to 250 g on days a week and 125 mg on the seventh day She is now here for follow-up  She continues to feel fairly well although difficult to get a good history because of her dementia. She is reportedly not getting excessive sleepiness or fatigue  Wt Readings from Last 3 Encounters:  08/07/14 111 lb (50.349 kg)  02/06/14 111 lb 6.4 oz (50.531 kg)  08/08/13 113 lb 4.8 oz (51.393 kg)   TSH from today is pending  Previous TSH levels: on 02/15/13;  result was 21.34 TSH in 9/15 was 0.265  Lab Results  Component Value Date   TSH 0.06* 02/06/2014   TSH 7.47* 05/15/2013   FREET4 1.45 02/06/2014   FREET4 0.54* 05/15/2013          Past Medical History  Diagnosis Date  . Thyroid disease   . Hypertension   . Dementia   . Anemia   . Gastric ulcer     gi bleed secondary  . Renal disorder    . Vertigo     Past Surgical History  Procedure Laterality Date  . Thyroid surgery    . Mastectomy    . Carpal tunnel release    . Abdominal hysterectomy    . Femur im nail  05/13/2012    Procedure: INTRAMEDULLARY (IM) NAIL FEMORAL;  Surgeon: Senaida LangeKevin M Supple, MD;  Location: [facility] OR;  Service: Orthopedics;  Laterality: Right;  . Fracture surgery      Family History  Problem Relation Age of Onset  . Thyroid disease Neg Hx    Social History:  reports that she has never smoked. She does not have any smokeless tobacco history on file. She reports that she does not drink alcohol or use illicit drugs.  Allergies:  Allergies  Allergen Reactions  . Amoxicillin-Pot Clavulanate     REACTION: UNKNOWN REACTION  . Cephalexin     REACTION: UNKNOWN REACTION  . Nsaids       Medication List       This list is accurate as of: 08/07/14 11:07 AM.  Always use your most recent med list.               acetaminophen 500 MG tablet  Commonly known as:  TYLENOL  Take 1,000 mg by mouth at bedtime.  ALPRAZolam 0.25 MG tablet  Commonly known as:  XANAX  Take 1 tablet (0.25 mg total) by mouth 2 (two) times daily.     Biotin 300 MCG Tabs  Take 600 mcg by mouth daily.     calcium-vitamin D 500-200 MG-UNIT per tablet  Commonly known as:  OSCAL WITH D  Take 1 tablet by mouth daily.     DAILY VITE Tabs  Take 1 tablet by mouth daily.     donepezil 10 MG tablet  Commonly known as:  ARICEPT  Take 10 mg by mouth daily.     escitalopram 10 MG tablet  Commonly known as:  LEXAPRO  Take 10 mg by mouth daily.     feeding supplement (ENSURE COMPLETE) Liqd  Take 237 mLs by mouth as needed (for nutritional supplementation).     ferrous sulfate 325 (65 FE) MG tablet  Take 1 tablet (325 mg total) by mouth 3 (three) times daily with meals.     fluticasone 50 MCG/ACT nasal spray  Commonly known as:  FLONASE     loratadine 10 MG tablet  Commonly known as:  CLARITIN  Take 10 mg by mouth daily.      losartan 50 MG tablet  Commonly known as:  COZAAR  Take 50 mg by mouth daily.     Lutein 6 MG Caps  Take 1 capsule by mouth daily.     magnesium hydroxide 400 MG/5ML suspension  Commonly known as:  MILK OF MAGNESIA  Take 30 mLs by mouth daily as needed. Constipation.     meclizine 25 MG tablet  Commonly known as:  ANTIVERT  Take 25 mg by mouth 3 (three) times daily as needed for dizziness.     memantine 10 MG tablet  Commonly known as:  NAMENDA  Take 10 mg by mouth 2 (two) times daily.     pantoprazole 40 MG tablet  Commonly known as:  PROTONIX  Take 40 mg by mouth daily at 12 noon.     senna 8.6 MG Tabs tablet  Commonly known as:  SENOKOT  Take 1 tablet (8.6 mg total) by mouth 2 (two) times daily.     SYNTHROID 125 MCG tablet  Generic drug:  levothyroxine  TAKE 2 TABLETS ( ) BY MOUTH EACH MORNING.        Review of Systems:            Patient has long-standing history of dementia Has positive history of depression treated with Celexa   History of Iron deficiency anemia        Examination:    BP 132/60 mmHg  Pulse 63  Temp(Src) 98.2 F (36.8 C)  Resp 14  Ht 5\' 1"  (1.549 m)  Wt 111 lb (50.349 kg)  BMI 20.98 kg/m2  SpO2 96%   General Appearance: pleasant, alert, conversing pleasantly          Eyes:  normal appearance         Neck: The thyroid is nonpalpable.  Deep tendon reflexes : at biceps are normal.         Assessments .   Postsurgical Hypothyroidism, long-standing  She has been symptomatically better with changes in her Synthroid supplement in 2014 She also benefited significantly with changing her Synthroid and supplement administration times to avoid interaction with her calcium and iron Her last TSH was slightly low and her dose was reduced by 125 g weekly  Currently she appears to be doing well and clinically looks euthyroid  To decide  dosage of Synthroid based on TSH level today, followup in 6 months unlessanother dose changes  needed  Chayne Baumgart 08/07/2014, 11:07 AM

## 2014-08-11 ENCOUNTER — Encounter (HOSPITAL_COMMUNITY): Payer: Self-pay | Admitting: Cardiology

## 2014-08-11 ENCOUNTER — Emergency Department (HOSPITAL_COMMUNITY)
Admission: EM | Admit: 2014-08-11 | Discharge: 2014-08-11 | Disposition: A | Discharge status: Home / Self Care | Payer: Medicare Other | Attending: Emergency Medicine | Admitting: Emergency Medicine

## 2014-08-11 ENCOUNTER — Emergency Department (HOSPITAL_COMMUNITY): Payer: Medicare Other

## 2014-08-11 DIAGNOSIS — Y9389 Activity, other specified: Secondary | ICD-10-CM | POA: Insufficient documentation

## 2014-08-11 DIAGNOSIS — Z87448 Personal history of other diseases of urinary system: Secondary | ICD-10-CM | POA: Insufficient documentation

## 2014-08-11 DIAGNOSIS — Z79899 Other long term (current) drug therapy: Secondary | ICD-10-CM | POA: Diagnosis not present

## 2014-08-11 DIAGNOSIS — S5001XA Contusion of right elbow, initial encounter: Secondary | ICD-10-CM

## 2014-08-11 DIAGNOSIS — W06XXXA Fall from bed, initial encounter: Secondary | ICD-10-CM | POA: Diagnosis not present

## 2014-08-11 DIAGNOSIS — S79919A Unspecified injury of unspecified hip, initial encounter: Secondary | ICD-10-CM | POA: Diagnosis not present

## 2014-08-11 DIAGNOSIS — Y998 Other external cause status: Secondary | ICD-10-CM | POA: Diagnosis not present

## 2014-08-11 DIAGNOSIS — S4991XA Unspecified injury of right shoulder and upper arm, initial encounter: Secondary | ICD-10-CM | POA: Insufficient documentation

## 2014-08-11 DIAGNOSIS — I10 Essential (primary) hypertension: Secondary | ICD-10-CM | POA: Diagnosis not present

## 2014-08-11 DIAGNOSIS — F0390 Unspecified dementia without behavioral disturbance: Secondary | ICD-10-CM | POA: Diagnosis not present

## 2014-08-11 DIAGNOSIS — Z880 Allergy status to penicillin: Secondary | ICD-10-CM | POA: Diagnosis not present

## 2014-08-11 DIAGNOSIS — G4489 Other headache syndrome: Secondary | ICD-10-CM | POA: Diagnosis not present

## 2014-08-11 DIAGNOSIS — R51 Headache: Secondary | ICD-10-CM | POA: Diagnosis not present

## 2014-08-11 DIAGNOSIS — S098XXA Other specified injuries of head, initial encounter: Secondary | ICD-10-CM | POA: Diagnosis not present

## 2014-08-11 DIAGNOSIS — Y9289 Other specified places as the place of occurrence of the external cause: Secondary | ICD-10-CM | POA: Insufficient documentation

## 2014-08-11 DIAGNOSIS — M25521 Pain in right elbow: Secondary | ICD-10-CM | POA: Diagnosis not present

## 2014-08-11 DIAGNOSIS — W19XXXA Unspecified fall, initial encounter: Secondary | ICD-10-CM

## 2014-08-11 DIAGNOSIS — M25511 Pain in right shoulder: Secondary | ICD-10-CM | POA: Diagnosis not present

## 2014-08-11 DIAGNOSIS — S0990XA Unspecified injury of head, initial encounter: Secondary | ICD-10-CM | POA: Insufficient documentation

## 2014-08-11 DIAGNOSIS — Z8719 Personal history of other diseases of the digestive system: Secondary | ICD-10-CM | POA: Diagnosis not present

## 2014-08-11 DIAGNOSIS — S59901A Unspecified injury of right elbow, initial encounter: Secondary | ICD-10-CM | POA: Diagnosis not present

## 2014-08-11 DIAGNOSIS — S199XXA Unspecified injury of neck, initial encounter: Secondary | ICD-10-CM | POA: Diagnosis not present

## 2014-08-11 DIAGNOSIS — M25559 Pain in unspecified hip: Secondary | ICD-10-CM | POA: Diagnosis not present

## 2014-08-11 DIAGNOSIS — E079 Disorder of thyroid, unspecified: Secondary | ICD-10-CM | POA: Insufficient documentation

## 2014-08-11 MED ORDER — TRAMADOL HCL 50 MG PO TABS: 50.0000 mg | ORAL_TABLET | Freq: Four times a day (QID) | ORAL | Status: DC | PRN

## 2014-08-11 NOTE — ED Notes (Signed)
Was reaching for walker this morning and fell backwards.  C/o pain to back of head  And right elbow.

## 2014-08-11 NOTE — ED Provider Notes (Signed)
CSN: [id number]     Arrival date & time 08/11/14  4098 History  This chart was scribed for Vanetta Mulders, MD by Abel Presto, ED Scribe. This patient was seen in room APA14/APA14 and the patient's care was started at 8:46 AM.    Chief Complaint  Patient presents with  . Fall    Patient is a 80 y.o. female presenting with fall. The history is provided by the patient. No language interpreter was used.  Fall Associated symptoms include headaches. Pertinent negatives include no chest pain, no abdominal pain and no shortness of breath.    HPI Comments: [name] is a 80 y.o. female who presents to the Emergency Department complaining of a fall around 7:00 AM.  Pt notes she put her feet on the floor, and tried to reach for her walker from a sitting position on her bed.  She states the walker was not close enough and she fell to the floor. Pt denies hitting any furniture. Pt notes associated right elbow pain and generalized pain in the back of her head.  Pt notes a scratch on her elbow.  Pt has not ambulated since fall. She has a PMHx of fractures in each wrist. Pt lives at [facility]. Pt denies LOC, hip pain, and any other injury.    Past Medical History  Diagnosis Date  . Thyroid disease   . Hypertension   . Dementia   . Anemia   . Gastric ulcer     gi bleed secondary  . Renal disorder   . Vertigo    Past Surgical History  Procedure Laterality Date  . Thyroid surgery    . Mastectomy    . Carpal tunnel release    . Abdominal hysterectomy    . Femur im nail  05/13/2012    Procedure: INTRAMEDULLARY (IM) NAIL FEMORAL;  Surgeon: Senaida Lange, MD;  Location: [facility] OR;  Service: Orthopedics;  Laterality: Right;  . Fracture surgery     Family History  Problem Relation Age of Onset  . Thyroid disease Neg Hx    History  Substance Use Topics  . Smoking status: Never Smoker   . Smokeless tobacco: Not on file  . Alcohol Use: No   OB History    No data  available     Review of Systems  Constitutional: Negative for fever and chills.  HENT: Negative for rhinorrhea and sore throat.   Eyes: Negative for visual disturbance.  Respiratory: Negative for shortness of breath.   Cardiovascular: Negative for chest pain and leg swelling.  Gastrointestinal: Negative for nausea, vomiting, abdominal pain and diarrhea.  Genitourinary: Negative for dysuria.  Musculoskeletal: Negative for back pain and neck pain.  Skin: Negative for rash.  Neurological: Positive for headaches.  Hematological: Does not bruise/bleed easily.      Allergies  Amoxicillin-pot clavulanate; Cephalexin; and Nsaids  Home Medications   Prior to Admission medications   Medication Sig Start Date End Date Taking? Authorizing Provider  acetaminophen (TYLENOL) 500 MG tablet Take 1,000 mg by mouth at bedtime.   Yes Historical Provider, MD  Biotin 300 MCG TABS Take 300 mcg by mouth daily.    Yes Historical Provider, MD  calcium-vitamin D (OSCAL WITH D) 500-200 MG-UNIT per tablet Take 1 tablet by mouth daily.   Yes Historical Provider, MD  donepezil (ARICEPT) 10 MG tablet Take 10 mg by mouth daily.    Yes Historical Provider, MD  escitalopram (LEXAPRO) 10 MG tablet Take 10 mg  by mouth daily.   Yes Historical Provider, MD  ferrous sulfate 325 (65 FE) MG tablet Take 1 tablet (325 mg total) by mouth 3 (three) times daily with meals. Patient taking differently: Take 325 mg by mouth at bedtime.  05/24/12  Yes Erick Blinks, MD  levothyroxine (SYNTHROID) 125 MCG tablet Take 125-250 mcg by mouth daily before breakfast. Patient takes Mon-Sat. And on Sunday   Yes Historical Provider, MD  loratadine (CLARITIN) 10 MG tablet Take 10 mg by mouth daily.   Yes Historical Provider, MD  losartan (COZAAR) 50 MG tablet Take 50 mg by mouth daily.   Yes Historical Provider, MD  Lutein 6 MG CAPS Take 1 capsule by mouth daily.   Yes Historical Provider, MD  memantine (NAMENDA) 10 MG tablet  Take 10 mg by mouth 2 (two) times daily.   Yes Historical Provider, MD  Multiple Vitamin (DAILY VITE) TABS Take 1 tablet by mouth daily.   Yes Historical Provider, MD  Multiple Vitamins-Minerals (PRESERVISION AREDS 2) CAPS Take 1 capsule by mouth 2 (two) times daily.   Yes Historical Provider, MD  pantoprazole (PROTONIX) 40 MG tablet Take 40 mg by mouth daily.  05/24/12  Yes Erick Blinks, MD  Polyethyl Glycol-Propyl Glycol (SYSTANE OP) Apply 1 drop to eye 4 (four) times daily.   Yes Historical Provider, MD  senna (SENOKOT) 8.6 MG TABS Take 1 tablet (8.6 mg total) by mouth 2 (two) times daily. 05/16/12  Yes Leroy Sea, MD  ALPRAZolam Prudy Feeler) 0.25 MG tablet Take 1 tablet (0.25 mg total) by mouth 2 (two) times daily. Patient not taking: Reported on 08/11/2014 05/16/12   Leroy Sea, MD  feeding supplement (ENSURE COMPLETE) LIQD Take 237 mLs by mouth as needed (for nutritional supplementation). Patient not taking: Reported on 08/11/2014 05/16/12   Leroy Sea, MD  meclizine (ANTIVERT) 25 MG tablet Take 25 mg by mouth 3 (three) times daily as needed for dizziness.    Historical Provider, MD  SYNTHROID 125 MCG tablet TAKE 2 TABLETS ( ) BY MOUTH EACH MORNING. Patient not taking: Reported on 08/11/2014 04/07/14   Reather Littler, MD  traMADol (ULTRAM) 50 MG tablet Take 1 tablet (50 mg total) by mouth every 6 (six) hours as needed. 08/11/14   Vanetta Mulders, MD   BP 161/92 mmHg  Pulse 66  Temp(Src) 98 F (36.7 C) (Oral)  Resp 18  Ht 5\' 1"  (1.549 m)  Wt 111 lb (50.349 kg)  BMI 20.98 kg/m2  SpO2 94% Physical Exam  Constitutional: She is oriented to person, place, and time. She appears well-developed and well-nourished.  HENT:  Head: Normocephalic.   2x2 cm area of swelling in center of back of head  Eyes: Conjunctivae are normal.  Neck: Normal range of motion. Neck supple.  Cardiovascular: Regular rhythm and normal heart sounds.  Exam reveals no friction rub.   No murmur  heard. Pulmonary/Chest: Effort normal and breath sounds normal. No respiratory distress. She has no wheezes. She has no rales.  Abdominal: Soft. Bowel sounds are normal. There is no tenderness.  Musculoskeletal: Normal range of motion. She exhibits no edema.  Neurological: She is alert and oriented to person, place, and time.  Skin: Skin is warm and dry.  Psychiatric: She has a normal mood and affect. Her behavior is normal.  Nursing note and vitals reviewed.   ED Course  Procedures (including critical care time) DIAGNOSTIC STUDIES: Oxygen Saturation is 94% on room air, adequate by my interpretation.  COORDINATION OF CARE: 8:53 AM Discussed treatment plan with patient at beside, the patient agrees with the plan and has no further questions at this time.   Labs Review Labs Reviewed - No data to display  Imaging Review Dg Shoulder Right  08/11/2014   CLINICAL DATA:  Larey SeatFell this morning when trying to stand and reach for her walker, RIGHT shoulder pain  EXAM: RIGHT SHOULDER - 2+ VIEW  COMPARISON:  12/26/2007  FINDINGS: Diffuse osseous demineralization.  AC joint alignment normal.  RIGHT glenohumeral degenerative changes.  Superior subluxation of RIGHT humeral head approximating undersurface of acromion consistent with chronic rotator cuff tear.  No acute fracture, dislocation, or bone destruction.  IMPRESSION: Osseous demineralization with RIGHT glenohumeral degenerative changes and chronic rotator cuff tear.  No acute abnormalities.   Electronically Signed   By: Ulyses SouthwardMark  Boles M.D.   On: 08/11/2014 10:21   Dg Elbow Complete Right  08/11/2014   CLINICAL DATA:  Larey SeatFell this morning, fell from bed to floor. Elbow pain.  EXAM: RIGHT ELBOW - COMPLETE 3+ VIEW  COMPARISON:  None.  FINDINGS: No evidence of fracture of the ulna or humerus. The radial head is normal. No joint effusion.  IMPRESSION: No fracture or dislocation.   Electronically Signed   By: Genevive BiStewart  Edmunds M.D.   On: 08/11/2014 10:17   Dg  Hip Bilateral W/pelvis  08/11/2014   CLINICAL DATA:  Bilateral hip pain after fall.  Fall this morning.  EXAM: BILATERAL HIP WITH PELVIS - 4+ VIEW  COMPARISON:  CT right hip 05/21/2012  FINDINGS: Post ORIF of remote right hip fracture. The hardware is intact, no periprosthetic fracture. Posttraumatic deformity noted of the lesser trochanter. There is no acute fracture in the pelvis or both hips. Pubic symphysis remains congruent with degenerative change. There is degenerative change of both hips with acetabular spurring. No dislocation.  IMPRESSION: No acute fracture in the pelvis or both hips. Intact right hip hardware.   Electronically Signed   By: Rubye OaksMelanie  Ehinger M.D.   On: 08/11/2014 10:14   Ct Head Wo Contrast  08/11/2014   CLINICAL DATA:  Fall backwards this morning with head injury. No loss of consciousness. Headache.  EXAM: CT HEAD WITHOUT CONTRAST  CT CERVICAL SPINE WITHOUT CONTRAST  TECHNIQUE: Multidetector CT imaging of the head and cervical spine was performed following the standard protocol without intravenous contrast. Multiplanar CT image reconstructions of the cervical spine were also generated.  COMPARISON:  Head CT 07/30/2012.  Cervical spine CT 05/13/2012.  FINDINGS: CT HEAD FINDINGS  There is no evidence of acute cortical infarct, intracranial hemorrhage, mass, midline shift, or extra-axial fluid collection. Cerebral atrophy does not appear significantly changed. Periventricular white-matter hypodensities are similar to the prior study and nonspecific but compatible with mild chronic small vessel ischemic disease  Prior bilateral cataract extraction is noted. There is minimal right mastoid air cell opacification. Visualized paranasal sinuses are clear. No skull fracture is identified.  CT CERVICAL SPINE FINDINGS  Grade 1 anterolisthesis is again seen of C3 on C4, C4 on C5, C5 on C6, C6 on C7, and C7 on T1, not significantly changed and measuring up to 4 mm at C6-7, likely degenerative and  related to advanced multilevel facet arthrosis, greater on the right. Bilateral facet ankylosis is again seen at C2-3 with progressive degenerative changes at C1-2. There is also right-sided facet ankylosis at C4-5. Mild to moderate disc space narrowing is again seen from C3-4 to C6-7. No acute cervical spine fracture is identified. Mild  scarring is noted in the lung apices.  IMPRESSION: 1. No evidence of acute intracranial abnormality. 2. No acute osseous abnormality identified in the cervical spine. Diffuse cervical disc and facet degeneration as above.   Electronically Signed   By: Sebastian AcheAllen  Grady   On: 08/11/2014 10:16   Ct Cervical Spine Wo Contrast  08/11/2014   CLINICAL DATA:  Fall backwards this morning with head injury. No loss of consciousness. Headache.  EXAM: CT HEAD WITHOUT CONTRAST  CT CERVICAL SPINE WITHOUT CONTRAST  TECHNIQUE: Multidetector CT imaging of the head and cervical spine was performed following the standard protocol without intravenous contrast. Multiplanar CT image reconstructions of the cervical spine were also generated.  COMPARISON:  Head CT 07/30/2012.  Cervical spine CT 05/13/2012.  FINDINGS: CT HEAD FINDINGS  There is no evidence of acute cortical infarct, intracranial hemorrhage, mass, midline shift, or extra-axial fluid collection. Cerebral atrophy does not appear significantly changed. Periventricular white-matter hypodensities are similar to the prior study and nonspecific but compatible with mild chronic small vessel ischemic disease  Prior bilateral cataract extraction is noted. There is minimal right mastoid air cell opacification. Visualized paranasal sinuses are clear. No skull fracture is identified.  CT CERVICAL SPINE FINDINGS  Grade 1 anterolisthesis is again seen of C3 on C4, C4 on C5, C5 on C6, C6 on C7, and C7 on T1, not significantly changed and measuring up to 4 mm at C6-7, likely degenerative and related to advanced multilevel facet arthrosis, greater on the  right. Bilateral facet ankylosis is again seen at C2-3 with progressive degenerative changes at C1-2. There is also right-sided facet ankylosis at C4-5. Mild to moderate disc space narrowing is again seen from C3-4 to C6-7. No acute cervical spine fracture is identified. Mild scarring is noted in the lung apices.  IMPRESSION: 1. No evidence of acute intracranial abnormality. 2. No acute osseous abnormality identified in the cervical spine. Diffuse cervical disc and facet degeneration as above.   Electronically Signed   By: Sebastian AcheAllen  Grady   On: 08/11/2014 10:16     EKG Interpretation None      MDM   Final diagnoses:  Fall  Head injury, initial encounter  Elbow contusion, right, initial encounter    Patient status post fall this morning. Hit the back of her head. CT of head and neck without any acute findings. Also complaining of some right shoulder and elbow pain x-rays of that area negative for any bony injury. Patient has good range of motion of both areas. Patient without complaint of any hip pain but has not been on her feet since the fall. X-rays of that area negative as well. Patient nontoxic no acute distress. Patient stable for discharge home.    I personally performed the services described in this documentation, which was scribed in my presence. The recorded information has been reviewed and is accurate.      Vanetta MuldersScott Alexiz Sustaita, MD 08/11/14 1034

## 2014-08-11 NOTE — Discharge Instructions (Signed)
Workup for the fall to include head CT CT of neck x-ray of shoulder and elbow on the right and both hips without any significant injuries. Take the tramadol as needed. Follow-up with your doctor if not improved in a few days. Return for any new or worse symptoms.

## 2014-08-19 DIAGNOSIS — M79641 Pain in right hand: Secondary | ICD-10-CM | POA: Diagnosis not present

## 2014-09-17 ENCOUNTER — Encounter (HOSPITAL_COMMUNITY): Payer: Self-pay | Admitting: Cardiology

## 2014-09-17 ENCOUNTER — Emergency Department (HOSPITAL_COMMUNITY)
Admission: EM | Admit: 2014-09-17 | Discharge: 2014-09-17 | Disposition: A | Discharge status: Home / Self Care | Payer: Medicare Other | Attending: Emergency Medicine | Admitting: Emergency Medicine

## 2014-09-17 ENCOUNTER — Emergency Department (HOSPITAL_COMMUNITY): Payer: Medicare Other

## 2014-09-17 DIAGNOSIS — Z87448 Personal history of other diseases of urinary system: Secondary | ICD-10-CM | POA: Diagnosis not present

## 2014-09-17 DIAGNOSIS — Z79899 Other long term (current) drug therapy: Secondary | ICD-10-CM | POA: Diagnosis not present

## 2014-09-17 DIAGNOSIS — Y9389 Activity, other specified: Secondary | ICD-10-CM | POA: Diagnosis not present

## 2014-09-17 DIAGNOSIS — Y92003 Bedroom of unspecified non-institutional (private) residence as the place of occurrence of the external cause: Secondary | ICD-10-CM | POA: Diagnosis not present

## 2014-09-17 DIAGNOSIS — D649 Anemia, unspecified: Secondary | ICD-10-CM | POA: Diagnosis not present

## 2014-09-17 DIAGNOSIS — E079 Disorder of thyroid, unspecified: Secondary | ICD-10-CM | POA: Insufficient documentation

## 2014-09-17 DIAGNOSIS — S51011A Laceration without foreign body of right elbow, initial encounter: Secondary | ICD-10-CM | POA: Diagnosis not present

## 2014-09-17 DIAGNOSIS — I10 Essential (primary) hypertension: Secondary | ICD-10-CM | POA: Insufficient documentation

## 2014-09-17 DIAGNOSIS — W06XXXA Fall from bed, initial encounter: Secondary | ICD-10-CM | POA: Insufficient documentation

## 2014-09-17 DIAGNOSIS — Z8719 Personal history of other diseases of the digestive system: Secondary | ICD-10-CM | POA: Diagnosis not present

## 2014-09-17 DIAGNOSIS — Y998 Other external cause status: Secondary | ICD-10-CM | POA: Insufficient documentation

## 2014-09-17 DIAGNOSIS — S2231XA Fracture of one rib, right side, initial encounter for closed fracture: Secondary | ICD-10-CM | POA: Diagnosis not present

## 2014-09-17 DIAGNOSIS — Z880 Allergy status to penicillin: Secondary | ICD-10-CM | POA: Diagnosis not present

## 2014-09-17 DIAGNOSIS — S299XXA Unspecified injury of thorax, initial encounter: Secondary | ICD-10-CM | POA: Diagnosis present

## 2014-09-17 DIAGNOSIS — F0390 Unspecified dementia without behavioral disturbance: Secondary | ICD-10-CM | POA: Diagnosis not present

## 2014-09-17 DIAGNOSIS — R109 Unspecified abdominal pain: Secondary | ICD-10-CM | POA: Diagnosis not present

## 2014-09-17 DIAGNOSIS — S2241XA Multiple fractures of ribs, right side, initial encounter for closed fracture: Secondary | ICD-10-CM | POA: Diagnosis not present

## 2014-09-17 MED ORDER — HYDROCODONE-ACETAMINOPHEN 5-325 MG PO TABS: 0.5000 | ORAL_TABLET | ORAL | Status: DC | PRN

## 2014-09-17 NOTE — ED Provider Notes (Addendum)
CSN: [id number]     Arrival date & time 09/17/14  0714 History  This chart was scribed for Gilda Creasehristopher J. Pollina, by Milly JakobJohn Lee Graves, ED Scribe. The patient was seen in room APA06/APA06. Patient's care was started at 7:19 AM.   Chief Complaint  Patient presents with  . Fall   The history is provided by the patient. No language interpreter was used.   HPI Comments: [name] is a 81 y.o. female who was brought by EMS to the Emergency Department after [facility]. She denies head injury or LOC. She reports constant, throbbing, right-sided, rib pain. She states that the pain is exacerbated by cough and deep breathing.   Past Medical History  Diagnosis Date  . Thyroid disease   . Hypertension   . Dementia   . Anemia   . Gastric ulcer     gi bleed secondary  . Renal disorder   . Vertigo    Past Surgical History  Procedure Laterality Date  . Thyroid surgery    . Mastectomy    . Carpal tunnel release    . Abdominal hysterectomy    . Femur im nail  05/13/2012    Procedure: INTRAMEDULLARY (IM) NAIL FEMORAL;  Surgeon: Senaida LangeKevin M Supple, MD;  Location: [facility] OR;  Service: Orthopedics;  Laterality: Right;  . Fracture surgery     Family History  Problem Relation Age of Onset  . Thyroid disease Neg Hx    History  Substance Use Topics  . Smoking status: Never Smoker   . Smokeless tobacco: Not on file  . Alcohol Use: No   OB History    No data available     Review of Systems  Musculoskeletal: Positive for arthralgias (right ribs).  All other systems reviewed and are negative.  Allergies  Amoxicillin-pot clavulanate; Cephalexin; and Nsaids  Home Medications   Prior to Admission medications   Medication Sig Start Date End Date Taking? Authorizing Provider  acetaminophen (TYLENOL) 500 MG tablet Take 1,000 mg by mouth at bedtime.    Historical Provider, MD  ALPRAZolam Prudy Feeler(XANAX) 0.25 MG tablet Take 1 tablet (0.25 mg total) by mouth 2 (two) times  daily. Patient not taking: Reported on 08/11/2014 05/16/12   Leroy SeaPrashant K Singh, MD  Biotin 300 MCG TABS Take 300 mcg by mouth daily.     Historical Provider, MD  calcium-vitamin D (OSCAL WITH D) 500-200 MG-UNIT per tablet Take 1 tablet by mouth daily.    Historical Provider, MD  donepezil (ARICEPT) 10 MG tablet Take 10 mg by mouth daily.     Historical Provider, MD  escitalopram (LEXAPRO) 10 MG tablet Take 10 mg by mouth daily.    Historical Provider, MD  feeding supplement (ENSURE COMPLETE) LIQD Take 237 mLs by mouth as needed (for nutritional supplementation). Patient not taking: Reported on 08/11/2014 05/16/12   Leroy SeaPrashant K Singh, MD  ferrous sulfate 325 (65 FE) MG tablet Take 1 tablet (325 mg total) by mouth 3 (three) times daily with meals. Patient taking differently: Take 325 mg by mouth at bedtime.  05/24/12   Erick BlinksJehanzeb Memon, MD  levothyroxine (SYNTHROID) 125 MCG tablet Take 125-250 mcg by mouth daily before breakfast. Patient takes 250mcg Mon-Sat. And 125mcg on Sunday    Historical Provider, MD  loratadine (CLARITIN) 10 MG tablet Take 10 mg by mouth daily.    Historical Provider, MD  losartan (COZAAR) 50 MG tablet Take 50 mg by mouth daily.    Historical  Provider, MD  Lutein 6 MG CAPS Take 1 capsule by mouth daily.    Historical Provider, MD  meclizine (ANTIVERT) 25 MG tablet Take 25 mg by mouth 3 (three) times daily as needed for dizziness.    Historical Provider, MD  memantine (NAMENDA) 10 MG tablet Take 10 mg by mouth 2 (two) times daily.    Historical Provider, MD  Multiple Vitamin (DAILY VITE) TABS Take 1 tablet by mouth daily.    Historical Provider, MD  Multiple Vitamins-Minerals (PRESERVISION AREDS 2) CAPS Take 1 capsule by mouth 2 (two) times daily.    Historical Provider, MD  pantoprazole (PROTONIX) 40 MG tablet Take 40 mg by mouth daily.  05/24/12   Erick Blinks, MD  Polyethyl Glycol-Propyl Glycol (SYSTANE OP) Apply 1 drop to eye 4 (four) times daily.    Historical Provider, MD    senna (SENOKOT) 8.6 MG TABS Take 1 tablet (8.6 mg total) by mouth 2 (two) times daily. 05/16/12   Leroy Sea, MD  SYNTHROID 125 MCG tablet TAKE 2 TABLETS ( ) BY MOUTH EACH MORNING. Patient not taking: Reported on 08/11/2014 04/07/14   Reather Littler, MD  traMADol (ULTRAM) 50 MG tablet Take 1 tablet (50 mg total) by mouth every 6 (six) hours as needed. 08/11/14   Vanetta Mulders, MD   Triage Vitals: BP 145/90 mmHg  Pulse 70  Temp(Src) 98.2 F (36.8 C) (Oral)  Resp 16  SpO2 96% Physical Exam  Constitutional: She is oriented to person, place, and time. She appears well-developed and well-nourished. No distress.  HENT:  Head: Normocephalic and atraumatic.  Right Ear: Hearing normal.  Left Ear: Hearing normal.  Nose: Nose normal.  Mouth/Throat: Oropharynx is clear and moist and mucous membranes are normal.  Eyes: Conjunctivae and EOM are normal. Pupils are equal, round, and reactive to light.  Neck: Normal range of motion. Neck supple.  Cardiovascular: Regular rhythm, S1 normal and S2 normal.  Exam reveals no gallop and no friction rub.   No murmur heard. Pulmonary/Chest: Effort normal and breath sounds normal. No respiratory distress. She exhibits no tenderness.  Abdominal: Soft. Normal appearance and bowel sounds are normal. There is no hepatosplenomegaly. There is no tenderness. There is no rebound, no guarding, no tenderness at McBurney's point and negative Murphy's sign. No hernia.  Musculoskeletal: Normal range of motion. She exhibits tenderness.       Right elbow: She exhibits normal range of motion, no swelling, no effusion and no deformity. No radial head, no medial epicondyle, no lateral epicondyle and no olecranon process tenderness noted.       Arms: Neurological: She is alert and oriented to person, place, and time. She has normal strength. No cranial nerve deficit or sensory deficit. Coordination normal. GCS eye subscore is 4. GCS verbal subscore is 5. GCS motor subscore  is 6.  Skin: Skin is warm, dry and intact. No rash noted. No cyanosis.  Psychiatric: She has a normal mood and affect. Her speech is normal and behavior is normal. Thought content normal.  Nursing note and vitals reviewed.   ED Course  Procedures (including critical care time) DIAGNOSTIC STUDIES: Oxygen Saturation is 96% on room air, normal by my interpretation.    COORDINATION OF CARE: 7:23 AM-Discussed treatment plan which includes right rib X-ray with pt at bedside and pt agreed to plan.   Labs Review Labs Reviewed - No data to display  Imaging Review Dg Ribs Unilateral W/chest Right  09/17/2014   CLINICAL DATA:  Fall right lower  rib pain  EXAM: RIGHT RIBS AND CHEST - 3+ VIEW  COMPARISON:  05/24/2012  FINDINGS: Four views right ribs submitted. Hyperinflation again noted. No acute infiltrate or pulmonary edema. There is probable nondisplaced fracture of the right eighth rib. Mild displaced fracture of the right ninth and tenth ribs. No pneumothorax.  IMPRESSION: No acute infiltrate or pulmonary edema. There is probable nondisplaced fracture of the right eighth rib. Mild displaced fracture of the right ninth and tenth ribs. No pneumothorax.   Electronically Signed   By: Natasha Mead M.D.   On: 09/17/2014 08:17     EKG Interpretation None      MDM   Final diagnoses:  None   right-sided rib fractures  Patient presents to the ER for evaluation of right rib pain. Patient reportedly fell out of bed this morning. She is awake and alert, no contusions were evidence of head injury. There was no loss of consciousness. Patient is at her normal mental status baseline and has a normal neurologic examination for her. She is not on blood thinners, does not require CT imaging of her head. Likewise neck and midline back was nontender, cervical and lower spine clinically cleared.  Patient did have tenderness in the right posterior lateral mid and lower lungs. X-ray confirms fractures that are  nondisplaced. No pulmonary contusion. Patient breathing comfortably. She will require analgesia and incentive spirometry. She will need to be monitored closely for change in pulmonary status, but is appropriate for return to [facility].  I personally performed the services described in this documentation, which was scribed in my presence. The recorded information has been reviewed and is accurate.     Gilda Crease, MD 09/17/14 1610  Gilda Crease, MD 09/17/14 0830

## 2014-09-17 NOTE — ED Notes (Signed)
Report called to Arn MedalMichelle [place], [place] [phone]).  Pt instructed on uses of incentive spirometry.  Ms. Marthenia Rollingchols informed of IS and Norco ordered.

## 2014-09-17 NOTE — ED Notes (Signed)
[facility]  Larey SeatFell out of bed this morning at 530am.  C/o right rib pain.  Skin tear right elbow.

## 2014-09-24 DIAGNOSIS — S2231XS Fracture of one rib, right side, sequela: Secondary | ICD-10-CM | POA: Diagnosis not present

## 2014-09-24 DIAGNOSIS — Z6821 Body mass index (BMI) 21.0-21.9, adult: Secondary | ICD-10-CM | POA: Diagnosis not present

## 2014-09-29 DIAGNOSIS — S7291XS Unspecified fracture of right femur, sequela: Secondary | ICD-10-CM | POA: Diagnosis not present

## 2014-09-29 DIAGNOSIS — F0150 Vascular dementia without behavioral disturbance: Secondary | ICD-10-CM | POA: Diagnosis not present

## 2014-09-29 DIAGNOSIS — R262 Difficulty in walking, not elsewhere classified: Secondary | ICD-10-CM | POA: Diagnosis not present

## 2014-09-29 DIAGNOSIS — M6281 Muscle weakness (generalized): Secondary | ICD-10-CM | POA: Diagnosis not present

## 2014-09-29 DIAGNOSIS — R41841 Cognitive communication deficit: Secondary | ICD-10-CM | POA: Diagnosis not present

## 2014-09-29 DIAGNOSIS — S2241XA Multiple fractures of ribs, right side, initial encounter for closed fracture: Secondary | ICD-10-CM | POA: Diagnosis not present

## 2014-09-29 DIAGNOSIS — Z9181 History of falling: Secondary | ICD-10-CM | POA: Diagnosis not present

## 2014-09-30 DIAGNOSIS — S7291XS Unspecified fracture of right femur, sequela: Secondary | ICD-10-CM | POA: Diagnosis not present

## 2014-09-30 DIAGNOSIS — R262 Difficulty in walking, not elsewhere classified: Secondary | ICD-10-CM | POA: Diagnosis not present

## 2014-09-30 DIAGNOSIS — F0150 Vascular dementia without behavioral disturbance: Secondary | ICD-10-CM | POA: Diagnosis not present

## 2014-09-30 DIAGNOSIS — Z9181 History of falling: Secondary | ICD-10-CM | POA: Diagnosis not present

## 2014-09-30 DIAGNOSIS — M6281 Muscle weakness (generalized): Secondary | ICD-10-CM | POA: Diagnosis not present

## 2014-09-30 DIAGNOSIS — R41841 Cognitive communication deficit: Secondary | ICD-10-CM | POA: Diagnosis not present

## 2014-10-01 DIAGNOSIS — Z9181 History of falling: Secondary | ICD-10-CM | POA: Diagnosis not present

## 2014-10-01 DIAGNOSIS — R41841 Cognitive communication deficit: Secondary | ICD-10-CM | POA: Diagnosis not present

## 2014-10-01 DIAGNOSIS — R262 Difficulty in walking, not elsewhere classified: Secondary | ICD-10-CM | POA: Diagnosis not present

## 2014-10-01 DIAGNOSIS — M6281 Muscle weakness (generalized): Secondary | ICD-10-CM | POA: Diagnosis not present

## 2014-10-01 DIAGNOSIS — F0150 Vascular dementia without behavioral disturbance: Secondary | ICD-10-CM | POA: Diagnosis not present

## 2014-10-01 DIAGNOSIS — S7291XS Unspecified fracture of right femur, sequela: Secondary | ICD-10-CM | POA: Diagnosis not present

## 2014-10-02 DIAGNOSIS — Z9181 History of falling: Secondary | ICD-10-CM | POA: Diagnosis not present

## 2014-10-02 DIAGNOSIS — S7291XS Unspecified fracture of right femur, sequela: Secondary | ICD-10-CM | POA: Diagnosis not present

## 2014-10-02 DIAGNOSIS — F0150 Vascular dementia without behavioral disturbance: Secondary | ICD-10-CM | POA: Diagnosis not present

## 2014-10-02 DIAGNOSIS — M6281 Muscle weakness (generalized): Secondary | ICD-10-CM | POA: Diagnosis not present

## 2014-10-02 DIAGNOSIS — R262 Difficulty in walking, not elsewhere classified: Secondary | ICD-10-CM | POA: Diagnosis not present

## 2014-10-02 DIAGNOSIS — R41841 Cognitive communication deficit: Secondary | ICD-10-CM | POA: Diagnosis not present

## 2014-10-07 DIAGNOSIS — R41841 Cognitive communication deficit: Secondary | ICD-10-CM | POA: Diagnosis not present

## 2014-10-07 DIAGNOSIS — F0150 Vascular dementia without behavioral disturbance: Secondary | ICD-10-CM | POA: Diagnosis not present

## 2014-10-07 DIAGNOSIS — M6281 Muscle weakness (generalized): Secondary | ICD-10-CM | POA: Diagnosis not present

## 2014-10-07 DIAGNOSIS — R262 Difficulty in walking, not elsewhere classified: Secondary | ICD-10-CM | POA: Diagnosis not present

## 2014-10-07 DIAGNOSIS — Z9181 History of falling: Secondary | ICD-10-CM | POA: Diagnosis not present

## 2014-10-07 DIAGNOSIS — S7291XS Unspecified fracture of right femur, sequela: Secondary | ICD-10-CM | POA: Diagnosis not present

## 2014-10-08 DIAGNOSIS — Z9181 History of falling: Secondary | ICD-10-CM | POA: Diagnosis not present

## 2014-10-08 DIAGNOSIS — S7291XS Unspecified fracture of right femur, sequela: Secondary | ICD-10-CM | POA: Diagnosis not present

## 2014-10-08 DIAGNOSIS — M6281 Muscle weakness (generalized): Secondary | ICD-10-CM | POA: Diagnosis not present

## 2014-10-08 DIAGNOSIS — R41841 Cognitive communication deficit: Secondary | ICD-10-CM | POA: Diagnosis not present

## 2014-10-08 DIAGNOSIS — R262 Difficulty in walking, not elsewhere classified: Secondary | ICD-10-CM | POA: Diagnosis not present

## 2014-10-08 DIAGNOSIS — F0150 Vascular dementia without behavioral disturbance: Secondary | ICD-10-CM | POA: Diagnosis not present

## 2014-10-09 DIAGNOSIS — M6281 Muscle weakness (generalized): Secondary | ICD-10-CM | POA: Diagnosis not present

## 2014-10-09 DIAGNOSIS — R262 Difficulty in walking, not elsewhere classified: Secondary | ICD-10-CM | POA: Diagnosis not present

## 2014-10-09 DIAGNOSIS — R41841 Cognitive communication deficit: Secondary | ICD-10-CM | POA: Diagnosis not present

## 2014-10-09 DIAGNOSIS — S7291XS Unspecified fracture of right femur, sequela: Secondary | ICD-10-CM | POA: Diagnosis not present

## 2014-10-09 DIAGNOSIS — Z9181 History of falling: Secondary | ICD-10-CM | POA: Diagnosis not present

## 2014-10-09 DIAGNOSIS — F0150 Vascular dementia without behavioral disturbance: Secondary | ICD-10-CM | POA: Diagnosis not present

## 2014-10-10 DIAGNOSIS — Z9181 History of falling: Secondary | ICD-10-CM | POA: Diagnosis not present

## 2014-10-10 DIAGNOSIS — S7291XS Unspecified fracture of right femur, sequela: Secondary | ICD-10-CM | POA: Diagnosis not present

## 2014-10-10 DIAGNOSIS — F0150 Vascular dementia without behavioral disturbance: Secondary | ICD-10-CM | POA: Diagnosis not present

## 2014-10-10 DIAGNOSIS — R41841 Cognitive communication deficit: Secondary | ICD-10-CM | POA: Diagnosis not present

## 2014-10-10 DIAGNOSIS — R262 Difficulty in walking, not elsewhere classified: Secondary | ICD-10-CM | POA: Diagnosis not present

## 2014-10-10 DIAGNOSIS — M6281 Muscle weakness (generalized): Secondary | ICD-10-CM | POA: Diagnosis not present

## 2014-10-14 DIAGNOSIS — Z9181 History of falling: Secondary | ICD-10-CM | POA: Diagnosis not present

## 2014-10-14 DIAGNOSIS — R41841 Cognitive communication deficit: Secondary | ICD-10-CM | POA: Diagnosis not present

## 2014-10-14 DIAGNOSIS — M6281 Muscle weakness (generalized): Secondary | ICD-10-CM | POA: Diagnosis not present

## 2014-10-14 DIAGNOSIS — F0150 Vascular dementia without behavioral disturbance: Secondary | ICD-10-CM | POA: Diagnosis not present

## 2014-10-14 DIAGNOSIS — S7291XS Unspecified fracture of right femur, sequela: Secondary | ICD-10-CM | POA: Diagnosis not present

## 2014-10-14 DIAGNOSIS — R262 Difficulty in walking, not elsewhere classified: Secondary | ICD-10-CM | POA: Diagnosis not present

## 2014-10-15 DIAGNOSIS — R41841 Cognitive communication deficit: Secondary | ICD-10-CM | POA: Diagnosis not present

## 2014-10-15 DIAGNOSIS — F0150 Vascular dementia without behavioral disturbance: Secondary | ICD-10-CM | POA: Diagnosis not present

## 2014-10-15 DIAGNOSIS — S7291XS Unspecified fracture of right femur, sequela: Secondary | ICD-10-CM | POA: Diagnosis not present

## 2014-10-15 DIAGNOSIS — R262 Difficulty in walking, not elsewhere classified: Secondary | ICD-10-CM | POA: Diagnosis not present

## 2014-10-15 DIAGNOSIS — Z9181 History of falling: Secondary | ICD-10-CM | POA: Diagnosis not present

## 2014-10-15 DIAGNOSIS — M6281 Muscle weakness (generalized): Secondary | ICD-10-CM | POA: Diagnosis not present

## 2014-10-16 DIAGNOSIS — M6281 Muscle weakness (generalized): Secondary | ICD-10-CM | POA: Diagnosis not present

## 2014-10-16 DIAGNOSIS — S7291XS Unspecified fracture of right femur, sequela: Secondary | ICD-10-CM | POA: Diagnosis not present

## 2014-10-16 DIAGNOSIS — Z9181 History of falling: Secondary | ICD-10-CM | POA: Diagnosis not present

## 2014-10-16 DIAGNOSIS — F0150 Vascular dementia without behavioral disturbance: Secondary | ICD-10-CM | POA: Diagnosis not present

## 2014-10-16 DIAGNOSIS — R41841 Cognitive communication deficit: Secondary | ICD-10-CM | POA: Diagnosis not present

## 2014-10-16 DIAGNOSIS — R262 Difficulty in walking, not elsewhere classified: Secondary | ICD-10-CM | POA: Diagnosis not present

## 2014-10-17 DIAGNOSIS — R262 Difficulty in walking, not elsewhere classified: Secondary | ICD-10-CM | POA: Diagnosis not present

## 2014-10-17 DIAGNOSIS — F0150 Vascular dementia without behavioral disturbance: Secondary | ICD-10-CM | POA: Diagnosis not present

## 2014-10-17 DIAGNOSIS — M6281 Muscle weakness (generalized): Secondary | ICD-10-CM | POA: Diagnosis not present

## 2014-10-17 DIAGNOSIS — S7291XS Unspecified fracture of right femur, sequela: Secondary | ICD-10-CM | POA: Diagnosis not present

## 2014-10-17 DIAGNOSIS — Z9181 History of falling: Secondary | ICD-10-CM | POA: Diagnosis not present

## 2014-10-17 DIAGNOSIS — R41841 Cognitive communication deficit: Secondary | ICD-10-CM | POA: Diagnosis not present

## 2014-10-20 DIAGNOSIS — Z9181 History of falling: Secondary | ICD-10-CM | POA: Diagnosis not present

## 2014-10-20 DIAGNOSIS — M6281 Muscle weakness (generalized): Secondary | ICD-10-CM | POA: Diagnosis not present

## 2014-10-20 DIAGNOSIS — R262 Difficulty in walking, not elsewhere classified: Secondary | ICD-10-CM | POA: Diagnosis not present

## 2014-10-20 DIAGNOSIS — R41841 Cognitive communication deficit: Secondary | ICD-10-CM | POA: Diagnosis not present

## 2014-10-20 DIAGNOSIS — F0150 Vascular dementia without behavioral disturbance: Secondary | ICD-10-CM | POA: Diagnosis not present

## 2014-10-20 DIAGNOSIS — S7291XS Unspecified fracture of right femur, sequela: Secondary | ICD-10-CM | POA: Diagnosis not present

## 2014-10-21 DIAGNOSIS — S7291XS Unspecified fracture of right femur, sequela: Secondary | ICD-10-CM | POA: Diagnosis not present

## 2014-10-21 DIAGNOSIS — S2241XA Multiple fractures of ribs, right side, initial encounter for closed fracture: Secondary | ICD-10-CM | POA: Diagnosis not present

## 2014-10-21 DIAGNOSIS — M6281 Muscle weakness (generalized): Secondary | ICD-10-CM | POA: Diagnosis not present

## 2014-10-21 DIAGNOSIS — F0150 Vascular dementia without behavioral disturbance: Secondary | ICD-10-CM | POA: Diagnosis not present

## 2014-10-21 DIAGNOSIS — Z9181 History of falling: Secondary | ICD-10-CM | POA: Diagnosis not present

## 2014-10-21 DIAGNOSIS — R41841 Cognitive communication deficit: Secondary | ICD-10-CM | POA: Diagnosis not present

## 2014-10-21 DIAGNOSIS — R262 Difficulty in walking, not elsewhere classified: Secondary | ICD-10-CM | POA: Diagnosis not present

## 2014-10-23 DIAGNOSIS — Z9181 History of falling: Secondary | ICD-10-CM | POA: Diagnosis not present

## 2014-10-23 DIAGNOSIS — R262 Difficulty in walking, not elsewhere classified: Secondary | ICD-10-CM | POA: Diagnosis not present

## 2014-10-23 DIAGNOSIS — M6281 Muscle weakness (generalized): Secondary | ICD-10-CM | POA: Diagnosis not present

## 2014-10-23 DIAGNOSIS — R41841 Cognitive communication deficit: Secondary | ICD-10-CM | POA: Diagnosis not present

## 2014-10-23 DIAGNOSIS — S7291XS Unspecified fracture of right femur, sequela: Secondary | ICD-10-CM | POA: Diagnosis not present

## 2014-10-23 DIAGNOSIS — F0150 Vascular dementia without behavioral disturbance: Secondary | ICD-10-CM | POA: Diagnosis not present

## 2014-10-24 DIAGNOSIS — S7291XS Unspecified fracture of right femur, sequela: Secondary | ICD-10-CM | POA: Diagnosis not present

## 2014-10-24 DIAGNOSIS — F0150 Vascular dementia without behavioral disturbance: Secondary | ICD-10-CM | POA: Diagnosis not present

## 2014-10-24 DIAGNOSIS — M6281 Muscle weakness (generalized): Secondary | ICD-10-CM | POA: Diagnosis not present

## 2014-10-24 DIAGNOSIS — Z9181 History of falling: Secondary | ICD-10-CM | POA: Diagnosis not present

## 2014-10-24 DIAGNOSIS — R41841 Cognitive communication deficit: Secondary | ICD-10-CM | POA: Diagnosis not present

## 2014-10-24 DIAGNOSIS — R262 Difficulty in walking, not elsewhere classified: Secondary | ICD-10-CM | POA: Diagnosis not present

## 2014-10-28 DIAGNOSIS — M6281 Muscle weakness (generalized): Secondary | ICD-10-CM | POA: Diagnosis not present

## 2014-10-28 DIAGNOSIS — R41841 Cognitive communication deficit: Secondary | ICD-10-CM | POA: Diagnosis not present

## 2014-10-28 DIAGNOSIS — S7291XS Unspecified fracture of right femur, sequela: Secondary | ICD-10-CM | POA: Diagnosis not present

## 2014-10-28 DIAGNOSIS — F0150 Vascular dementia without behavioral disturbance: Secondary | ICD-10-CM | POA: Diagnosis not present

## 2014-10-28 DIAGNOSIS — Z9181 History of falling: Secondary | ICD-10-CM | POA: Diagnosis not present

## 2014-10-28 DIAGNOSIS — R262 Difficulty in walking, not elsewhere classified: Secondary | ICD-10-CM | POA: Diagnosis not present

## 2014-10-29 DIAGNOSIS — S7291XS Unspecified fracture of right femur, sequela: Secondary | ICD-10-CM | POA: Diagnosis not present

## 2014-10-29 DIAGNOSIS — R41841 Cognitive communication deficit: Secondary | ICD-10-CM | POA: Diagnosis not present

## 2014-10-29 DIAGNOSIS — F0150 Vascular dementia without behavioral disturbance: Secondary | ICD-10-CM | POA: Diagnosis not present

## 2014-10-29 DIAGNOSIS — R262 Difficulty in walking, not elsewhere classified: Secondary | ICD-10-CM | POA: Diagnosis not present

## 2014-10-29 DIAGNOSIS — M6281 Muscle weakness (generalized): Secondary | ICD-10-CM | POA: Diagnosis not present

## 2014-10-29 DIAGNOSIS — Z9181 History of falling: Secondary | ICD-10-CM | POA: Diagnosis not present

## 2014-10-30 DIAGNOSIS — S7291XS Unspecified fracture of right femur, sequela: Secondary | ICD-10-CM | POA: Diagnosis not present

## 2014-10-30 DIAGNOSIS — R41841 Cognitive communication deficit: Secondary | ICD-10-CM | POA: Diagnosis not present

## 2014-10-30 DIAGNOSIS — M6281 Muscle weakness (generalized): Secondary | ICD-10-CM | POA: Diagnosis not present

## 2014-10-30 DIAGNOSIS — F0150 Vascular dementia without behavioral disturbance: Secondary | ICD-10-CM | POA: Diagnosis not present

## 2014-10-30 DIAGNOSIS — R262 Difficulty in walking, not elsewhere classified: Secondary | ICD-10-CM | POA: Diagnosis not present

## 2014-10-30 DIAGNOSIS — Z9181 History of falling: Secondary | ICD-10-CM | POA: Diagnosis not present

## 2014-10-31 DIAGNOSIS — F0150 Vascular dementia without behavioral disturbance: Secondary | ICD-10-CM | POA: Diagnosis not present

## 2014-10-31 DIAGNOSIS — M6281 Muscle weakness (generalized): Secondary | ICD-10-CM | POA: Diagnosis not present

## 2014-10-31 DIAGNOSIS — Z9181 History of falling: Secondary | ICD-10-CM | POA: Diagnosis not present

## 2014-10-31 DIAGNOSIS — S7291XS Unspecified fracture of right femur, sequela: Secondary | ICD-10-CM | POA: Diagnosis not present

## 2014-10-31 DIAGNOSIS — R262 Difficulty in walking, not elsewhere classified: Secondary | ICD-10-CM | POA: Diagnosis not present

## 2014-10-31 DIAGNOSIS — R41841 Cognitive communication deficit: Secondary | ICD-10-CM | POA: Diagnosis not present

## 2014-11-03 DIAGNOSIS — R262 Difficulty in walking, not elsewhere classified: Secondary | ICD-10-CM | POA: Diagnosis not present

## 2014-11-03 DIAGNOSIS — Z9181 History of falling: Secondary | ICD-10-CM | POA: Diagnosis not present

## 2014-11-03 DIAGNOSIS — F0150 Vascular dementia without behavioral disturbance: Secondary | ICD-10-CM | POA: Diagnosis not present

## 2014-11-03 DIAGNOSIS — S7291XS Unspecified fracture of right femur, sequela: Secondary | ICD-10-CM | POA: Diagnosis not present

## 2014-11-03 DIAGNOSIS — M6281 Muscle weakness (generalized): Secondary | ICD-10-CM | POA: Diagnosis not present

## 2014-11-03 DIAGNOSIS — R41841 Cognitive communication deficit: Secondary | ICD-10-CM | POA: Diagnosis not present

## 2014-11-04 DIAGNOSIS — R262 Difficulty in walking, not elsewhere classified: Secondary | ICD-10-CM | POA: Diagnosis not present

## 2014-11-04 DIAGNOSIS — M6281 Muscle weakness (generalized): Secondary | ICD-10-CM | POA: Diagnosis not present

## 2014-11-04 DIAGNOSIS — R41841 Cognitive communication deficit: Secondary | ICD-10-CM | POA: Diagnosis not present

## 2014-11-04 DIAGNOSIS — S7291XS Unspecified fracture of right femur, sequela: Secondary | ICD-10-CM | POA: Diagnosis not present

## 2014-11-04 DIAGNOSIS — Z9181 History of falling: Secondary | ICD-10-CM | POA: Diagnosis not present

## 2014-11-04 DIAGNOSIS — F0150 Vascular dementia without behavioral disturbance: Secondary | ICD-10-CM | POA: Diagnosis not present

## 2014-11-05 DIAGNOSIS — M6281 Muscle weakness (generalized): Secondary | ICD-10-CM | POA: Diagnosis not present

## 2014-11-05 DIAGNOSIS — Z9181 History of falling: Secondary | ICD-10-CM | POA: Diagnosis not present

## 2014-11-05 DIAGNOSIS — R262 Difficulty in walking, not elsewhere classified: Secondary | ICD-10-CM | POA: Diagnosis not present

## 2014-11-05 DIAGNOSIS — R41841 Cognitive communication deficit: Secondary | ICD-10-CM | POA: Diagnosis not present

## 2014-11-05 DIAGNOSIS — F0150 Vascular dementia without behavioral disturbance: Secondary | ICD-10-CM | POA: Diagnosis not present

## 2014-11-05 DIAGNOSIS — S7291XS Unspecified fracture of right femur, sequela: Secondary | ICD-10-CM | POA: Diagnosis not present

## 2014-11-07 DIAGNOSIS — F0150 Vascular dementia without behavioral disturbance: Secondary | ICD-10-CM | POA: Diagnosis not present

## 2014-11-07 DIAGNOSIS — S7291XS Unspecified fracture of right femur, sequela: Secondary | ICD-10-CM | POA: Diagnosis not present

## 2014-11-07 DIAGNOSIS — Z9181 History of falling: Secondary | ICD-10-CM | POA: Diagnosis not present

## 2014-11-07 DIAGNOSIS — M6281 Muscle weakness (generalized): Secondary | ICD-10-CM | POA: Diagnosis not present

## 2014-11-07 DIAGNOSIS — R41841 Cognitive communication deficit: Secondary | ICD-10-CM | POA: Diagnosis not present

## 2014-11-07 DIAGNOSIS — R262 Difficulty in walking, not elsewhere classified: Secondary | ICD-10-CM | POA: Diagnosis not present

## 2014-11-10 DIAGNOSIS — Z9181 History of falling: Secondary | ICD-10-CM | POA: Diagnosis not present

## 2014-11-10 DIAGNOSIS — S7291XS Unspecified fracture of right femur, sequela: Secondary | ICD-10-CM | POA: Diagnosis not present

## 2014-11-10 DIAGNOSIS — H04123 Dry eye syndrome of bilateral lacrimal glands: Secondary | ICD-10-CM | POA: Diagnosis not present

## 2014-11-10 DIAGNOSIS — M6281 Muscle weakness (generalized): Secondary | ICD-10-CM | POA: Diagnosis not present

## 2014-11-10 DIAGNOSIS — F0150 Vascular dementia without behavioral disturbance: Secondary | ICD-10-CM | POA: Diagnosis not present

## 2014-11-10 DIAGNOSIS — R262 Difficulty in walking, not elsewhere classified: Secondary | ICD-10-CM | POA: Diagnosis not present

## 2014-11-10 DIAGNOSIS — H3531 Nonexudative age-related macular degeneration: Secondary | ICD-10-CM | POA: Diagnosis not present

## 2014-11-10 DIAGNOSIS — H3532 Exudative age-related macular degeneration: Secondary | ICD-10-CM | POA: Diagnosis not present

## 2014-11-10 DIAGNOSIS — R41841 Cognitive communication deficit: Secondary | ICD-10-CM | POA: Diagnosis not present

## 2014-11-10 DIAGNOSIS — H3500 Unspecified background retinopathy: Secondary | ICD-10-CM | POA: Diagnosis not present

## 2014-11-11 DIAGNOSIS — H3531 Nonexudative age-related macular degeneration: Secondary | ICD-10-CM | POA: Diagnosis not present

## 2014-11-11 DIAGNOSIS — Z9181 History of falling: Secondary | ICD-10-CM | POA: Diagnosis not present

## 2014-11-11 DIAGNOSIS — H3532 Exudative age-related macular degeneration: Secondary | ICD-10-CM | POA: Diagnosis not present

## 2014-11-11 DIAGNOSIS — M6281 Muscle weakness (generalized): Secondary | ICD-10-CM | POA: Diagnosis not present

## 2014-11-11 DIAGNOSIS — S7291XS Unspecified fracture of right femur, sequela: Secondary | ICD-10-CM | POA: Diagnosis not present

## 2014-11-11 DIAGNOSIS — R262 Difficulty in walking, not elsewhere classified: Secondary | ICD-10-CM | POA: Diagnosis not present

## 2014-11-11 DIAGNOSIS — R41841 Cognitive communication deficit: Secondary | ICD-10-CM | POA: Diagnosis not present

## 2014-11-11 DIAGNOSIS — F0150 Vascular dementia without behavioral disturbance: Secondary | ICD-10-CM | POA: Diagnosis not present

## 2014-11-12 DIAGNOSIS — S7291XS Unspecified fracture of right femur, sequela: Secondary | ICD-10-CM | POA: Diagnosis not present

## 2014-11-12 DIAGNOSIS — F0150 Vascular dementia without behavioral disturbance: Secondary | ICD-10-CM | POA: Diagnosis not present

## 2014-11-12 DIAGNOSIS — R41841 Cognitive communication deficit: Secondary | ICD-10-CM | POA: Diagnosis not present

## 2014-11-12 DIAGNOSIS — M6281 Muscle weakness (generalized): Secondary | ICD-10-CM | POA: Diagnosis not present

## 2014-11-12 DIAGNOSIS — R262 Difficulty in walking, not elsewhere classified: Secondary | ICD-10-CM | POA: Diagnosis not present

## 2014-11-12 DIAGNOSIS — Z9181 History of falling: Secondary | ICD-10-CM | POA: Diagnosis not present

## 2015-02-05 ENCOUNTER — Ambulatory Visit (INDEPENDENT_AMBULATORY_CARE_PROVIDER_SITE_OTHER): Payer: Medicare Other | Admitting: Endocrinology

## 2015-02-05 ENCOUNTER — Encounter: Payer: Self-pay | Admitting: Endocrinology

## 2015-02-05 VITALS — BP 112/72 | HR 73 | Temp 98.7°F | Resp 16 | Wt 109.0 lb

## 2015-02-05 DIAGNOSIS — E038 Other specified hypothyroidism: Secondary | ICD-10-CM | POA: Diagnosis not present

## 2015-02-05 DIAGNOSIS — E063 Autoimmune thyroiditis: Secondary | ICD-10-CM

## 2015-02-05 LAB — TSH: TSH: 0.36 u[IU]/mL (ref 0.35–4.50)

## 2015-02-05 LAB — T4, FREE: FREE T4: 1.08 ng/dL (ref 0.60–1.60)

## 2015-02-05 NOTE — Progress Notes (Signed)
Patient ID: [name], female   DOB: 04-12-1920, 81 y.o.   MRN: [id number]   Reason for Appointment:  Hypothyroidism, f/u visit   History of Present Illness:    HYPOTHYROIDISM  was first diagnosed in 1948 when she had thyroidectomy for Graves' disease   The symptoms consistent with hypothyroidism  previously were: feeling sleepy during the day despite sleeping well at night and needing naps. She also has had some dry skin but no significant weight gain or cold intolerance. She has had some hair loss which is not new  She had been referred here because of inadequate control of her hypothyroidism despite increasing her Synthroid doses progressively  Since she was getting iron and calcium tablets in the morning with her Synthroid timing of these was changed on visit in 02/2013.  She is now taking her Synthroid 7 AM She is taking her calcium supplement at lunch and iron at bedtime  This was again verified on her MAR today Her Synthroid dose has been adjusted periodically since her initial visit and appears to be requiring progressively less doses now  On her last visit in 6/15  her Synthroid dosage was reduced and subsequently in 9/15 with TSH mildly low her Synthroid was changed to 250 g on 6 days a week and 125 mg on the seventh day.  The dose was continued unchanged in 12/15  She is now here for follow-up  She continues to feel fairly well although difficult to get a good history because of her dementia. She is reportedly not getting excessive sleepiness or fatigue, she says she will take a nap during the day but this is because she gets bored Does not complain of cold intolerance, she does get coldness in her feet  Wt Readings from Last 3 Encounters:  02/05/15 109 lb (49.442 kg)  08/11/14 111 lb (50.349 kg)  08/07/14 111 lb (50.349 kg)   Previous TSH levels: on 02/15/13;  result was 21.34 TSH in 9/15 was 0.265  Lab Results  Component Value Date   TSH 0.36 02/05/2015     TSH 0.85 08/07/2014   FREET4 1.08 02/05/2015   FREET4 1.05 08/07/2014          Past Medical History  Diagnosis Date  . Thyroid disease   . Hypertension   . Dementia   . Anemia   . Gastric ulcer     gi bleed secondary  . Renal disorder   . Vertigo     Past Surgical History  Procedure Laterality Date  . Thyroid surgery    . Mastectomy    . Carpal tunnel release    . Abdominal hysterectomy    . Femur im nail  05/13/2012    Procedure: INTRAMEDULLARY (IM) NAIL FEMORAL;  Surgeon: Senaida Lange, MD;  Location: [facility] OR;  Service: Orthopedics;  Laterality: Right;  . Fracture surgery      Family History  Problem Relation Age of Onset  . Thyroid disease Neg Hx    Social History:  reports that she has never smoked. She does not have any smokeless tobacco history on file. She reports that she does not drink alcohol or use illicit drugs.  Allergies:  Allergies  Allergen Reactions  . Amoxicillin-Pot Clavulanate     REACTION: UNKNOWN REACTION  . Cephalexin     REACTION: UNKNOWN REACTION  . Nsaids       Medication List       This list is accurate as of: 02/05/15 11:59  PM.  Always use your most recent med list.               acetaminophen 325 MG tablet  Commonly known as:  TYLENOL  Take 650 mg by mouth every 4 (four) hours as needed for mild pain or fever.     acetaminophen 500 MG tablet  Commonly known as:  TYLENOL  Take 1,000 mg by mouth at bedtime.     ALPRAZolam 0.25 MG tablet  Commonly known as:  XANAX  Take 1 tablet (0.25 mg total) by mouth 2 (two) times daily.     Biotin 300 MCG Tabs  Take 300 mcg by mouth daily.     calcium-vitamin D 500-200 MG-UNIT per tablet  Commonly known as:  OSCAL WITH D  Take 1 tablet by mouth daily.     DAILY VITE Tabs  Take 1 tablet by mouth daily.     donepezil 10 MG tablet  Commonly known as:  ARICEPT  Take 10 mg by mouth daily.     escitalopram 10 MG tablet  Commonly known as:  LEXAPRO  Take 10 mg by mouth daily.      feeding supplement (ENSURE COMPLETE) Liqd  Take 237 mLs by mouth as needed (for nutritional supplementation).     ferrous sulfate 325 (65 FE) MG tablet  Take 1 tablet (325 mg total) by mouth 3 (three) times daily with meals.     HYDROcodone-acetaminophen 5-325 MG per tablet  Commonly known as:  NORCO/VICODIN  Take 0.5 tablets by mouth every 4 (four) hours as needed for moderate pain.     loperamide 2 MG tablet  Commonly known as:  IMODIUM A-D  Take 2 mg by mouth 2 (two) times daily.     loratadine 10 MG tablet  Commonly known as:  CLARITIN  Take 10 mg by mouth daily.     losartan 50 MG tablet  Commonly known as:  COZAAR  Take 50 mg by mouth daily.     Lutein 6 MG Caps  Take 1 capsule by mouth daily.     meclizine 25 MG tablet  Commonly known as:  ANTIVERT  Take 25 mg by mouth 3 (three) times daily as needed for dizziness.     memantine 10 MG tablet  Commonly known as:  NAMENDA  Take 10 mg by mouth 2 (two) times daily.     omeprazole 20 MG capsule  Commonly known as:  PRILOSEC  Take 20 mg by mouth daily.     pantoprazole 40 MG tablet  Commonly known as:  PROTONIX  Take 40 mg by mouth daily.     PRESERVISION AREDS 2 Caps  Take 1 capsule by mouth 2 (two) times daily.     senna 8.6 MG Tabs tablet  Commonly known as:  SENOKOT  Take 1 tablet (8.6 mg total) by mouth 2 (two) times daily.     SYNTHROID 125 MCG tablet  Generic drug:  levothyroxine  Take 125-250 mcg by mouth daily before breakfast. Patient takes Mon-Sat. And on Sunday     SYNTHROID 125 MCG tablet  Generic drug:  levothyroxine  TAKE 2 TABLETS ( ) BY MOUTH EACH MORNING.     SYSTANE OP  Apply 1 drop to eye 4 (four) times daily.     traMADol 50 MG tablet  Commonly known as:  ULTRAM  Take 1 tablet by mouth every 6 (six) hours as needed for moderate pain.        Review of  Systems:            Patient has long-standing history of dementia Has positive history of depression  treated with Celexa   History of Iron deficiency anemia        Examination:    BP 112/72 mmHg  Pulse 73  Temp(Src) 98.7 F (37.1 C) (Oral)  Resp 16  Wt 109 lb (49.442 kg)  SpO2 87%   General Appearance: pleasant, alert, conversing pleasantly, looks well          No puffiness of the face or eyes         Neck: The thyroid is nonpalpable.  Deep tendon reflexes : at biceps are normal.       skin appears normal.  Assessments .   Postsurgical Hypothyroidism, long-standing  She has been symptomatically better adequately supplementing her thyroid since 2014 when she was first seen She also benefited significantly with changing her Synthroid and OTC supplement timing to avoid interaction with her calcium and iron  Currently she appears to be doing well and clinically looks euthyroid Currently taking 250 g daily except 125 g once a week  To decide dosage of Synthroid based on TSH level today, followup in 6 months unless another dose changes needed  Lige Lakeman 02/06/2015, 9:49 AM   Addendum: TSH low normal, will change her dose to 224 g daily

## 2015-02-06 NOTE — Progress Notes (Signed)
Quick Note:  TSH is low normal, to change dosage to 112 g, 2 tablets daily. Orders to be faxed to nursing home  ______

## 2015-03-23 DIAGNOSIS — E039 Hypothyroidism, unspecified: Secondary | ICD-10-CM | POA: Diagnosis not present

## 2015-05-14 DIAGNOSIS — H3531 Nonexudative age-related macular degeneration: Secondary | ICD-10-CM | POA: Diagnosis not present

## 2015-05-14 DIAGNOSIS — H3532 Exudative age-related macular degeneration: Secondary | ICD-10-CM | POA: Diagnosis not present

## 2015-05-19 DIAGNOSIS — Z23 Encounter for immunization: Secondary | ICD-10-CM | POA: Diagnosis not present

## 2015-06-06 ENCOUNTER — Inpatient Hospital Stay (HOSPITAL_COMMUNITY)
Admission: EM | Admit: 2015-06-06 | Discharge: 2015-06-08 | DRG: 193 | Disposition: A | Discharge status: Nursing Home (Includes ICF) | Payer: Medicare Other | Attending: Internal Medicine | Admitting: Internal Medicine

## 2015-06-06 ENCOUNTER — Emergency Department (HOSPITAL_COMMUNITY): Payer: Medicare Other

## 2015-06-06 ENCOUNTER — Encounter (HOSPITAL_COMMUNITY): Payer: Self-pay | Admitting: *Deleted

## 2015-06-06 DIAGNOSIS — R531 Weakness: Secondary | ICD-10-CM | POA: Diagnosis not present

## 2015-06-06 DIAGNOSIS — F0390 Unspecified dementia without behavioral disturbance: Secondary | ICD-10-CM | POA: Diagnosis present

## 2015-06-06 DIAGNOSIS — N183 Chronic kidney disease, stage 3 unspecified: Secondary | ICD-10-CM | POA: Diagnosis present

## 2015-06-06 DIAGNOSIS — J449 Chronic obstructive pulmonary disease, unspecified: Secondary | ICD-10-CM | POA: Diagnosis not present

## 2015-06-06 DIAGNOSIS — I6789 Other cerebrovascular disease: Secondary | ICD-10-CM | POA: Diagnosis not present

## 2015-06-06 DIAGNOSIS — Z66 Do not resuscitate: Secondary | ICD-10-CM | POA: Diagnosis present

## 2015-06-06 DIAGNOSIS — K219 Gastro-esophageal reflux disease without esophagitis: Secondary | ICD-10-CM | POA: Diagnosis present

## 2015-06-06 DIAGNOSIS — Y95 Nosocomial condition: Secondary | ICD-10-CM | POA: Diagnosis present

## 2015-06-06 DIAGNOSIS — E039 Hypothyroidism, unspecified: Secondary | ICD-10-CM | POA: Diagnosis present

## 2015-06-06 DIAGNOSIS — D649 Anemia, unspecified: Secondary | ICD-10-CM | POA: Diagnosis present

## 2015-06-06 DIAGNOSIS — R410 Disorientation, unspecified: Secondary | ICD-10-CM | POA: Diagnosis not present

## 2015-06-06 DIAGNOSIS — R269 Unspecified abnormalities of gait and mobility: Secondary | ICD-10-CM | POA: Diagnosis not present

## 2015-06-06 DIAGNOSIS — R05 Cough: Secondary | ICD-10-CM | POA: Diagnosis not present

## 2015-06-06 DIAGNOSIS — I129 Hypertensive chronic kidney disease with stage 1 through stage 4 chronic kidney disease, or unspecified chronic kidney disease: Secondary | ICD-10-CM | POA: Diagnosis present

## 2015-06-06 DIAGNOSIS — J189 Pneumonia, unspecified organism: Principal | ICD-10-CM | POA: Diagnosis present

## 2015-06-06 DIAGNOSIS — J9601 Acute respiratory failure with hypoxia: Secondary | ICD-10-CM | POA: Diagnosis not present

## 2015-06-06 DIAGNOSIS — I10 Essential (primary) hypertension: Secondary | ICD-10-CM | POA: Diagnosis present

## 2015-06-06 DIAGNOSIS — F4489 Other dissociative and conversion disorders: Secondary | ICD-10-CM | POA: Diagnosis not present

## 2015-06-06 HISTORY — DX: Pneumonia, unspecified organism: J18.9

## 2015-06-06 LAB — URINALYSIS, ROUTINE W REFLEX MICROSCOPIC
BILIRUBIN URINE: NEGATIVE
Glucose, UA: NEGATIVE mg/dL
Hgb urine dipstick: NEGATIVE
Ketones, ur: NEGATIVE mg/dL
Leukocytes, UA: NEGATIVE
NITRITE: NEGATIVE
PROTEIN: NEGATIVE mg/dL
Specific Gravity, Urine: 1.025 (ref 1.005–1.030)
UROBILINOGEN UA: 1 mg/dL (ref 0.0–1.0)
pH: 6 (ref 5.0–8.0)

## 2015-06-06 LAB — COMPREHENSIVE METABOLIC PANEL
ALK PHOS: 81 U/L (ref 38–126)
ALT: 16 U/L (ref 14–54)
AST: 23 U/L (ref 15–41)
Albumin: 3.8 g/dL (ref 3.5–5.0)
Anion gap: 8 (ref 5–15)
BILIRUBIN TOTAL: 0.8 mg/dL (ref 0.3–1.2)
BUN: 34 mg/dL — ABNORMAL HIGH (ref 6–20)
CHLORIDE: 102 mmol/L (ref 101–111)
CO2: 26 mmol/L (ref 22–32)
CREATININE: 1.32 mg/dL — AB (ref 0.44–1.00)
Calcium: 8.8 mg/dL — ABNORMAL LOW (ref 8.9–10.3)
GFR calc non Af Amer: 34 mL/min — ABNORMAL LOW (ref >60)
GFR, EST AFRICAN AMERICAN: 39 mL/min — AB (ref >60)
GLUCOSE: 119 mg/dL — AB (ref 65–99)
POTASSIUM: 4.4 mmol/L (ref 3.5–5.1)
SODIUM: 136 mmol/L (ref 135–145)
Total Protein: 6.8 g/dL (ref 6.5–8.1)

## 2015-06-06 LAB — BRAIN NATRIURETIC PEPTIDE: B NATRIURETIC PEPTIDE 5: 288 pg/mL — AB (ref 0.0–100.0)

## 2015-06-06 LAB — I-STAT CG4 LACTIC ACID, ED: Lactic Acid, Venous: 0.77 mmol/L (ref 0.5–2.0)

## 2015-06-06 LAB — CBC WITH DIFFERENTIAL/PLATELET
BASOS PCT: 0 %
Basophils Absolute: 0 10*3/uL (ref 0.0–0.1)
Eosinophils Absolute: 0 10*3/uL (ref 0.0–0.7)
Eosinophils Relative: 0 %
HEMATOCRIT: 35.1 % — AB (ref 36.0–46.0)
HEMOGLOBIN: 11.6 g/dL — AB (ref 12.0–15.0)
LYMPHS ABS: 1.2 10*3/uL (ref 0.7–4.0)
LYMPHS PCT: 12 %
MCH: 32.1 pg (ref 26.0–34.0)
MCHC: 33 g/dL (ref 30.0–36.0)
MCV: 97.2 fL (ref 78.0–100.0)
MONOS PCT: 13 %
Monocytes Absolute: 1.3 10*3/uL — ABNORMAL HIGH (ref 0.1–1.0)
NEUTROS ABS: 7.4 10*3/uL (ref 1.7–7.7)
NEUTROS PCT: 75 %
Platelets: 181 10*3/uL (ref 150–400)
RBC: 3.61 MIL/uL — ABNORMAL LOW (ref 3.87–5.11)
RDW: 12.9 % (ref 11.5–15.5)
WBC: 9.9 10*3/uL (ref 4.0–10.5)

## 2015-06-06 LAB — TROPONIN I: Troponin I: 0.05 ng/mL — ABNORMAL HIGH (ref <0.031)

## 2015-06-06 MED ORDER — LEVOTHYROXINE SODIUM 112 MCG PO TABS
224.0000 ug | ORAL_TABLET | Freq: Every day | ORAL | Status: DC
  Administered 2015-06-07: 224 ug via ORAL
  Filled 2015-06-06 (×2): qty 2

## 2015-06-06 MED ORDER — LUTEIN 6 MG PO CAPS: 1.0000 | ORAL_CAPSULE | Freq: Every day | ORAL | Status: DC

## 2015-06-06 MED ORDER — ADULT MULTIVITAMIN W/MINERALS CH
1.0000 | ORAL_TABLET | Freq: Every day | ORAL | Status: DC
  Administered 2015-06-07: 1 via ORAL
  Filled 2015-06-06 (×4): qty 1

## 2015-06-06 MED ORDER — ACETAMINOPHEN 325 MG PO TABS
650.0000 mg | ORAL_TABLET | Freq: Once | ORAL | Status: AC
  Administered 2015-06-06: 650 mg via ORAL
  Filled 2015-06-06: qty 2

## 2015-06-06 MED ORDER — VANCOMYCIN HCL IN DEXTROSE 1-5 GM/200ML-% IV SOLN
1000.0000 mg | Freq: Once | INTRAVENOUS | Status: AC
  Administered 2015-06-06: 1000 mg via INTRAVENOUS
  Filled 2015-06-06: qty 200

## 2015-06-06 MED ORDER — ACETAMINOPHEN 325 MG PO TABS
650.0000 mg | ORAL_TABLET | ORAL | Status: DC | PRN
  Administered 2015-06-07: 650 mg via ORAL
  Filled 2015-06-06: qty 2

## 2015-06-06 MED ORDER — DEXTROSE 5 % IV SOLN: 2.0000 g | Freq: Three times a day (TID) | INTRAVENOUS | Status: DC

## 2015-06-06 MED ORDER — ONDANSETRON HCL 4 MG PO TABS: 4.0000 mg | ORAL_TABLET | Freq: Four times a day (QID) | ORAL | Status: DC | PRN

## 2015-06-06 MED ORDER — SODIUM CHLORIDE 0.9 % IV BOLUS (SEPSIS)
1000.0000 mL | Freq: Once | INTRAVENOUS | Status: AC
  Administered 2015-06-06: 1000 mL via INTRAVENOUS

## 2015-06-06 MED ORDER — ONDANSETRON HCL 4 MG/2ML IJ SOLN: 4.0000 mg | Freq: Four times a day (QID) | INTRAMUSCULAR | Status: DC | PRN

## 2015-06-06 MED ORDER — MEMANTINE HCL 10 MG PO TABS
10.0000 mg | ORAL_TABLET | Freq: Two times a day (BID) | ORAL | Status: DC
  Administered 2015-06-07 (×3): 10 mg via ORAL
  Filled 2015-06-06 (×4): qty 1

## 2015-06-06 MED ORDER — ESCITALOPRAM OXALATE 10 MG PO TABS
10.0000 mg | ORAL_TABLET | Freq: Every day | ORAL | Status: DC
  Administered 2015-06-07: 10 mg via ORAL
  Filled 2015-06-06 (×2): qty 1

## 2015-06-06 MED ORDER — PANTOPRAZOLE SODIUM 40 MG PO TBEC
40.0000 mg | DELAYED_RELEASE_TABLET | Freq: Every day | ORAL | Status: DC
Start: 2015-06-07 — End: 2015-06-08
  Administered 2015-06-07: 40 mg via ORAL
  Filled 2015-06-06 (×2): qty 1

## 2015-06-06 MED ORDER — LOSARTAN POTASSIUM 50 MG PO TABS
50.0000 mg | ORAL_TABLET | Freq: Every day | ORAL | Status: DC
  Administered 2015-06-07: 50 mg via ORAL
  Filled 2015-06-06 (×2): qty 1

## 2015-06-06 MED ORDER — ACETAMINOPHEN 500 MG PO TABS
1000.0000 mg | ORAL_TABLET | Freq: Every day | ORAL | Status: DC
  Administered 2015-06-07 (×2): 1000 mg via ORAL
  Filled 2015-06-06 (×2): qty 2

## 2015-06-06 MED ORDER — FERROUS SULFATE 325 (65 FE) MG PO TABS
325.0000 mg | ORAL_TABLET | Freq: Every day | ORAL | Status: DC
  Administered 2015-06-07 (×2): 325 mg via ORAL
  Filled 2015-06-06 (×2): qty 1

## 2015-06-06 MED ORDER — HEPARIN SODIUM (PORCINE) 5000 UNIT/ML IJ SOLN
5000.0000 [IU] | Freq: Three times a day (TID) | INTRAMUSCULAR | Status: DC
Start: 2015-06-07 — End: 2015-06-08
  Administered 2015-06-07 (×4): 5000 [IU] via SUBCUTANEOUS
  Filled 2015-06-06 (×5): qty 1

## 2015-06-06 MED ORDER — VANCOMYCIN HCL IN DEXTROSE 750-5 MG/150ML-% IV SOLN
750.0000 mg | INTRAVENOUS | Status: DC
  Filled 2015-06-06: qty 150

## 2015-06-06 MED ORDER — DEXTROSE 5 % IV SOLN
2.0000 g | INTRAVENOUS | Status: DC
  Filled 2015-06-06: qty 2

## 2015-06-06 MED ORDER — DEXTROSE 5 % IV SOLN
2.0000 g | Freq: Two times a day (BID) | INTRAVENOUS | Status: DC
  Administered 2015-06-06 – 2015-06-07 (×4): 2 g via INTRAVENOUS
  Filled 2015-06-06 (×6): qty 2

## 2015-06-06 MED ORDER — DONEPEZIL HCL 5 MG PO TABS
10.0000 mg | ORAL_TABLET | Freq: Every day | ORAL | Status: DC
  Administered 2015-06-07: 10 mg via ORAL
  Filled 2015-06-06 (×2): qty 2

## 2015-06-06 NOTE — H&P (Signed)
[facility] History and Physical  Holly Sladenn P Bhola ZOX:[id number] DOB: [birth year]    PCP:   Colette RibasGOLDING, JOHN CABOT, MD   Chief Complaint:  Coughs and weakness.   HPI: [name] is an 81 y.o. female with hx of mild to moderate dementia, HTN, hypothyroidism, hx of GI bleed, anemia, SNF resident with DNR code status, brought to the ER complaining of feeling weak and having productive coughs.  She has no feever or chills.  Denied CP or SOB.  Evalaution in the ER showed CXR with new infiltrate, and no leukocytosis or fever.  She was given IV Zenaida NieceVan and Elita QuickFortaz in the ER and tolerated well.  Her electrolytes were unremarkable with Hb of 11.6, and Cr of 1.3.  Hospitalist was asked to admit her for HCAP given her advanced age.   Rewiew of Systems:  Constitutional: Negative for malaise, fever and chills. No significant weight loss or weight gain Eyes: Negative for eye pain, redness and discharge, diplopia, visual changes, or flashes of light. ENMT: Negative for ear pain, hoarseness, nasal congestion, sinus pressure and sore throat. No headaches; tinnitus, drooling, or problem swallowing. Cardiovascular: Negative for chest pain, palpitations, diaphoresis,  and peripheral edema. ; No orthopnea, PND Respiratory: Negative for hemoptysis, wheezing and stridor. No pleuritic chestpain. Gastrointestinal: Negative for nausea, vomiting, diarrhea, constipation, abdominal pain, melena, blood in stool, hematemesis, jaundice and rectal bleeding.    Genitourinary: Negative for frequency, dysuria, incontinence,flank pain and hematuria; Musculoskeletal: Negative for back pain and neck pain. Negative for swelling and trauma.;  Skin: . Negative for pruritus, rash, abrasions, bruising and skin lesion.; ulcerations Neuro: Negative for headache, lightheadedness and neck stiffness. Negative for weakness, altered level of consciousness , altered mental status, extremity weakness, burning feet, involuntary movement, seizure and  syncope.  Psych: negative for anxiety, depression, insomnia, tearfulness, panic attacks, hallucinations, paranoia, suicidal or homicidal ideation    Past Medical History  Diagnosis Date  . Thyroid disease   . Hypertension   . Dementia   . Anemia   . Gastric ulcer     gi bleed secondary  . Renal disorder   . Vertigo     Past Surgical History  Procedure Laterality Date  . Thyroid surgery    . Mastectomy    . Carpal tunnel release    . Abdominal hysterectomy    . Femur im nail  05/13/2012    Procedure: INTRAMEDULLARY (IM) NAIL FEMORAL;  Surgeon: Senaida LangeKevin M Supple, MD;  Location: [facility] OR;  Service: Orthopedics;  Laterality: Right;  . Fracture surgery      Medications:  HOME MEDS: Prior to Admission medications   Medication Sig Start Date End Date Taking? Authorizing Provider  acetaminophen (TYLENOL) 325 MG tablet Take 650 mg by mouth every 4 (four) hours as needed for mild pain or fever.   Yes Historical Provider, MD  acetaminophen (TYLENOL) 500 MG tablet Take 1,000 mg by mouth at bedtime.   Yes Historical Provider, MD  Biotin 300 MCG TABS Take 300 mcg by mouth daily.    Yes Historical Provider, MD  calcium-vitamin D (OSCAL WITH D) 500-200 MG-UNIT per tablet Take 1 tablet by mouth daily.   Yes Historical Provider, MD  donepezil (ARICEPT) 10 MG tablet Take 10 mg by mouth daily.    Yes Historical Provider, MD  escitalopram (LEXAPRO) 10 MG tablet Take 10 mg by mouth daily.   Yes Historical Provider, MD  feeding supplement (ENSURE COMPLETE) LIQD Take 237 mLs by mouth as needed (for nutritional supplementation).  05/16/12  Yes Leroy Sea, MD  ferrous sulfate 325 (65 FE) MG tablet Take 1 tablet (325 mg total) by mouth 3 (three) times daily with meals. Patient taking differently: Take 325 mg by mouth at bedtime.  05/24/12  Yes Erick Blinks, MD  levothyroxine (SYNTHROID, LEVOTHROID) 112 MCG tablet Take 224 mcg by mouth daily before breakfast.   Yes Historical Provider, MD  loperamide  (IMODIUM A-D) 2 MG tablet Take 2 mg by mouth 2 (two) times daily.   Yes Historical Provider, MD  loratadine (CLARITIN) 10 MG tablet Take 10 mg by mouth daily.   Yes Historical Provider, MD  losartan (COZAAR) 50 MG tablet Take 50 mg by mouth daily.   Yes Historical Provider, MD  Lutein 6 MG CAPS Take 1 capsule by mouth daily.   Yes Historical Provider, MD  meclizine (ANTIVERT) 25 MG tablet Take 25 mg by mouth 3 (three) times daily as needed for dizziness.   Yes Historical Provider, MD  memantine (NAMENDA) 10 MG tablet Take 10 mg by mouth 2 (two) times daily.   Yes Historical Provider, MD  Multiple Vitamin (DAILY VITE) TABS Take 1 tablet by mouth daily.   Yes Historical Provider, MD  omeprazole (PRILOSEC) 20 MG capsule Take 20 mg by mouth daily. 02/02/15  Yes Historical Provider, MD  Polyethyl Glycol-Propyl Glycol (SYSTANE OP) Apply 1 drop to eye 4 (four) times daily.   Yes Historical Provider, MD     Allergies:  Allergies  Allergen Reactions  . Amoxicillin-Pot Clavulanate     REACTION: UNKNOWN REACTION  . Cephalexin     REACTION: UNKNOWN REACTION  . Nsaids     UNKNOWN REACTION    Social History:   reports that she has never smoked. She does not have any smokeless tobacco history on file. She reports that she does not drink alcohol or use illicit drugs.  Family History: Family History  Problem Relation Age of Onset  . Thyroid disease Neg Hx      Physical Exam: Filed Vitals:   06/06/15 1948 06/06/15 2000 06/06/15 2030  BP: 146/79 154/70   Pulse: 78 80 80  Temp: 102.3 F (39.1 C)    TempSrc: Oral    Resp: Weight: 54.432 kg (120 lb)    SpO2:  96% 95%   Blood pressure 154/70, pulse 80, temperature 102.3 F (39.1 C), temperature source Oral, resp. rate 21, weight 54.432 kg (120 lb), SpO2 95 %.  GEN:  Pleasant  patient lying in the stretcher in no acute distress; cooperative with exam. PSYCH:  alert and oriented x4; does not appear anxious or depressed; affect is  appropriate. HEENT: Mucous membranes pink and anicteric; PERRLA; EOM intact; no cervical lymphadenopathy nor thyromegaly or carotid bruit; no JVD; There were no stridor. Neck is very supple. Breasts:: Not examined CHEST WALL: No tenderness CHEST: Normal respiration, clear to auscultation bilaterally.  HEART: Regular rate and rhythm.  There are no murmur, rub, or gallops.   BACK: No kyphosis or scoliosis; no CVA tenderness ABDOMEN: soft and non-tender; no masses, no organomegaly, normal abdominal bowel sounds; no pannus; no intertriginous candida. There is no rebound and no distention. Rectal Exam: Not done EXTREMITIES: No bone or joint deformity; age-appropriate arthropathy of the hands and knees; no edema; no ulcerations.  There is no calf tenderness. Genitalia: not examined PULSES: 2+ and symmetric SKIN: Normal hydration no rash or ulceration CNS: Cranial nerves 2-12 grossly intact no focal lateralizing neurologic deficit.  Speech is fluent;  uvula elevated with phonation, facial symmetry and tongue midline. DTR are normal bilaterally, cerebella exam is intact, barbinski is negative and strengths are equaled bilaterally.  No sensory loss.   Labs on Admission:  Basic Metabolic Panel:  Recent Labs Lab 06/06/15 2023  NA 136  K 4.4  CL 102  CO2 26  GLUCOSE 119*  BUN 34*  CREATININE 1.32*  CALCIUM 8.8*   Liver Function Tests:  Recent Labs Lab 06/06/15 2023  AST 23  ALT 16  ALKPHOS 81  BILITOT 0.8  PROT 6.8  ALBUMIN 3.8   No results for input(s): LIPASE, AMYLASE in the last 168 hours. No results for input(s): AMMONIA in the last 168 hours. CBC:  Recent Labs Lab 06/06/15 2023  WBC 9.9  NEUTROABS 7.4  HGB 11.6*  HCT 35.1*  MCV 97.2  PLT 181   Cardiac Enzymes:  Recent Labs Lab 06/06/15 2023  TROPONINI 0.05*   Radiological Exams on Admission: Dg Chest 2 View  06/06/2015  CLINICAL DATA:  Weakness, confusion, and vertigo. EXAM: CHEST  2 VIEW COMPARISON:   09/17/2014 FINDINGS: Focal ill-defined airspace disease seen in the right upper lobe which is new since previous study, suspicious for pneumonia. Changes of COPD are again noted. Left lung remains clear. No evidence of pleural effusion. Heart size is at the upper limits of normal and stable. IMPRESSION: New right upper lobe airspace opacity, suspicious for pneumonia. Recommend clinical correlation; consider followup PA and lateral chest X-ray in 3-4 weeks following trial of antibiotic therapy to ensure resolution/exclude underlying malignancy. COPD. Electronically Signed   By: Myles Rosenthal M.D.   On: 06/06/2015 21:14    EKG: Independently reviewed.    Assessment/Plan Present on Admission:  . HCAP (healthcare-associated pneumonia) . Dementia . Hypothyroidism . Essential hypertension  PLAN:  Will admit her to the general medical floor.  I will continue with her IV Van/ IV Elita Quick (she tolerated well in the ER).  WIll continue most of her home meds.  Her Lactic acid is normal.  For her hypothyroidism, will continue supplement and check TSH.  For her dementia, it is mild, and will continue Aricept and Namenda.  She is stable, and will be admitted to general medical floor.  Will maintain her DNR code status (reconfirmed tonight).   Thank you and Good Day.   Other plans as per orders.  Code Status: DNR.    Houston Siren, MD. [facility] Pager [phone] 7pm to 7am.  06/06/2015, 9:58 PM

## 2015-06-06 NOTE — ED Provider Notes (Signed)
CSN: [id number]     Arrival date & time 06/06/15  1943 History   First MD Initiated Contact with Patient 06/06/15 1944     Chief Complaint  Patient presents with  . Weakness     (Consider location/radiation/quality/duration/timing/severity/associated sxs/prior Treatment) HPI Patient presents from her nursing facility with staff concerns of confusion, weakness. The patient herself states that she does not feel hungry, but otherwise denies any other complaints, including pain, dyspnea, discomfort, nausea. Patient has a history of dementia, level V caveat. No nursing home report of other new fever changes, nor falling, nor syncope.  Past Medical History  Diagnosis Date  . Thyroid disease   . Hypertension   . Dementia   . Anemia   . Gastric ulcer     gi bleed secondary  . Renal disorder   . Vertigo    Past Surgical History  Procedure Laterality Date  . Thyroid surgery    . Mastectomy    . Carpal tunnel release    . Abdominal hysterectomy    . Femur im nail  05/13/2012    Procedure: INTRAMEDULLARY (IM) NAIL FEMORAL;  Surgeon: Senaida Lange, MD;  Location: [facility] OR;  Service: Orthopedics;  Laterality: Right;  . Fracture surgery     Family History  Problem Relation Age of Onset  . Thyroid disease Neg Hx    Social History  Substance Use Topics  . Smoking status: Never Smoker   . Smokeless tobacco: None  . Alcohol Use: No   OB History    No data available     Review of Systems  Unable to perform ROS: Dementia      Allergies  Amoxicillin-pot clavulanate; Cephalexin; and Nsaids  Home Medications   Prior to Admission medications   Medication Sig Start Date End Date Taking? Authorizing Provider  acetaminophen (TYLENOL) 325 MG tablet Take 650 mg by mouth every 4 (four) hours as needed for mild pain or fever.    Historical Provider, MD  acetaminophen (TYLENOL) 500 MG tablet Take 1,000 mg by mouth at bedtime.    Historical Provider, MD  ALPRAZolam Prudy Feeler) 0.25 MG  tablet Take 1 tablet (0.25 mg total) by mouth 2 (two) times daily. 05/16/12   Leroy Sea, MD  Biotin 300 MCG TABS Take 300 mcg by mouth daily.     Historical Provider, MD  calcium-vitamin D (OSCAL WITH D) 500-200 MG-UNIT per tablet Take 1 tablet by mouth daily.    Historical Provider, MD  donepezil (ARICEPT) 10 MG tablet Take 10 mg by mouth daily.     Historical Provider, MD  escitalopram (LEXAPRO) 10 MG tablet Take 10 mg by mouth daily.    Historical Provider, MD  feeding supplement (ENSURE COMPLETE) LIQD Take 237 mLs by mouth as needed (for nutritional supplementation). 05/16/12   Leroy Sea, MD  ferrous sulfate 325 (65 FE) MG tablet Take 1 tablet (325 mg total) by mouth 3 (three) times daily with meals. Patient taking differently: Take 325 mg by mouth at bedtime.  05/24/12   Erick Blinks, MD  HYDROcodone-acetaminophen (NORCO/VICODIN) 5-325 MG per tablet Take 0.5 tablets by mouth every 4 (four) hours as needed for moderate pain. 09/17/14   Gilda Crease, MD  levothyroxine (SYNTHROID) 125 MCG tablet Take 125-250 mcg by mouth daily before breakfast. Patient takes Mon-Sat. And on Sunday    Historical Provider, MD  loperamide (IMODIUM A-D) 2 MG tablet Take 2 mg by mouth 2 (two) times daily.    Historical  Provider, MD  loratadine (CLARITIN) 10 MG tablet Take 10 mg by mouth daily.    Historical Provider, MD  losartan (COZAAR) 50 MG tablet Take 50 mg by mouth daily.    Historical Provider, MD  Lutein 6 MG CAPS Take 1 capsule by mouth daily.    Historical Provider, MD  meclizine (ANTIVERT) 25 MG tablet Take 25 mg by mouth 3 (three) times daily as needed for dizziness.    Historical Provider, MD  memantine (NAMENDA) 10 MG tablet Take 10 mg by mouth 2 (two) times daily.    Historical Provider, MD  Multiple Vitamin (DAILY VITE) TABS Take 1 tablet by mouth daily.    Historical Provider, MD  Multiple Vitamins-Minerals (PRESERVISION AREDS 2) CAPS Take 1 capsule by mouth 2 (two)  times daily.    Historical Provider, MD  omeprazole (PRILOSEC) 20 MG capsule Take 20 mg by mouth daily. 02/02/15   Historical Provider, MD  pantoprazole (PROTONIX) 40 MG tablet Take 40 mg by mouth daily.  05/24/12   Erick BlinksJehanzeb Memon, MD  Polyethyl Glycol-Propyl Glycol (SYSTANE OP) Apply 1 drop to eye 4 (four) times daily.    Historical Provider, MD  senna (SENOKOT) 8.6 MG TABS Take 1 tablet (8.6 mg total) by mouth 2 (two) times daily. 05/16/12   Leroy SeaPrashant K Singh, MD  SYNTHROID 125 MCG tablet TAKE 2 TABLETS (250mcg) BY MOUTH EACH MORNING. 04/07/14   Reather LittlerAjay Kumar, MD  traMADol (ULTRAM) 50 MG tablet Take 1 tablet by mouth every 6 (six) hours as needed for moderate pain.  08/11/14   Historical Provider, MD   BP 146/79 mmHg  Pulse 78  Temp(Src) 102.3 F (39.1 C) (Oral)  Resp 16  Wt 120 lb (54.432 kg) Physical Exam  Constitutional: She appears well-developed and well-nourished. No distress.  HENT:  Head: Normocephalic and atraumatic.  Eyes: Conjunctivae and EOM are normal.  Cardiovascular: Normal rate and regular rhythm.   Pulmonary/Chest: Effort normal and breath sounds normal. No stridor. No respiratory distress.  Abdominal: She exhibits no distension. There is no tenderness.  Musculoskeletal: She exhibits no edema.  Neurological: She is alert. She displays no tremor. No cranial nerve deficit. She displays no seizure activity. Coordination normal.  Skin: Skin is warm and dry.  Psychiatric: She has a normal mood and affect. Cognition and memory are impaired.  Patient speaks clearly, answers questions briefly, appropriately, does not have long-term recall  Nursing note and vitals reviewed.   ED Course  Procedures (including critical care time) Labs Review Labs Reviewed  COMPREHENSIVE METABOLIC PANEL - Abnormal; Notable for the following:    Glucose, Bld 119 (*)    BUN 34 (*)    Creatinine, Ser 1.32 (*)    Calcium 8.8 (*)    GFR calc non Af Amer 34 (*)    GFR calc Af Amer 39 (*)    All  other components within normal limits  BRAIN NATRIURETIC PEPTIDE - Abnormal; Notable for the following:    B Natriuretic Peptide 288.0 (*)    All other components within normal limits  TROPONIN I - Abnormal; Notable for the following:    Troponin I 0.05 (*)    All other components within normal limits  CBC WITH DIFFERENTIAL/PLATELET - Abnormal; Notable for the following:    RBC 3.61 (*)    Hemoglobin 11.6 (*)    HCT 35.1 (*)    Monocytes Absolute 1.3 (*)    All other components within normal limits  URINALYSIS, ROUTINE W REFLEX MICROSCOPIC (NOT AT  [facility])  I-STAT CG4 LACTIC ACID, ED    Imaging Review Dg Chest 2 View  06/06/2015  CLINICAL DATA:  Weakness, confusion, and vertigo. EXAM: CHEST  2 VIEW COMPARISON:  09/17/2014 FINDINGS: Focal ill-defined airspace disease seen in the right upper lobe which is new since previous study, suspicious for pneumonia. Changes of COPD are again noted. Left lung remains clear. No evidence of pleural effusion. Heart size is at the upper limits of normal and stable. IMPRESSION: New right upper lobe airspace opacity, suspicious for pneumonia. Recommend clinical correlation; consider followup PA and lateral chest X-ray in 3-4 weeks following trial of antibiotic therapy to ensure resolution/exclude underlying malignancy. COPD. Electronically Signed   By: Myles Rosenthal M.D.   On: 06/06/2015 21:14   I have personally reviewed and evaluated these images and lab results as part of my medical decision-making.   EKG Interpretation   Date/Time:  Saturday June 06 2015 19:59:14 EDT Ventricular Rate:  79 PR Interval:  168 QRS Duration: 90 QT Interval:  401 QTC Calculation: 460 R Axis:   -66 Text Interpretation:  Sinus rhythm Left anterior fascicular block Probable  anteroseptal infarct, old Sinus rhythm Left axis deviation Left anterior  fasicular block Artifact Abnormal ekg Confirmed by Gerhard Munch  MD  [id number]) on 06/06/2015 8:08:09 PM     On repeat exam  the patient is in similar condition, now states that she has had a cough for some time.  MDM   Final diagnoses:  HCAP (healthcare-associated pneumonia)  Healthcare-associated pneumonia  Confusion   Elderly female presents from nursing facility with fever, confusion. No evidence for meningitis, though the patient is confused. Patient's evaluation is notable for demonstration of pneumonia. Patient received initial antibiotics and emergency department, was admitted for further evaluation, management.  Gerhard Munch, MD 06/06/15 2131

## 2015-06-06 NOTE — Progress Notes (Signed)
ANTIBIOTIC CONSULT NOTE - INITIAL  Pharmacy Consult for vancomycin Indication: pneumonia  Allergies  Allergen Reactions  . Amoxicillin-Pot Clavulanate     REACTION: UNKNOWN REACTION  . Cephalexin     REACTION: UNKNOWN REACTION  . Nsaids     Patient Measurements: Weight: 120 lb (54.432 kg)   Vital Signs: Temp: 102.3 F (39.1 C) (10/15 1948) Temp Source: Oral (10/15 1948) BP: 154/70 mmHg (10/15 2000) Pulse Rate: 80 (10/15 2030) Intake/Output from previous day:   Intake/Output from this shift:    Labs:  Recent Labs  06/06/15 2023  WBC 9.9  HGB 11.6*  PLT 181  CREATININE 1.32*   CrCl cannot be calculated (Unknown ideal weight.). No results for input(s): VANCOTROUGH, VANCOPEAK, VANCORANDOM, GENTTROUGH, GENTPEAK, GENTRANDOM, TOBRATROUGH, TOBRAPEAK, TOBRARND, AMIKACINPEAK, AMIKACINTROU, AMIKACIN in the last 72 hours.   Microbiology: No results found for this or any previous visit (from the past 720 hour(s)).  Medical History: Past Medical History  Diagnosis Date  . Thyroid disease   . Hypertension   . Dementia   . Anemia   . Gastric ulcer     gi bleed secondary  . Renal disorder   . Vertigo     Medications:  See medication history Assessment: 81 yo lady to start broad spectrum antibiotics for PNA.  Her CrCl ~ 35 ml/min  Goal of Therapy:  Vancomycin trough level 15-20 mcg/ml  Plan:  Vancomycin 1 gm IV X 1 then 750 mg IV q24 hours Will adjust ceftazidime to 2gm IV q12 hours F/u renal function, cultures and clinical course  Thanks for allowing pharmacy to be a part of this patient's care.  Talbert CageLora Zane Samson, PharmD Clinical Pharmacist 06/06/2015,9:26 PM

## 2015-06-06 NOTE — ED Notes (Signed)
Patient's O2 dropped to 88% while sleeping. Started patient on 2L via [place].

## 2015-06-06 NOTE — ED Notes (Signed)
Pt arrived by EMS from [facility]. Advised staff says pt has been weak & confused today & has not eaten much today. Pt is able to answer questions & feels a little wobbly on her feet.

## 2015-06-07 DIAGNOSIS — E039 Hypothyroidism, unspecified: Secondary | ICD-10-CM

## 2015-06-07 DIAGNOSIS — J9601 Acute respiratory failure with hypoxia: Secondary | ICD-10-CM | POA: Diagnosis present

## 2015-06-07 DIAGNOSIS — N183 Chronic kidney disease, stage 3 unspecified: Secondary | ICD-10-CM | POA: Diagnosis present

## 2015-06-07 DIAGNOSIS — K219 Gastro-esophageal reflux disease without esophagitis: Secondary | ICD-10-CM | POA: Diagnosis present

## 2015-06-07 HISTORY — DX: Chronic kidney disease, stage 3 unspecified: N18.30

## 2015-06-07 LAB — COMPREHENSIVE METABOLIC PANEL
ALBUMIN: 3 g/dL — AB (ref 3.5–5.0)
ALK PHOS: 61 U/L (ref 38–126)
ALT: 12 U/L — AB (ref 14–54)
ANION GAP: 8 (ref 5–15)
AST: 18 U/L (ref 15–41)
BILIRUBIN TOTAL: 0.7 mg/dL (ref 0.3–1.2)
BUN: 30 mg/dL — ABNORMAL HIGH (ref 6–20)
CALCIUM: 8 mg/dL — AB (ref 8.9–10.3)
CO2: 23 mmol/L (ref 22–32)
CREATININE: 1.12 mg/dL — AB (ref 0.44–1.00)
Chloride: 107 mmol/L (ref 101–111)
GFR calc Af Amer: 48 mL/min — ABNORMAL LOW (ref >60)
GFR calc non Af Amer: 41 mL/min — ABNORMAL LOW (ref >60)
GLUCOSE: 103 mg/dL — AB (ref 65–99)
Potassium: 4 mmol/L (ref 3.5–5.1)
Sodium: 138 mmol/L (ref 135–145)
TOTAL PROTEIN: 5.5 g/dL — AB (ref 6.5–8.1)

## 2015-06-07 LAB — MRSA PCR SCREENING: MRSA BY PCR: NEGATIVE

## 2015-06-07 LAB — CBC
HCT: 31.9 % — ABNORMAL LOW (ref 36.0–46.0)
Hemoglobin: 10.5 g/dL — ABNORMAL LOW (ref 12.0–15.0)
MCH: 32.5 pg (ref 26.0–34.0)
MCHC: 32.9 g/dL (ref 30.0–36.0)
MCV: 98.8 fL (ref 78.0–100.0)
Platelets: 150 10*3/uL (ref 150–400)
RBC: 3.23 MIL/uL — ABNORMAL LOW (ref 3.87–5.11)
RDW: 13.1 % (ref 11.5–15.5)
WBC: 7.1 10*3/uL (ref 4.0–10.5)

## 2015-06-07 LAB — TSH: TSH: 0.192 u[IU]/mL — AB (ref 0.350–4.500)

## 2015-06-07 MED ORDER — OCUVITE-LUTEIN PO CAPS
1.0000 | ORAL_CAPSULE | Freq: Every day | ORAL | Status: DC
  Administered 2015-06-07: 1 via ORAL
  Filled 2015-06-07: qty 1

## 2015-06-07 MED ORDER — VANCOMYCIN HCL IN DEXTROSE 750-5 MG/150ML-% IV SOLN
750.0000 mg | INTRAVENOUS | Status: DC
  Administered 2015-06-07: 750 mg via INTRAVENOUS
  Filled 2015-06-07 (×2): qty 150

## 2015-06-07 NOTE — Progress Notes (Signed)
Utilization review completed.  

## 2015-06-07 NOTE — Progress Notes (Signed)
[facility] PROGRESS NOTE  [name] ZOX:[id number] DOB: Oct 23, 1919 DOA: 06/06/2015 PCP: Colette RibasGOLDING, JOHN CABOT, MD  Assessment/Plan: 1. Acute respiratory failure with hypoxia related to pneumonia. We will try and wean off oxygen as tolerated. 2. Healthcare associated pneumonia. Patient started empirically on vancomycin and Fortaz. This can be further D escalated as her clinical condition improves. If she remains stable, can possibly de-escalate antibiotics tomorrow. Lactic acid was normal on admission. 3. Chronic kidney disease stage III. Creatinine appears to be at baseline. Continue to follow. 4. Hypertension. Appears stable. Continue losartan. 5. Dementia. Continue Aricept and Namenda. 6. Hypothyroidism. Continue on outpatient dose of Synthroid. 7. GERD. Continue PPI.  Code Status: DNR Family Communication: no family present Disposition Plan: discharge back to SNF when stable, possibly tomorrow if stable   Consultants:    Procedures:    Antibiotics:  Ceftazidime 10/15>>  Vancomycin 10/15>>  HPI/Subjective: No new complaints. She is confused. Admits to cough.  Objective: Filed Vitals:   06/07/15 0548  BP: 128/58  Pulse: 58  Temp: 97.7 F (36.5 C)  Resp: 18    Intake/Output Summary (Last 24 hours) at 06/07/15 1417 Last data filed at 06/07/15 0853  Gross per 24 hour  Intake    300 ml  Output      0 ml  Net    300 ml   Filed Weights   06/06/15 1948 06/07/15 0124  Weight: 54.432 kg (120 lb) 49.76 kg (109 lb 11.2 oz)    Exam:   General:  Awake, confused, no distress  Cardiovascular: s1, s2, rrr  Respiratory: clear bilaterally  Abdomen: soft, nt, nd, bs+  Musculoskeletal: no edema b/l   Data Reviewed: Basic Metabolic Panel:  Recent Labs Lab 06/06/15 2023 06/07/15 0625  NA 136 138  K 4.4 4.0  CL 102 107  CO2 26 23  GLUCOSE 119* 103*  BUN 34* 30*  CREATININE 1.32* 1.12*  CALCIUM 8.8* 8.0*   Liver Function Tests:  Recent  Labs Lab 06/06/15 2023 06/07/15 0625  AST 23 18  ALT 16 12*  ALKPHOS 81 61  BILITOT 0.8 0.7  PROT 6.8 5.5*  ALBUMIN 3.8 3.0*   No results for input(s): LIPASE, AMYLASE in the last 168 hours. No results for input(s): AMMONIA in the last 168 hours. CBC:  Recent Labs Lab 06/06/15 2023 06/07/15 0625  WBC 9.9 7.1  NEUTROABS 7.4  --   HGB 11.6* 10.5*  HCT 35.1* 31.9*  MCV 97.2 98.8  PLT 181 150   Cardiac Enzymes:  Recent Labs Lab 06/06/15 2023  TROPONINI 0.05*   BNP (last 3 results)  Recent Labs  06/06/15 2023  BNP 288.0*    ProBNP (last 3 results) No results for input(s): PROBNP in the last 8760 hours.  CBG: No results for input(s): GLUCAP in the last 168 hours.  Recent Results (from the past 240 hour(s))  MRSA PCR Screening     Status: None   Collection Time: 06/07/15  1:30 AM  Result Value Ref Range Status   MRSA by PCR NEGATIVE NEGATIVE Final    Comment:        The GeneXpert MRSA Assay (FDA approved for NASAL specimens only), is one component of a comprehensive MRSA colonization surveillance program. It is not intended to diagnose MRSA infection nor to guide or monitor treatment for MRSA infections.      Studies: Dg Chest 2 View  06/06/2015  CLINICAL DATA:  Weakness, confusion, and vertigo. EXAM: CHEST  2 VIEW COMPARISON:  09/17/2014 FINDINGS: Focal ill-defined airspace disease seen in the right upper lobe which is new since previous study, suspicious for pneumonia. Changes of COPD are again noted. Left lung remains clear. No evidence of pleural effusion. Heart size is at the upper limits of normal and stable. IMPRESSION: New right upper lobe airspace opacity, suspicious for pneumonia. Recommend clinical correlation; consider followup PA and lateral chest X-ray in 3-4 weeks following trial of antibiotic therapy to ensure resolution/exclude underlying malignancy. COPD. Electronically Signed   By: Myles Rosenthal M.D.   On: 06/06/2015 21:14    Scheduled  Meds: . acetaminophen  1,000 mg Oral QHS  . cefTAZidime (FORTAZ)  IV  2 g Intravenous Q12H  . donepezil  10 mg Oral Daily  . escitalopram  10 mg Oral Daily  . ferrous sulfate  325 mg Oral QHS  . heparin  5,000 Units Subcutaneous 3 times per day  . levothyroxine  224 mcg Oral QAC breakfast  . losartan  50 mg Oral Daily  . memantine  10 mg Oral BID  . multivitamin with minerals  1 tablet Oral Daily  . multivitamin-lutein  1 capsule Oral Daily  . pantoprazole  40 mg Oral Daily  . vancomycin  750 mg Intravenous Q24H   Continuous Infusions:   Principal Problem:   HCAP (healthcare-associated pneumonia) Active Problems:   Essential hypertension   Hypothyroidism   Dementia    Time spent:    [facility]  [facility] Pager [phone]. If 7PM-7AM, please contact night-coverage at [email or web address], password [facility] 06/07/2015, 2:17 PM  LOS: 1 day

## 2015-06-08 DIAGNOSIS — J189 Pneumonia, unspecified organism: Principal | ICD-10-CM

## 2015-06-08 DIAGNOSIS — N183 Chronic kidney disease, stage 3 (moderate): Secondary | ICD-10-CM

## 2015-06-08 DIAGNOSIS — I10 Essential (primary) hypertension: Secondary | ICD-10-CM

## 2015-06-08 DIAGNOSIS — E038 Other specified hypothyroidism: Secondary | ICD-10-CM

## 2015-06-08 DIAGNOSIS — J9601 Acute respiratory failure with hypoxia: Secondary | ICD-10-CM

## 2015-06-08 MED ORDER — LEVOTHYROXINE SODIUM 100 MCG PO TABS: 200.0000 ug | ORAL_TABLET | Freq: Every day | ORAL | Status: DC

## 2015-06-08 MED ORDER — LEVOFLOXACIN 500 MG PO TABS: 500.0000 mg | ORAL_TABLET | Freq: Every day | ORAL | Status: DC

## 2015-06-08 NOTE — Clinical Social Work Note (Signed)
Pt d/c today back to [facility]. Pt, pt's son [name], and facility aware and agreeable. D/C summary and FL2 faxed. Pt's son will pick up pt this afternoon.  Derenda FennelKara Ona Roehrs, LCSW [phone]

## 2015-06-08 NOTE — Care Management Note (Signed)
Case Management Note  Patient Details  Name: [name] MRN: [id number] Date of Birth: 07/05/1920  Subjective/Objective:                  Pt admitted from [facility] with pneumonia. Anticipate discharge back to facility when medically stable for discharge. Pt has a walker that she uses at the facility.  Action/Plan: CSW is aware and will arrange discharge back to facility at discharge.  Expected Discharge Date:                  Expected Discharge Plan:  Assisted Living / Rest Home  In-House Referral:  Clinical Social Work  Discharge planning Services  CM Consult  Post Acute Care Choice:  NA Choice offered to:  NA  DME Arranged:    DME Agency:     HH Arranged:    HH Agency:     Status of Service:  Completed, signed off  Medicare Important Message Given:    Date Medicare IM Given:    Medicare IM give by:    Date Additional Medicare IM Given:    Additional Medicare Important Message give by:     If discussed at Long Length of Stay Meetings, dates discussed:    Additional Comments:  Cheryl FlashBlackwell, Tonnia Bardin Crowder, RN 06/08/2015, 11:03 AM

## 2015-06-08 NOTE — Care Management Note (Signed)
Case Management Note  Patient Details  Name: [name] MRN: [id number] Date of Birth: [birth year]  Subjective/Objective:                    Action/Plan:   Expected Discharge Date:                  Expected Discharge Plan:  Assisted Living / Rest Home  In-House Referral:  Clinical Social Work  Discharge planning Services  CM Consult  Post Acute Care Choice:  NA Choice offered to:  NA  DME Arranged:    DME Agency:     HH Arranged:    HH Agency:     Status of Service:  Completed, signed off  Medicare Important Message Given:  N/A - LOS <3 / Initial given by admissions Date Medicare IM Given:    Medicare IM give by:    Date Additional Medicare IM Given:    Additional Medicare Important Message give by:     If discussed at Long Length of Stay Meetings, dates discussed:    Additional Comments: Pt discharged back to [facility]. CSW to arrange discharge to facility. Arlyss QueenBlackwell, Torrance Stockley [place] Simonrowder, RN 06/08/2015, 3:32 PM

## 2015-06-08 NOTE — Clinical Social Work Note (Signed)
Clinical Social Work Assessment  Patient Details  Name: [name] MRN: [id number] Date of Birth: Apr 26, 1920  Date of referral:  06/08/15               Reason for consult:  Facility Placement                Permission sought to share information with:    Permission granted to share information::     Name::        Agency::     Relationship::     Contact Information:     Housing/Transportation Living arrangements for the past 2 months:  [place] of Information:  Patient Patient Interpreter Needed:  None Criminal Activity/Legal Involvement Pertinent to Current Situation/Hospitalization:  No - Comment as needed Significant Relationships:  Adult Children, Other Family Members Lives with:  Facility Resident Do you feel safe going back to the place where you live?  Yes Need for family participation in patient care:  Yes (Comment)  Care giving concerns:  Pt is resident at ALF.   Social Worker assessment / plan:  CSW met with pt at bedside. Pt alert and oriented and reports she has been a resident at [facility] for about 5 years. Her family is very involved and supportive and visit regularly. Pt shared that she loves it at [facility] and is eager to return. She walks with a walker at baseline, but laughed and said she would prefer not to. Anticipate d/c soon. Per Tammy at facility, pt is fairly independent, only requiring assist with bathing. No home health prior to admission. Okay to return when stable.   Employment status:  Retired Forensic scientist:  Medicare PT Recommendations:  Not assessed at this time [place] / Referral to community resources:  Other (Comment Required) (return to [facility])  Patient/Family's Response to care:  Pt requests to return to [place] at d/c.   Patient/Family's Understanding of and Emotional Response to Diagnosis, Current Treatment, and Prognosis:  Pt aware of admission diagnosis and treatment plan. She shared that she  believes plan is to d/c today. Will follow.   Emotional Assessment Appearance:  Appears stated age Attitude/Demeanor/Rapport:  Other (Cooperative) Affect (typically observed):  Appropriate, Pleasant Orientation:  Oriented to Self, Oriented to Place, Oriented to  Time, Oriented to Situation Alcohol / Substance use:  Not Applicable Psych involvement (Current and /or in the community):  No (Comment)  Discharge Needs  Concerns to be addressed:  Discharge Planning Concerns Readmission within the last 30 days:  No Current discharge risk:  None Barriers to Discharge:  Continued Medical Work up   [employer], [place] 06/08/2015, 9:46 AM [phone]

## 2015-06-08 NOTE — Discharge Summary (Signed)
Physician Discharge Summary  [name]:[id number] DOB: 11-Apr-1920 DOA: 06/06/2015  PCP: [name], MD  Admit date: 06/06/2015 Discharge date: 06/08/2015  Time spent: Greater than 30 minutes  Recommendations for Outpatient Follow-up:  1. Recommend follow-up of the patient's renal function and pulmonary status. 2. Recommend rechecking the patient's TSH in 3-4 weeks. Recommend checking free T4 and free T3 as well. 3. Patient is being discharged to ALF.    Discharge Diagnoses:  1. Healthcare associated pneumonia. 2. Acute respiratory failure with half proxy is secondary to pneumonia. Resolved. 3. Chronic kidney disease, stage III. 4. Essential hypertension. 5. Hypothyroidism. -TSH low at 0.19, therefore Synthroid dose decreased from 224 g to 200 g daily. -Recommend rechecking TSH in 3-4 weeks. 6. GERD. 7. Dementia. 8. Mild normocytic anemia with decrease in hemoglobin secondary to dilutional effects of IV fluids. 9. Mildly elevated troponin I secondary to pneumonia.  Discharge Condition: Improved.  Diet recommendation: Heart healthy.  Filed Weights   06/06/15 1948 06/07/15 0124  Weight: 54.432 kg (120 lb) 49.76 kg (109 lb 11.2 oz)    History of present illness:  The patient is a 81 year old woman with a history of mild dementia, hypertension, hypothyroidism, and chronic anemia, who presented to the emergency department on 06/06/2015 with a chief complaint of cough and weakness. In the ED, she was febrile with a temperature 102.3, oxygenating at 88%, but her blood pressure was within normal limits. Her white blood cell count was within normal limits. Her chest x-ray revealed new right upper lobe airspace opacities suspicious for pneumonia and COPD changes. She was admitted for further evaluation and management.  Hospital Course:  1. Healthcare associated pneumonia and acute hypoxic respiratory failure secondary to pneumonia. The patient was started on oxygen  titrated to keep her oxygen saturations greater than 90-92%. Antibiotic therapy was started with IV vancomycin and Fortaz. Although she was febrile, blood cultures were not ordered on admission. Her lactic acid level was however normal. Patient improved clinically and symptomatically. She became afebrile. Her white blood cell count remained within normal limits. After several days of treatment, her oxygen saturations improved. Prior to discharge, she ambulated on room air and her oxygen saturations ranged from 90-97%. She received 3 days of IV antibiotic therapy. She was discharged on 5 more days of Levaquin.  Mildly elevated troponin I. Patient's troponin I was minimally elevated at 0.05. Her EKG revealed no worrisome changes. The mild elevation was likely secondary to pneumonia and not ACS. Of note, the patient had no complaints of chest pain.  Hypothyroidism. The patient was chronically treated with 224 g of Synthroid. Apparently, she is followed by endocrinologist, Dr. [name]. Her TSH was low at 0.19. Therefore, at the time of discharge, the dose was decreased to 200 g daily. Recommend follow-up of her TSH in 3-4 weeks.  Chronic kidney disease, stage III. The patient's creatinine remained at baseline.  Essential hypertension. Her blood pressure was fairly well controlled on losartan. It was continued.  Mild dementia. The patient was continued on Namenda and Aricept. Her condition was stable.  Mild normocytic anemia. The patient's hemoglobin was 11.6 on admission. With gentle IV fluids, it fell to 10.5. There was no evidence of GI or GU bleeding. Given her age, would recommend monitoring without significant intervention. She was continued on her multivitamins.    Procedures:  None  Consultations:  None  Discharge Exam: Filed Vitals:   06/08/15 1245  BP:   Pulse: 68  Temp:   Resp:  temperature 98.0. Pulse 68. Respiratory rate 16. Blood pressure 122/51. Oxygen saturation 92%  on room air.  General: Pleasant elderly 81 year old woman who appears younger than her stated age. No acute distress. Cardiovascular: S1, S2, with a soft systolic murmur. Respiratory: Coarse breath sounds, but no significant audible wheezes or crackles. Breathing nonlabored at rest.  Discharge Instructions   Discharge Instructions    Diet - low sodium heart healthy    Complete by:  As directed      Diet - low sodium heart healthy    Complete by:  As directed      Increase activity slowly    Complete by:  As directed      Increase activity slowly    Complete by:  As directed           Current Discharge Medication List    START taking these medications   Details  levofloxacin (LEVAQUIN) 500 MG tablet Take 1 tablet (500 mg total) by mouth daily. Antibiotic to be taken for 5 more days, starting on 06/09/15. Qty: 5 tablet, Refills: 0      CONTINUE these medications which have CHANGED   Details  levothyroxine (SYNTHROID, LEVOTHROID) 100 MCG tablet Take 2 tablets (200 mcg total) by mouth daily before breakfast. Qty: 60 tablet, Refills: 2      CONTINUE these medications which have NOT CHANGED   Details  !! acetaminophen (TYLENOL) 325 MG tablet Take 650 mg by mouth every 4 (four) hours as needed for mild pain or fever.    !! acetaminophen (TYLENOL) 500 MG tablet Take 1,000 mg by mouth at bedtime.    Biotin 300 MCG TABS Take 300 mcg by mouth daily.     calcium-vitamin D (OSCAL WITH D) 500-200 MG-UNIT per tablet Take 1 tablet by mouth daily.    donepezil (ARICEPT) 10 MG tablet Take 10 mg by mouth daily.     escitalopram (LEXAPRO) 10 MG tablet Take 10 mg by mouth daily.    feeding supplement (ENSURE COMPLETE) LIQD Take 237 mLs by mouth as needed (for nutritional supplementation).    ferrous sulfate 325 (65 FE) MG tablet Take 1 tablet (325 mg total) by mouth 3 (three) times daily with meals.    loperamide (IMODIUM A-D) 2 MG tablet Take 2 mg by mouth 2 (two) times daily.     loratadine (CLARITIN) 10 MG tablet Take 10 mg by mouth daily.    losartan (COZAAR) 50 MG tablet Take 50 mg by mouth daily.    Lutein 6 MG CAPS Take 1 capsule by mouth daily.    meclizine (ANTIVERT) 25 MG tablet Take 25 mg by mouth 3 (three) times daily as needed for dizziness.    memantine (NAMENDA) 10 MG tablet Take 10 mg by mouth 2 (two) times daily.    Multiple Vitamin (DAILY VITE) TABS Take 1 tablet by mouth daily.    omeprazole (PRILOSEC) 20 MG capsule Take 20 mg by mouth daily.    Polyethyl Glycol-Propyl Glycol (SYSTANE OP) Apply 1 drop to eye 4 (four) times daily.     !! - Potential duplicate medications found. Please discuss with provider.     Allergies  Allergen Reactions  . Amoxicillin-Pot Clavulanate     REACTION: UNKNOWN REACTION  . Cephalexin     REACTION: UNKNOWN REACTION  . Nsaids     UNKNOWN REACTION      The results of significant diagnostics from this hospitalization (including imaging, microbiology, ancillary and laboratory) are listed below for reference.  Significant Diagnostic Studies: Dg Chest 2 View  06/06/2015  CLINICAL DATA:  Weakness, confusion, and vertigo. EXAM: CHEST  2 VIEW COMPARISON:  09/17/2014 FINDINGS: Focal ill-defined airspace disease seen in the right upper lobe which is new since previous study, suspicious for pneumonia. Changes of COPD are again noted. Left lung remains clear. No evidence of pleural effusion. Heart size is at the upper limits of normal and stable. IMPRESSION: New right upper lobe airspace opacity, suspicious for pneumonia. Recommend clinical correlation; consider followup PA and lateral chest X-ray in 3-4 weeks following trial of antibiotic therapy to ensure resolution/exclude underlying malignancy. COPD. Electronically Signed   By: Myles Rosenthal M.D.   On: 06/06/2015 21:14    Microbiology: Recent Results (from the past 240 hour(s))  MRSA PCR Screening     Status: None   Collection Time: 06/07/15  1:30 AM  Result  Value Ref Range Status   MRSA by PCR NEGATIVE NEGATIVE Final    Comment:        The GeneXpert MRSA Assay (FDA approved for NASAL specimens only), is one component of a comprehensive MRSA colonization surveillance program. It is not intended to diagnose MRSA infection nor to guide or monitor treatment for MRSA infections.      Labs: Basic Metabolic Panel:  Recent Labs Lab 06/06/15 2023 06/07/15 0625  NA 136 138  K 4.4 4.0  CL 102 107  CO2 26 23  GLUCOSE 119* 103*  BUN 34* 30*  CREATININE 1.32* 1.12*  CALCIUM 8.8* 8.0*   Liver Function Tests:  Recent Labs Lab 06/06/15 2023 06/07/15 0625  AST 23 18  ALT 16 12*  ALKPHOS 81 61  BILITOT 0.8 0.7  PROT 6.8 5.5*  ALBUMIN 3.8 3.0*   No results for input(s): LIPASE, AMYLASE in the last 168 hours. No results for input(s): AMMONIA in the last 168 hours. CBC:  Recent Labs Lab 06/06/15 2023 06/07/15 0625  WBC 9.9 7.1  NEUTROABS 7.4  --   HGB 11.6* 10.5*  HCT 35.1* 31.9*  MCV 97.2 98.8  PLT 181 150   Cardiac Enzymes:  Recent Labs Lab 06/06/15 2023  TROPONINI 0.05*   BNP: BNP (last 3 results)  Recent Labs  06/06/15 2023  BNP 288.0*    ProBNP (last 3 results) No results for input(s): PROBNP in the last 8760 hours.  CBG: No results for input(s): GLUCAP in the last 168 hours.     Signed:  Evelyna Folker  [facility] 06/08/2015, 2:59 PM

## 2015-06-08 NOTE — Final Progress Note (Signed)
Patient discharged with instructions, prescription, and care notes.  Verbalized understanding via teach back.  IV was removed and the site was WNL. Patient voiced no further complaints or concerns at the time of discharge.  Appointments scheduled per instructions.  Patient left the floor via w/c with staff and family in stable condition.  Packet was given to her son [name] to be given  to staff at the [facility].

## 2015-06-08 NOTE — Care Management Important Message (Signed)
Important Message  Patient Details  Name: Hedda Sladenn P Rondinelli MRN: [id number] Date of Birth: 1919-09-27   Medicare Important Message Given:  N/A - LOS <3 / Initial given by admissions    Cheryl FlashBlackwell, Leeanna Slaby Crowder, RN 06/08/2015, 2:56 PM

## 2015-06-10 DIAGNOSIS — M810 Age-related osteoporosis without current pathological fracture: Secondary | ICD-10-CM | POA: Diagnosis not present

## 2015-06-10 DIAGNOSIS — M25551 Pain in right hip: Secondary | ICD-10-CM | POA: Diagnosis not present

## 2015-06-10 DIAGNOSIS — Z8781 Personal history of (healed) traumatic fracture: Secondary | ICD-10-CM | POA: Diagnosis not present

## 2015-06-10 DIAGNOSIS — F0390 Unspecified dementia without behavioral disturbance: Secondary | ICD-10-CM | POA: Diagnosis not present

## 2015-06-10 DIAGNOSIS — D649 Anemia, unspecified: Secondary | ICD-10-CM | POA: Diagnosis not present

## 2015-06-10 DIAGNOSIS — R2689 Other abnormalities of gait and mobility: Secondary | ICD-10-CM | POA: Diagnosis not present

## 2015-06-10 DIAGNOSIS — N183 Chronic kidney disease, stage 3 (moderate): Secondary | ICD-10-CM | POA: Diagnosis not present

## 2015-06-10 DIAGNOSIS — J189 Pneumonia, unspecified organism: Secondary | ICD-10-CM | POA: Diagnosis not present

## 2015-06-10 DIAGNOSIS — I129 Hypertensive chronic kidney disease with stage 1 through stage 4 chronic kidney disease, or unspecified chronic kidney disease: Secondary | ICD-10-CM | POA: Diagnosis not present

## 2015-06-10 DIAGNOSIS — M150 Primary generalized (osteo)arthritis: Secondary | ICD-10-CM | POA: Diagnosis not present

## 2015-06-12 DIAGNOSIS — I129 Hypertensive chronic kidney disease with stage 1 through stage 4 chronic kidney disease, or unspecified chronic kidney disease: Secondary | ICD-10-CM | POA: Diagnosis not present

## 2015-06-12 DIAGNOSIS — D649 Anemia, unspecified: Secondary | ICD-10-CM | POA: Diagnosis not present

## 2015-06-12 DIAGNOSIS — N183 Chronic kidney disease, stage 3 (moderate): Secondary | ICD-10-CM | POA: Diagnosis not present

## 2015-06-12 DIAGNOSIS — M25551 Pain in right hip: Secondary | ICD-10-CM | POA: Diagnosis not present

## 2015-06-12 DIAGNOSIS — J189 Pneumonia, unspecified organism: Secondary | ICD-10-CM | POA: Diagnosis not present

## 2015-06-12 DIAGNOSIS — M150 Primary generalized (osteo)arthritis: Secondary | ICD-10-CM | POA: Diagnosis not present

## 2015-06-14 DIAGNOSIS — D649 Anemia, unspecified: Secondary | ICD-10-CM | POA: Diagnosis not present

## 2015-06-14 DIAGNOSIS — J189 Pneumonia, unspecified organism: Secondary | ICD-10-CM | POA: Diagnosis not present

## 2015-06-14 DIAGNOSIS — M25551 Pain in right hip: Secondary | ICD-10-CM | POA: Diagnosis not present

## 2015-06-14 DIAGNOSIS — I129 Hypertensive chronic kidney disease with stage 1 through stage 4 chronic kidney disease, or unspecified chronic kidney disease: Secondary | ICD-10-CM | POA: Diagnosis not present

## 2015-06-14 DIAGNOSIS — N183 Chronic kidney disease, stage 3 (moderate): Secondary | ICD-10-CM | POA: Diagnosis not present

## 2015-06-14 DIAGNOSIS — M150 Primary generalized (osteo)arthritis: Secondary | ICD-10-CM | POA: Diagnosis not present

## 2015-06-15 DIAGNOSIS — N183 Chronic kidney disease, stage 3 (moderate): Secondary | ICD-10-CM | POA: Diagnosis not present

## 2015-06-15 DIAGNOSIS — I129 Hypertensive chronic kidney disease with stage 1 through stage 4 chronic kidney disease, or unspecified chronic kidney disease: Secondary | ICD-10-CM | POA: Diagnosis not present

## 2015-06-15 DIAGNOSIS — M25551 Pain in right hip: Secondary | ICD-10-CM | POA: Diagnosis not present

## 2015-06-15 DIAGNOSIS — D649 Anemia, unspecified: Secondary | ICD-10-CM | POA: Diagnosis not present

## 2015-06-15 DIAGNOSIS — M150 Primary generalized (osteo)arthritis: Secondary | ICD-10-CM | POA: Diagnosis not present

## 2015-06-15 DIAGNOSIS — J189 Pneumonia, unspecified organism: Secondary | ICD-10-CM | POA: Diagnosis not present

## 2015-06-18 DIAGNOSIS — J189 Pneumonia, unspecified organism: Secondary | ICD-10-CM | POA: Diagnosis not present

## 2015-06-18 DIAGNOSIS — I129 Hypertensive chronic kidney disease with stage 1 through stage 4 chronic kidney disease, or unspecified chronic kidney disease: Secondary | ICD-10-CM | POA: Diagnosis not present

## 2015-06-18 DIAGNOSIS — M25551 Pain in right hip: Secondary | ICD-10-CM | POA: Diagnosis not present

## 2015-06-18 DIAGNOSIS — N183 Chronic kidney disease, stage 3 (moderate): Secondary | ICD-10-CM | POA: Diagnosis not present

## 2015-06-18 DIAGNOSIS — M150 Primary generalized (osteo)arthritis: Secondary | ICD-10-CM | POA: Diagnosis not present

## 2015-06-18 DIAGNOSIS — D649 Anemia, unspecified: Secondary | ICD-10-CM | POA: Diagnosis not present

## 2015-06-20 ENCOUNTER — Emergency Department (HOSPITAL_COMMUNITY): Payer: Medicare Other

## 2015-06-20 ENCOUNTER — Encounter (HOSPITAL_COMMUNITY): Payer: Self-pay | Admitting: *Deleted

## 2015-06-20 ENCOUNTER — Emergency Department (HOSPITAL_COMMUNITY)
Admission: EM | Admit: 2015-06-20 | Discharge: 2015-06-21 | Disposition: A | Discharge status: Home / Self Care | Payer: Medicare Other | Attending: Emergency Medicine | Admitting: Emergency Medicine

## 2015-06-20 DIAGNOSIS — Y92122 Bedroom in nursing home as the place of occurrence of the external cause: Secondary | ICD-10-CM | POA: Insufficient documentation

## 2015-06-20 DIAGNOSIS — Y939 Activity, unspecified: Secondary | ICD-10-CM | POA: Diagnosis not present

## 2015-06-20 DIAGNOSIS — Y92129 Unspecified place in nursing home as the place of occurrence of the external cause: Secondary | ICD-10-CM

## 2015-06-20 DIAGNOSIS — R011 Cardiac murmur, unspecified: Secondary | ICD-10-CM | POA: Insufficient documentation

## 2015-06-20 DIAGNOSIS — I10 Essential (primary) hypertension: Secondary | ICD-10-CM | POA: Insufficient documentation

## 2015-06-20 DIAGNOSIS — Z880 Allergy status to penicillin: Secondary | ICD-10-CM | POA: Diagnosis not present

## 2015-06-20 DIAGNOSIS — R51 Headache: Secondary | ICD-10-CM | POA: Diagnosis not present

## 2015-06-20 DIAGNOSIS — Y998 Other external cause status: Secondary | ICD-10-CM | POA: Diagnosis not present

## 2015-06-20 DIAGNOSIS — Z79899 Other long term (current) drug therapy: Secondary | ICD-10-CM | POA: Diagnosis not present

## 2015-06-20 DIAGNOSIS — F0390 Unspecified dementia without behavioral disturbance: Secondary | ICD-10-CM | POA: Diagnosis not present

## 2015-06-20 DIAGNOSIS — M545 Low back pain, unspecified: Secondary | ICD-10-CM

## 2015-06-20 DIAGNOSIS — W19XXXA Unspecified fall, initial encounter: Secondary | ICD-10-CM

## 2015-06-20 DIAGNOSIS — Z8719 Personal history of other diseases of the digestive system: Secondary | ICD-10-CM | POA: Diagnosis not present

## 2015-06-20 DIAGNOSIS — Z87448 Personal history of other diseases of urinary system: Secondary | ICD-10-CM | POA: Diagnosis not present

## 2015-06-20 DIAGNOSIS — W06XXXA Fall from bed, initial encounter: Secondary | ICD-10-CM | POA: Diagnosis not present

## 2015-06-20 DIAGNOSIS — E079 Disorder of thyroid, unspecified: Secondary | ICD-10-CM | POA: Diagnosis not present

## 2015-06-20 DIAGNOSIS — S199XXA Unspecified injury of neck, initial encounter: Secondary | ICD-10-CM | POA: Diagnosis not present

## 2015-06-20 DIAGNOSIS — S3992XA Unspecified injury of lower back, initial encounter: Secondary | ICD-10-CM | POA: Insufficient documentation

## 2015-06-20 DIAGNOSIS — D649 Anemia, unspecified: Secondary | ICD-10-CM | POA: Insufficient documentation

## 2015-06-20 DIAGNOSIS — S0990XA Unspecified injury of head, initial encounter: Secondary | ICD-10-CM | POA: Diagnosis not present

## 2015-06-20 NOTE — ED Notes (Signed)
[facility]. Reported pt was getting into bed when she stumbled & fell. States hit her head on bedside table.

## 2015-06-20 NOTE — ED Provider Notes (Signed)
CSN: [id number]     Arrival date & time 06/20/15  2153 History  By signing my name below, I, Lucill Mauck, attest that this documentation has been prepared under the direction and in the presence of Devoria Albe, MD. Electronically Signed: Ronney Lion, ED Scribe. 06/20/2015. 1:05 AM.    Chief Complaint  Patient presents with  . Fall   The history is provided by the patient. No language interpreter was used.    Level V caveat due to dementia  HPI Comments: [name] is a 81 y.o. female brought in by ambulance from a nursing facility, who presents to the Emergency Department S/P fall. Patient states she cannot remember falling, but her family was told that nursing facility had gone to check on her and found her on the floor in her bedroom. She denies any pain anywhere.She told her daughter her head and neck hurt, but the pains have since resolved. She now also states she has some pain in her lower back. Patient reports she ambulates with a walker consistently.  PCP Dr Phillips Odor  Past Medical History  Diagnosis Date  . Thyroid disease   . Hypertension   . Dementia   . Anemia   . Gastric ulcer     gi bleed secondary  . Renal disorder   . Vertigo    Past Surgical History  Procedure Laterality Date  . Thyroid surgery    . Mastectomy    . Carpal tunnel release    . Abdominal hysterectomy    . Femur im nail  05/13/2012    Procedure: INTRAMEDULLARY (IM) NAIL FEMORAL;  Surgeon: Senaida Lange, MD;  Location: [facility] OR;  Service: Orthopedics;  Laterality: Right;  . Fracture surgery     Family History  Problem Relation Age of Onset  . Thyroid disease Neg Hx    Social History  Substance Use Topics  . Smoking status: Never Smoker   . Smokeless tobacco: None  . Alcohol Use: No  uses a walker Lives in NH  OB History    No data available     Review of Systems  Unable to perform ROS: Dementia  Musculoskeletal: Positive for neck pain (resolved).  Neurological: Positive for headaches  (resolved).   Allergies  Amoxicillin-pot clavulanate; Cephalexin; and Nsaids  Home Medications   Prior to Admission medications   Medication Sig Start Date End Date Taking? Authorizing Provider  acetaminophen (MAPAP) 500 MG tablet Take 1,000 mg by mouth at bedtime.   Yes Historical Provider, MD  acetaminophen (TYLENOL) 500 MG tablet Take 1,000 mg by mouth at bedtime.   Yes Historical Provider, MD  Biotin 300 MCG TABS Take 300 mcg by mouth daily.    Yes Historical Provider, MD  donepezil (ARICEPT) 10 MG tablet Take 10 mg by mouth daily.    Yes Historical Provider, MD  escitalopram (LEXAPRO) 10 MG tablet Take 10 mg by mouth daily.   Yes Historical Provider, MD  feeding supplement (ENSURE COMPLETE) LIQD Take 237 mLs by mouth as needed (for nutritional supplementation). 05/16/12  Yes Leroy Sea, MD  ferrous sulfate 325 (65 FE) MG tablet Take 1 tablet (325 mg total) by mouth 3 (three) times daily with meals. Patient taking differently: Take 325 mg by mouth at bedtime.  05/24/12  Yes Erick Blinks, MD  levothyroxine (SYNTHROID, LEVOTHROID) 100 MCG tablet Take 2 tablets (200 mcg total) by mouth daily before breakfast. 06/08/15  Yes Elliot Cousin, MD  loperamide (IMODIUM A-D) 2 MG tablet Take 2  mg by mouth 2 (two) times daily.   Yes Historical Provider, MD  loratadine (CLARITIN) 10 MG tablet Take 10 mg by mouth daily.   Yes Historical Provider, MD  losartan (COZAAR) 50 MG tablet Take 50 mg by mouth daily.   Yes Historical Provider, MD  Lutein 6 MG CAPS Take 1 capsule by mouth daily.   Yes Historical Provider, MD  meclizine (ANTIVERT) 25 MG tablet Take 25 mg by mouth 3 (three) times daily as needed for dizziness.   Yes Historical Provider, MD  memantine (NAMENDA) 10 MG tablet Take 10 mg by mouth 2 (two) times daily.   Yes Historical Provider, MD  Multiple Vitamin (DAILY VITE) TABS Take 1 tablet by mouth daily.   Yes Historical Provider, MD  omeprazole (PRILOSEC) 20 MG capsule Take 20 mg by mouth  daily. 02/02/15  Yes Historical Provider, MD  Polyethyl Glycol-Propyl Glycol (SYSTANE OP) Apply 1 drop to eye 4 (four) times daily.   Yes Historical Provider, MD  levofloxacin (LEVAQUIN) 500 MG tablet Take 1 tablet (500 mg total) by mouth daily. Antibiotic to be taken for 5 more days, starting on 06/09/15. Patient not taking: Reported on 06/20/2015 06/08/15   Elliot Cousin, MD   BP 158/62 mmHg  Pulse 60  Temp(Src) 98 F (36.7 C) (Oral)  Resp 20  SpO2 94%  Vital signs normal   Physical Exam  Constitutional:  Non-toxic appearance. She does not appear ill. No distress.  Frail, pleasant, elderly female.  HENT:  Head: Normocephalic and atraumatic.  Right Ear: External ear normal.  Left Ear: External ear normal.  Nose: Nose normal. No mucosal edema or rhinorrhea.  Mouth/Throat: Oropharynx is clear and moist and mucous membranes are normal. No dental abscesses or uvula swelling.  Eyes: Conjunctivae and EOM are normal. Pupils are equal, round, and reactive to light.  Neck: Normal range of motion and full passive range of motion without pain. Neck supple.  Cardiovascular: Normal rate and regular rhythm.  Exam reveals no gallop and no friction rub.   Murmur heard. 2/6 systolic heart murmur.   Pulmonary/Chest: Effort normal and breath sounds normal. No respiratory distress. She has no wheezes. She has no rhonchi. She has no rales. She exhibits no tenderness and no crepitus.  Abdominal: Soft. Normal appearance and bowel sounds are normal. She exhibits no distension. There is no tenderness. There is no rebound and no guarding.  Musculoskeletal: Normal range of motion. She exhibits no edema or tenderness.       Back:  Moves all extremities well without discomfort. Tender over lower sacral region midline. Otherwise spine non-tender.  Neurological: She is alert. She has normal strength. No cranial nerve deficit.  cooperative  Skin: Skin is warm, dry and intact. No rash noted. No erythema. No  pallor.  Psychiatric: She has a normal mood and affect. Her speech is normal and behavior is normal. Her mood appears not anxious.  Nursing note and vitals reviewed.   ED Course  Procedures (including critical care time)  DIAGNOSTIC STUDIES: Oxygen Saturation is 95% on RA, adequate by my interpretation.    COORDINATION OF CARE: 11:50 PM - XR findings reviewed with pt's family. Discussed treatment plan with family at bedside which includes attempt to stand to make sure she doesn't have any other painful areas. . Pt's family verbalized understanding and agreed to plan.   Nursing staff states patient was able to stand however she was dizzy and lightheaded. She denies having any pain when she stood up. At  that point we decided to do a urinalysis to make sure she did not have a UTI which may have contributed to her fall.  Patient's urinalysis is normal. She was discharged back to her facility.   Results for orders placed or performed during the hospital encounter of 06/20/15  Urinalysis, Routine w reflex microscopic (not at [facility])  Result Value Ref Range   Color, Urine YELLOW YELLOW   APPearance CLEAR CLEAR   Specific Gravity, Urine 1.010 1.005 - 1.030   pH 7.0 5.0 - 8.0   Glucose, UA NEGATIVE NEGATIVE mg/dL   Hgb urine dipstick NEGATIVE NEGATIVE   Bilirubin Urine NEGATIVE NEGATIVE   Ketones, ur NEGATIVE NEGATIVE mg/dL   Protein, ur NEGATIVE NEGATIVE mg/dL   Urobilinogen, UA 0.2 0.0 - 1.0 mg/dL   Nitrite NEGATIVE NEGATIVE   Leukocytes, UA NEGATIVE NEGATIVE   Laboratory interpretation all normal    Imaging Review Dg Lumbar Spine Complete  06/20/2015  CLINICAL DATA:  Pain following fall EXAM: LUMBAR SPINE - COMPLETE 4+ VIEW COMPARISON:  June 04, 2009 FINDINGS: Frontal, lateral, spot lumbosacral lateral, and bilateral oblique views were obtained. There are 5 non-rib-bearing lumbar type vertebral bodies. There is mild dextroscoliosis. There is superior endplate compression at L1,  stable. No new fracture. There is stable 5 mm of anterolisthesis of L5 on S1. No new spondylolisthesis. There is marked disc space narrowing at L4-5. There is moderate disc space narrowing at L5-S1. There is facet osteoarthritic change at L4-5 and L5-S1 bilaterally. IMPRESSION: Stable wedging of the superior endplate of the L1 vertebral body. No new fracture. Stable mild spondylolisthesis at L5-S1. No new spondylolisthesis. Stable arthropathy, most marked at L4-5 and L5-S1. Electronically Signed   By: Bretta BangWilliam  Woodruff III M.D.   On: 06/20/2015 23:00   Ct Head Wo Contrast  Ct Cervical Spine Wo Contrast  06/20/2015  CLINICAL DATA:  Status post fall, head injury. EXAM: CT HEAD WITHOUT CONTRAST CT CERVICAL SPINE WITHOUT CONTRAST TECHNIQUE: Multidetector CT imaging of the head and cervical spine was performed following the standard protocol without intravenous contrast. Multiplanar CT image reconstructions of the cervical spine were also generated. COMPARISON:  Head CT and cervical spine CT dated 08/11/2014. FINDINGS: CT HEAD FINDINGS Again noted is generalized brain atrophy with commensurate dilatation of the ventricles and sulci. Mild chronic small vessel ischemic change again noted within the deep periventricular white matter regions bilaterally. There is no mass, hemorrhage, edema, or other evidence of acute parenchymal abnormality. No extra-axial hemorrhage. No osseous fracture or dislocations seen. Visualized upper paranasal sinuses are clear. Mastoid air cells are clear. Perhaps mild soft tissue edema overlying the left frontal bone, without frank hematoma. CT CERVICAL SPINE FINDINGS Degenerative changes are seen throughout the scoliotic cervical spine, at least moderate in degree throughout, with associated disc space narrowings and osseous spurring. No more than mild central canal stenosis seen at any level. There is no fracture line or displaced fracture fragment identified. Facet joints appear well  aligned throughout. Atherosclerotic calcifications are seen at the left carotid bulb region. Paravertebral soft tissues otherwise unremarkable. IMPRESSION: 1. No evidence of acute intracranial abnormality. No intracranial mass, hemorrhage, or edema. No fracture. Atrophy and chronic ischemic changes within the white matter. 2. No fracture or acute subluxation within the cervical spine. Extensive degenerative change throughout the cervical spine, but no more than mild central canal stenosis seen at any level. Electronically Signed   By: Bary RichardStan  Maynard M.D.   On: 06/20/2015 23:20   I have personally reviewed  and evaluated these images and lab results as part of my medical decision-making.  MDM   Final diagnoses:  Fall at nursing home, initial encounter  Midline low back pain without sciatica   Plan discharge  Devoria Albe, MD, FACEP   I personally performed the services described in this documentation, which was scribed in my presence. The recorded information has been reviewed and considered.  Devoria Albe, MD, Armando Gang     Devoria Albe, MD 06/21/15 [phone]

## 2015-06-20 NOTE — ED Provider Notes (Signed)
MSE was initiated and I personally evaluated the patient and placed orders (if any) at  10:27 PM on June 20, 2015.  The patient appears stable so that the remainder of the MSE may be completed by another provider.  Patient referred from nursing facility for fall, per reports she hit her head the patient has no recollection of this event showing complains of lower back pain. Patient is alert and oriented 3, mild lower lumbar tenderness. CT head, cspine, lumbar plain films ordered.    Tilden FossaElizabeth Wrangler Penning, MD 06/20/15 2227

## 2015-06-21 DIAGNOSIS — S3992XA Unspecified injury of lower back, initial encounter: Secondary | ICD-10-CM | POA: Diagnosis not present

## 2015-06-21 LAB — URINALYSIS, ROUTINE W REFLEX MICROSCOPIC
BILIRUBIN URINE: NEGATIVE
Glucose, UA: NEGATIVE mg/dL
Hgb urine dipstick: NEGATIVE
KETONES UR: NEGATIVE mg/dL
LEUKOCYTES UA: NEGATIVE
NITRITE: NEGATIVE
PROTEIN: NEGATIVE mg/dL
Specific Gravity, Urine: 1.01 (ref 1.005–1.030)
Urobilinogen, UA: 0.2 mg/dL (ref 0.0–1.0)
pH: 7 (ref 5.0–8.0)

## 2015-06-21 NOTE — Discharge Instructions (Signed)
Your xrays do not show anything new. You have some underling arthritis which is to be expected. Your urinalysis test was normal, no infection.  Recheck as needed.

## 2015-06-21 NOTE — ED Notes (Signed)
Discharge instructions given, pt demonstrated teach back and verbal understanding. No concerns voiced.  

## 2015-06-21 NOTE — ED Notes (Signed)
Patient unable to ambulate, complain of  dizzy and unstable

## 2015-06-21 NOTE — ED Notes (Signed)
Nurse tech attempted to ambulate pt. Stated pt was to weak & dizzy to walk. Tech notified EDP.

## 2015-06-22 DIAGNOSIS — Z6820 Body mass index (BMI) 20.0-20.9, adult: Secondary | ICD-10-CM | POA: Diagnosis not present

## 2015-06-22 DIAGNOSIS — E039 Hypothyroidism, unspecified: Secondary | ICD-10-CM | POA: Diagnosis not present

## 2015-06-22 DIAGNOSIS — Z1389 Encounter for screening for other disorder: Secondary | ICD-10-CM | POA: Diagnosis not present

## 2015-06-22 DIAGNOSIS — J181 Lobar pneumonia, unspecified organism: Secondary | ICD-10-CM | POA: Diagnosis not present

## 2015-06-23 DIAGNOSIS — J189 Pneumonia, unspecified organism: Secondary | ICD-10-CM | POA: Diagnosis not present

## 2015-06-23 DIAGNOSIS — I129 Hypertensive chronic kidney disease with stage 1 through stage 4 chronic kidney disease, or unspecified chronic kidney disease: Secondary | ICD-10-CM | POA: Diagnosis not present

## 2015-06-23 DIAGNOSIS — N183 Chronic kidney disease, stage 3 (moderate): Secondary | ICD-10-CM | POA: Diagnosis not present

## 2015-06-23 DIAGNOSIS — D649 Anemia, unspecified: Secondary | ICD-10-CM | POA: Diagnosis not present

## 2015-06-23 DIAGNOSIS — M150 Primary generalized (osteo)arthritis: Secondary | ICD-10-CM | POA: Diagnosis not present

## 2015-06-23 DIAGNOSIS — M25551 Pain in right hip: Secondary | ICD-10-CM | POA: Diagnosis not present

## 2015-06-25 DIAGNOSIS — M25551 Pain in right hip: Secondary | ICD-10-CM | POA: Diagnosis not present

## 2015-06-25 DIAGNOSIS — D649 Anemia, unspecified: Secondary | ICD-10-CM | POA: Diagnosis not present

## 2015-06-25 DIAGNOSIS — J189 Pneumonia, unspecified organism: Secondary | ICD-10-CM | POA: Diagnosis not present

## 2015-06-25 DIAGNOSIS — N183 Chronic kidney disease, stage 3 (moderate): Secondary | ICD-10-CM | POA: Diagnosis not present

## 2015-06-25 DIAGNOSIS — I129 Hypertensive chronic kidney disease with stage 1 through stage 4 chronic kidney disease, or unspecified chronic kidney disease: Secondary | ICD-10-CM | POA: Diagnosis not present

## 2015-06-25 DIAGNOSIS — M150 Primary generalized (osteo)arthritis: Secondary | ICD-10-CM | POA: Diagnosis not present

## 2015-06-26 DIAGNOSIS — M25551 Pain in right hip: Secondary | ICD-10-CM | POA: Diagnosis not present

## 2015-06-26 DIAGNOSIS — M150 Primary generalized (osteo)arthritis: Secondary | ICD-10-CM | POA: Diagnosis not present

## 2015-06-26 DIAGNOSIS — D649 Anemia, unspecified: Secondary | ICD-10-CM | POA: Diagnosis not present

## 2015-06-26 DIAGNOSIS — I129 Hypertensive chronic kidney disease with stage 1 through stage 4 chronic kidney disease, or unspecified chronic kidney disease: Secondary | ICD-10-CM | POA: Diagnosis not present

## 2015-06-26 DIAGNOSIS — J189 Pneumonia, unspecified organism: Secondary | ICD-10-CM | POA: Diagnosis not present

## 2015-06-26 DIAGNOSIS — N183 Chronic kidney disease, stage 3 (moderate): Secondary | ICD-10-CM | POA: Diagnosis not present

## 2015-06-30 DIAGNOSIS — D649 Anemia, unspecified: Secondary | ICD-10-CM | POA: Diagnosis not present

## 2015-06-30 DIAGNOSIS — J189 Pneumonia, unspecified organism: Secondary | ICD-10-CM | POA: Diagnosis not present

## 2015-06-30 DIAGNOSIS — M150 Primary generalized (osteo)arthritis: Secondary | ICD-10-CM | POA: Diagnosis not present

## 2015-06-30 DIAGNOSIS — N183 Chronic kidney disease, stage 3 (moderate): Secondary | ICD-10-CM | POA: Diagnosis not present

## 2015-06-30 DIAGNOSIS — M25551 Pain in right hip: Secondary | ICD-10-CM | POA: Diagnosis not present

## 2015-06-30 DIAGNOSIS — I129 Hypertensive chronic kidney disease with stage 1 through stage 4 chronic kidney disease, or unspecified chronic kidney disease: Secondary | ICD-10-CM | POA: Diagnosis not present

## 2015-07-01 DIAGNOSIS — J189 Pneumonia, unspecified organism: Secondary | ICD-10-CM | POA: Diagnosis not present

## 2015-07-01 DIAGNOSIS — I129 Hypertensive chronic kidney disease with stage 1 through stage 4 chronic kidney disease, or unspecified chronic kidney disease: Secondary | ICD-10-CM | POA: Diagnosis not present

## 2015-07-01 DIAGNOSIS — M150 Primary generalized (osteo)arthritis: Secondary | ICD-10-CM | POA: Diagnosis not present

## 2015-07-01 DIAGNOSIS — N183 Chronic kidney disease, stage 3 (moderate): Secondary | ICD-10-CM | POA: Diagnosis not present

## 2015-07-01 DIAGNOSIS — M25551 Pain in right hip: Secondary | ICD-10-CM | POA: Diagnosis not present

## 2015-07-01 DIAGNOSIS — D649 Anemia, unspecified: Secondary | ICD-10-CM | POA: Diagnosis not present

## 2015-07-03 DIAGNOSIS — N183 Chronic kidney disease, stage 3 (moderate): Secondary | ICD-10-CM | POA: Diagnosis not present

## 2015-07-03 DIAGNOSIS — J189 Pneumonia, unspecified organism: Secondary | ICD-10-CM | POA: Diagnosis not present

## 2015-07-03 DIAGNOSIS — M150 Primary generalized (osteo)arthritis: Secondary | ICD-10-CM | POA: Diagnosis not present

## 2015-07-03 DIAGNOSIS — D649 Anemia, unspecified: Secondary | ICD-10-CM | POA: Diagnosis not present

## 2015-07-03 DIAGNOSIS — M25551 Pain in right hip: Secondary | ICD-10-CM | POA: Diagnosis not present

## 2015-07-03 DIAGNOSIS — I129 Hypertensive chronic kidney disease with stage 1 through stage 4 chronic kidney disease, or unspecified chronic kidney disease: Secondary | ICD-10-CM | POA: Diagnosis not present

## 2015-07-07 DIAGNOSIS — I129 Hypertensive chronic kidney disease with stage 1 through stage 4 chronic kidney disease, or unspecified chronic kidney disease: Secondary | ICD-10-CM | POA: Diagnosis not present

## 2015-07-07 DIAGNOSIS — M25551 Pain in right hip: Secondary | ICD-10-CM | POA: Diagnosis not present

## 2015-07-07 DIAGNOSIS — N183 Chronic kidney disease, stage 3 (moderate): Secondary | ICD-10-CM | POA: Diagnosis not present

## 2015-07-07 DIAGNOSIS — M150 Primary generalized (osteo)arthritis: Secondary | ICD-10-CM | POA: Diagnosis not present

## 2015-07-07 DIAGNOSIS — D649 Anemia, unspecified: Secondary | ICD-10-CM | POA: Diagnosis not present

## 2015-07-07 DIAGNOSIS — J189 Pneumonia, unspecified organism: Secondary | ICD-10-CM | POA: Diagnosis not present

## 2015-07-09 DIAGNOSIS — D649 Anemia, unspecified: Secondary | ICD-10-CM | POA: Diagnosis not present

## 2015-07-09 DIAGNOSIS — J189 Pneumonia, unspecified organism: Secondary | ICD-10-CM | POA: Diagnosis not present

## 2015-07-09 DIAGNOSIS — N183 Chronic kidney disease, stage 3 (moderate): Secondary | ICD-10-CM | POA: Diagnosis not present

## 2015-07-09 DIAGNOSIS — M25551 Pain in right hip: Secondary | ICD-10-CM | POA: Diagnosis not present

## 2015-07-09 DIAGNOSIS — M150 Primary generalized (osteo)arthritis: Secondary | ICD-10-CM | POA: Diagnosis not present

## 2015-07-09 DIAGNOSIS — I129 Hypertensive chronic kidney disease with stage 1 through stage 4 chronic kidney disease, or unspecified chronic kidney disease: Secondary | ICD-10-CM | POA: Diagnosis not present

## 2015-07-10 DIAGNOSIS — I129 Hypertensive chronic kidney disease with stage 1 through stage 4 chronic kidney disease, or unspecified chronic kidney disease: Secondary | ICD-10-CM | POA: Diagnosis not present

## 2015-07-10 DIAGNOSIS — N183 Chronic kidney disease, stage 3 (moderate): Secondary | ICD-10-CM | POA: Diagnosis not present

## 2015-07-10 DIAGNOSIS — M25551 Pain in right hip: Secondary | ICD-10-CM | POA: Diagnosis not present

## 2015-07-10 DIAGNOSIS — J189 Pneumonia, unspecified organism: Secondary | ICD-10-CM | POA: Diagnosis not present

## 2015-07-10 DIAGNOSIS — D649 Anemia, unspecified: Secondary | ICD-10-CM | POA: Diagnosis not present

## 2015-07-10 DIAGNOSIS — M150 Primary generalized (osteo)arthritis: Secondary | ICD-10-CM | POA: Diagnosis not present

## 2015-08-04 ENCOUNTER — Encounter: Payer: Self-pay | Admitting: Endocrinology

## 2015-08-04 ENCOUNTER — Ambulatory Visit (INDEPENDENT_AMBULATORY_CARE_PROVIDER_SITE_OTHER): Payer: Medicare Other | Admitting: Endocrinology

## 2015-08-04 ENCOUNTER — Telehealth: Payer: Self-pay | Admitting: Endocrinology

## 2015-08-04 VITALS — BP 116/68 | HR 64 | Temp 98.2°F | Resp 14 | Ht 61.0 in | Wt 106.2 lb

## 2015-08-04 DIAGNOSIS — E063 Autoimmune thyroiditis: Secondary | ICD-10-CM

## 2015-08-04 DIAGNOSIS — E038 Other specified hypothyroidism: Secondary | ICD-10-CM | POA: Diagnosis not present

## 2015-08-04 LAB — TSH: TSH: 9.25 u[IU]/mL — AB (ref 0.35–4.50)

## 2015-08-04 LAB — T4, FREE: Free T4: 0.63 ng/dL (ref 0.60–1.60)

## 2015-08-04 NOTE — Progress Notes (Signed)
Patient ID: [name], female   DOB: 1920/08/02, 81 y.o.   MRN: [id number]   Reason for Appointment:  Hypothyroidism, f/u visit   History of Present Illness:    HYPOTHYROIDISM  was first diagnosed in 1948 when she had thyroidectomy for Graves' disease   The symptoms consistent with hypothyroidism  previously were: feeling sleepy during the day despite sleeping well at night and needing naps. She also has had some dry skin but no significant weight gain or cold intolerance. She has had some hair loss which is not new  She had been referred here because of inadequate control of her hypothyroidism despite increasing her Synthroid doses progressively  Since she was getting iron and calcium tablets in the morning with her Synthroid the timing of these was changed on visit in 02/2013.  She is now taking her Synthroid separately at 7 AM She is taking her calcium supplement at lunch and iron at bedtime  This was however not verified on her [facility] today and this was not available Her Synthroid dose has been adjusted periodically since her initial visit and appears to be requiring progressively less doses now  On her last visit in 6/16  her Synthroid dosage was reduced down to 2 tablets of the 112 g as TSH had been relatively low Although she did have a low TSH in the hospital when she was admitted in October for a fall there was no change made  She is now here for follow-up  She is complaining about feeling sleepy during the day especially if she is sitting down and not active.  Otherwise is able to do her usual activities and move around. She thinks she is sleeping fairly well at night Again difficult to get a good history because of her dementia.  She has lost a little weight Does not complain of cold intolerance anymore than usual, she does get coldness in her feet  Wt Readings from Last 3 Encounters:  08/04/15 106 lb 3.2 oz (48.172 kg)  06/07/15 109 lb 11.2 oz (49.76 kg)    02/05/15 109 lb (49.442 kg)   Previous TSH levels: on 02/15/13;  result was 21.34 TSH in 9/15 was 0.265  Lab Results  Component Value Date   TSH 0.192* 06/06/2015   TSH 0.36 02/05/2015   FREET4 1.08 02/05/2015   FREET4 1.05 08/07/2014          Past Medical History  Diagnosis Date  . Thyroid disease   . Hypertension   . Dementia   . Anemia   . Gastric ulcer     gi bleed secondary  . Renal disorder   . Vertigo     Past Surgical History  Procedure Laterality Date  . Thyroid surgery    . Mastectomy    . Carpal tunnel release    . Abdominal hysterectomy    . Femur im nail  05/13/2012    Procedure: INTRAMEDULLARY (IM) NAIL FEMORAL;  Surgeon: Senaida Lange, MD;  Location: [facility] OR;  Service: Orthopedics;  Laterality: Right;  . Fracture surgery      Family History  Problem Relation Age of Onset  . Thyroid disease Neg Hx    Social History:  reports that she has never smoked. She does not have any smokeless tobacco history on file. She reports that she does not drink alcohol or use illicit drugs.  Allergies:  Allergies  Allergen Reactions  . Amoxicillin-Pot Clavulanate     REACTION: UNKNOWN REACTION  .  Cephalexin     REACTION: UNKNOWN REACTION  . Nsaids     UNKNOWN REACTION      Medication List       This list is accurate as of: 08/04/15 10:18 AM.  Always use your most recent med list.               MAPAP 500 MG tablet  Generic drug:  acetaminophen  Take 1,000 mg by mouth at bedtime.     acetaminophen 500 MG tablet  Commonly known as:  TYLENOL  Take 1,000 mg by mouth at bedtime.     Biotin 300 MCG Tabs  Take 300 mcg by mouth daily.     DAILY VITE Tabs  Take 1 tablet by mouth daily.     donepezil 10 MG tablet  Commonly known as:  ARICEPT  Take 10 mg by mouth daily.     escitalopram 10 MG tablet  Commonly known as:  LEXAPRO  Take 10 mg by mouth daily.     feeding supplement (ENSURE COMPLETE) Liqd  Take 237 mLs by mouth as needed (for  nutritional supplementation).     ferrous sulfate 325 (65 FE) MG tablet  Take 1 tablet (325 mg total) by mouth 3 (three) times daily with meals.     levofloxacin 500 MG tablet  Commonly known as:  LEVAQUIN  Take 1 tablet (500 mg total) by mouth daily. Antibiotic to be taken for 5 more days, starting on 06/09/15.     levothyroxine 112 MCG tablet  Commonly known as:  SYNTHROID, LEVOTHROID  Take 112 mcg by mouth daily. Take 2 tablets daily     loperamide 2 MG tablet  Commonly known as:  IMODIUM A-D  Take 2 mg by mouth 2 (two) times daily.     loratadine 10 MG tablet  Commonly known as:  CLARITIN  Take 10 mg by mouth daily.     losartan 50 MG tablet  Commonly known as:  COZAAR  Take 50 mg by mouth daily.     Lutein 6 MG Caps  Take 1 capsule by mouth daily.     meclizine 25 MG tablet  Commonly known as:  ANTIVERT  Take 25 mg by mouth 3 (three) times daily as needed for dizziness.     memantine 10 MG tablet  Commonly known as:  NAMENDA  Take 10 mg by mouth 2 (two) times daily.     omeprazole 20 MG capsule  Commonly known as:  PRILOSEC  Take 20 mg by mouth daily.     SYSTANE OP  Apply 1 drop to eye 4 (four) times daily.        Review of Systems:            Patient has long-standing history of dementia Has positive history of depression treated with Celexa   History of Iron deficiency anemia        Examination:    BP 116/68 mmHg  Pulse 64  Temp(Src) 98.2 F (36.8 C)  Resp 14  Ht 5\' 1"  (1.549 m)  Wt 106 lb 3.2 oz (48.172 kg)  BMI 20.08 kg/m2  SpO2 95%  She is  pleasant, alert, conversing normally, looks well          No puffiness of the face or eyes         Neck: The thyroid is nonpalpable.  Deep tendon reflexes : at biceps are normal.       Peripheral  skin appears normal.  Assessments .  Postsurgical Hypothyroidism, long-standing  She has been symptomatically better since adequately supplementing her thyroid after initial consultation in  2014 She  also benefited significantly with changing her Synthroid and OTC supplement timing to avoid interaction with her calcium and iron  Currently she appears to be doing well and clinically looks euthyroid Currently taking 224 g daily   Although her TSH was relatively low in the hospital in October will need to check it again today to ensure that it is unchanged  To decide dosage of Synthroid based on TSH level today, followup in 6 months unless another dose changes needed  [name] 08/04/2015, 10:18 AM   Addendum: TSH is back up to 9.25 Will need to verify timing of her multivitamins and iron before making a change

## 2015-08-04 NOTE — Patient Instructions (Signed)
Give all Vitamins, calcium and iron either at lunch or dinner

## 2015-08-04 NOTE — Progress Notes (Signed)
Quick Note:  Please find out from the nursing home to verify dose of Synthroid and also timing of her multivitamins, calcium and iron before making a change today  ______

## 2015-10-01 DIAGNOSIS — R198 Other specified symptoms and signs involving the digestive system and abdomen: Secondary | ICD-10-CM | POA: Diagnosis not present

## 2015-10-01 DIAGNOSIS — Z1389 Encounter for screening for other disorder: Secondary | ICD-10-CM | POA: Diagnosis not present

## 2015-10-01 DIAGNOSIS — Z6820 Body mass index (BMI) 20.0-20.9, adult: Secondary | ICD-10-CM | POA: Diagnosis not present

## 2015-10-05 ENCOUNTER — Encounter: Payer: Self-pay | Admitting: Endocrinology

## 2015-10-05 ENCOUNTER — Ambulatory Visit (INDEPENDENT_AMBULATORY_CARE_PROVIDER_SITE_OTHER): Payer: Medicare Other | Admitting: Endocrinology

## 2015-10-05 VITALS — BP 110/62 | HR 63 | Temp 97.7°F | Resp 14 | Ht 61.0 in | Wt 106.6 lb

## 2015-10-05 DIAGNOSIS — E038 Other specified hypothyroidism: Secondary | ICD-10-CM

## 2015-10-05 DIAGNOSIS — E063 Autoimmune thyroiditis: Secondary | ICD-10-CM

## 2015-10-05 LAB — T4, FREE: Free T4: 1.08 ng/dL (ref 0.60–1.60)

## 2015-10-05 LAB — TSH: TSH: 1.83 u[IU]/mL (ref 0.35–4.50)

## 2015-10-05 NOTE — Progress Notes (Signed)
Quick Note:  Please fax instructions to the nursing home with copy of the lab report: Continue the same instructions for Synthroid but on Sundays give only one tablet instead of 2, follow-up in 6 months ______

## 2015-10-05 NOTE — Progress Notes (Signed)
Patient ID: [name], female   DOB: 10/27/1919, 82 y.o.   MRN: [id number]   Reason for Appointment:  Hypothyroidism, f/u visit   History of Present Illness:    HYPOTHYROIDISM  was first diagnosed in 1948 when she had thyroidectomy for Graves' disease   The symptoms consistent with hypothyroidism  previously were: feeling sleepy during the day despite sleeping well at night and needing naps. She also has had some dry skin but no significant weight gain or cold intolerance. She has had some hair loss which is not new  She had been referred here because of inadequate control of her hypothyroidism despite increasing her Synthroid doses progressively  Since she was getting iron and calcium tablets in the morning with her Synthroid the timing of these was changed on visit in 02/2013.  She is now taking her Synthroid separately at 7 AM She is taking her calcium supplement at lunch and iron at bedtime  This was however not verified on her [facility] today and this was not available Her Synthroid dose has been adjusted periodically since her initial visit and appears to be requiring progressively less doses now  On her last visit in 07/2015 her TSH had gone up significantly to 9.25 and her dose was increased to 2 tablets of 125 g daily instead of 112.  Previously in October she had a low TSH during hospitalization Review of her medication record at day nursing home show that she is getting Synthroid before breakfast and getting her calcium, vitamins and iron at lunchtime  She does not think she had any change in her energy level or chronic  sleepiness with this  She is still complaining about feeling sleepy during the day especially if she is sitting down and not active.   Otherwise is able to do her usual activities and move around. No change in weight Does not complain of cold intolerance anymore than usual, she does get coldness in her feet  Wt Readings from Last 3 Encounters:    10/05/15 106 lb 9.6 oz (48.353 kg)  08/04/15 106 lb 3.2 oz (48.172 kg)  06/07/15 109 lb 11.2 oz (49.76 kg)   Previous TSH levels: on 02/15/13;  result was 21.34 TSH in 9/15 was 0.265  Lab Results  Component Value Date   TSH 1.83 10/05/2015   TSH 9.25* 08/04/2015   FREET4 1.08 10/05/2015   FREET4 0.63 08/04/2015          Past Medical History  Diagnosis Date  . Thyroid disease   . Hypertension   . Dementia   . Anemia   . Gastric ulcer     gi bleed secondary  . Renal disorder   . Vertigo     Past Surgical History  Procedure Laterality Date  . Thyroid surgery    . Mastectomy    . Carpal tunnel release    . Abdominal hysterectomy    . Femur im nail  05/13/2012    Procedure: INTRAMEDULLARY (IM) NAIL FEMORAL;  Surgeon: Senaida Lange, MD;  Location: [facility] OR;  Service: Orthopedics;  Laterality: Right;  . Fracture surgery      Family History  Problem Relation Age of Onset  . Thyroid disease Neg Hx    Social History:  reports that she has never smoked. She does not have any smokeless tobacco history on file. She reports that she does not drink alcohol or use illicit drugs.  Allergies:  Allergies  Allergen Reactions  . Amoxicillin-Pot Clavulanate  REACTION: UNKNOWN REACTION  . Cephalexin     REACTION: UNKNOWN REACTION  . Nsaids     UNKNOWN REACTION      Medication List       This list is accurate as of: 10/05/15  9:03 PM.  Always use your most recent med list.               MAPAP 500 MG tablet  Generic drug:  acetaminophen  Take 1,000 mg by mouth at bedtime.     acetaminophen 500 MG tablet  Commonly known as:  TYLENOL  Take 1,000 mg by mouth at bedtime.     Biotin 300 MCG Tabs  Take 300 mcg by mouth daily.     DAILY VITE Tabs  Take 1 tablet by mouth daily.     donepezil 10 MG tablet  Commonly known as:  ARICEPT  Take 10 mg by mouth daily.     escitalopram 10 MG tablet  Commonly known as:  LEXAPRO  Take 10 mg by mouth daily.     feeding  supplement (ENSURE COMPLETE) Liqd  Take 237 mLs by mouth as needed (for nutritional supplementation).     ferrous sulfate 325 (65 FE) MG tablet  Take 1 tablet (325 mg total) by mouth 3 (three) times daily with meals.     levothyroxine 112 MCG tablet  Commonly known as:  SYNTHROID, LEVOTHROID  Take 112 mcg by mouth daily. Take 2 tablets daily     loperamide 2 MG tablet  Commonly known as:  IMODIUM A-D  Take 2 mg by mouth 2 (two) times daily.     loratadine 10 MG tablet  Commonly known as:  CLARITIN  Take 10 mg by mouth daily.     losartan 50 MG tablet  Commonly known as:  COZAAR  Take 50 mg by mouth daily.     Lutein 6 MG Caps  Take 1 capsule by mouth daily.     meclizine 25 MG tablet  Commonly known as:  ANTIVERT  Take 25 mg by mouth 3 (three) times daily as needed for dizziness.     memantine 10 MG tablet  Commonly known as:  NAMENDA  Take 10 mg by mouth 2 (two) times daily.     omeprazole 20 MG capsule  Commonly known as:  PRILOSEC  Take 20 mg by mouth daily.     SYSTANE OP  Apply 1 drop to eye 4 (four) times daily.        Review of Systems:            Patient has long-standing history of dementia Has positive history of depression treated with Celexa   History of Iron deficiency anemia        Examination:    BP 110/62 mmHg  Pulse 63  Temp(Src) 97.7 F (36.5 C)  Resp 14  Ht 5\' 1"  (1.549 m)  Wt 106 lb 9.6 oz (48.353 kg)  BMI 20.15 kg/m2  SpO2 95%  She is  pleasant, alert, looks well          No puffiness of the face or eyes         Neck: The thyroid is not enlarged.  Deep tendon reflexes : at biceps are normal.     Her hands are relatively warm  Assessments .   Postsurgical Hypothyroidism, long-standing  She is still requiring relatively large doses of thyroid supplementation despite her low weight Although subjectively she has tendency to feeling sleepy in the daytime  and cold extremities  this does not correlate with her thyroid  levels  Currently she appears to be doing well and clinically looks euthyroid Currently taking 250 g daily and separately from iron and calcium    To decide dosage of Synthroid based on TSH level today, followup in 6 months unless another dose changes needed  Christen Wardrop 10/05/2015, 9:03 PM   Addendum: TSH is low normal for her age and will have her take 1 less tablet per week

## 2015-10-21 DIAGNOSIS — M79674 Pain in right toe(s): Secondary | ICD-10-CM | POA: Diagnosis not present

## 2015-10-21 DIAGNOSIS — B351 Tinea unguium: Secondary | ICD-10-CM | POA: Diagnosis not present

## 2015-10-21 DIAGNOSIS — M79675 Pain in left toe(s): Secondary | ICD-10-CM | POA: Diagnosis not present

## 2015-11-05 DIAGNOSIS — H353213 Exudative age-related macular degeneration, right eye, with inactive scar: Secondary | ICD-10-CM | POA: Diagnosis not present

## 2015-11-05 DIAGNOSIS — H3562 Retinal hemorrhage, left eye: Secondary | ICD-10-CM | POA: Diagnosis not present

## 2015-11-05 DIAGNOSIS — H353132 Nonexudative age-related macular degeneration, bilateral, intermediate dry stage: Secondary | ICD-10-CM | POA: Diagnosis not present

## 2015-11-12 DIAGNOSIS — H04123 Dry eye syndrome of bilateral lacrimal glands: Secondary | ICD-10-CM | POA: Diagnosis not present

## 2015-11-12 DIAGNOSIS — Z0100 Encounter for examination of eyes and vision without abnormal findings: Secondary | ICD-10-CM | POA: Diagnosis not present

## 2015-11-12 DIAGNOSIS — H353213 Exudative age-related macular degeneration, right eye, with inactive scar: Secondary | ICD-10-CM | POA: Diagnosis not present

## 2015-11-12 DIAGNOSIS — H3562 Retinal hemorrhage, left eye: Secondary | ICD-10-CM | POA: Diagnosis not present

## 2016-01-06 DIAGNOSIS — B351 Tinea unguium: Secondary | ICD-10-CM | POA: Diagnosis not present

## 2016-01-06 DIAGNOSIS — M79674 Pain in right toe(s): Secondary | ICD-10-CM | POA: Diagnosis not present

## 2016-01-06 DIAGNOSIS — M79675 Pain in left toe(s): Secondary | ICD-10-CM | POA: Diagnosis not present

## 2016-01-14 DIAGNOSIS — Z0000 Encounter for general adult medical examination without abnormal findings: Secondary | ICD-10-CM | POA: Diagnosis not present

## 2016-01-14 DIAGNOSIS — Z681 Body mass index (BMI) 19 or less, adult: Secondary | ICD-10-CM | POA: Diagnosis not present

## 2016-01-14 DIAGNOSIS — Z1389 Encounter for screening for other disorder: Secondary | ICD-10-CM | POA: Diagnosis not present

## 2016-01-21 DIAGNOSIS — D519 Vitamin B12 deficiency anemia, unspecified: Secondary | ICD-10-CM | POA: Diagnosis not present

## 2016-01-21 DIAGNOSIS — M1991 Primary osteoarthritis, unspecified site: Secondary | ICD-10-CM | POA: Diagnosis not present

## 2016-01-21 DIAGNOSIS — E538 Deficiency of other specified B group vitamins: Secondary | ICD-10-CM | POA: Diagnosis not present

## 2016-01-21 DIAGNOSIS — R5383 Other fatigue: Secondary | ICD-10-CM | POA: Diagnosis not present

## 2016-01-21 DIAGNOSIS — R634 Abnormal weight loss: Secondary | ICD-10-CM | POA: Diagnosis not present

## 2016-01-21 DIAGNOSIS — Z681 Body mass index (BMI) 19 or less, adult: Secondary | ICD-10-CM | POA: Diagnosis not present

## 2016-01-21 DIAGNOSIS — I10 Essential (primary) hypertension: Secondary | ICD-10-CM | POA: Diagnosis not present

## 2016-01-21 DIAGNOSIS — E039 Hypothyroidism, unspecified: Secondary | ICD-10-CM | POA: Diagnosis not present

## 2016-02-11 DIAGNOSIS — L259 Unspecified contact dermatitis, unspecified cause: Secondary | ICD-10-CM | POA: Diagnosis not present

## 2016-02-11 DIAGNOSIS — L299 Pruritus, unspecified: Secondary | ICD-10-CM | POA: Diagnosis not present

## 2016-02-11 DIAGNOSIS — Z681 Body mass index (BMI) 19 or less, adult: Secondary | ICD-10-CM | POA: Diagnosis not present

## 2016-02-12 ENCOUNTER — Emergency Department (HOSPITAL_COMMUNITY): Payer: Medicare Other

## 2016-02-12 ENCOUNTER — Emergency Department (HOSPITAL_COMMUNITY)
Admission: EM | Admit: 2016-02-12 | Discharge: 2016-02-13 | Disposition: A | Discharge status: Home / Self Care | Payer: Medicare Other | Source: Home / Self Care | Attending: Emergency Medicine | Admitting: Emergency Medicine

## 2016-02-12 ENCOUNTER — Encounter (HOSPITAL_COMMUNITY): Payer: Self-pay

## 2016-02-12 DIAGNOSIS — S098XXA Other specified injuries of head, initial encounter: Secondary | ICD-10-CM | POA: Diagnosis not present

## 2016-02-12 DIAGNOSIS — S0990XA Unspecified injury of head, initial encounter: Secondary | ICD-10-CM | POA: Insufficient documentation

## 2016-02-12 DIAGNOSIS — Y929 Unspecified place or not applicable: Secondary | ICD-10-CM

## 2016-02-12 DIAGNOSIS — G4489 Other headache syndrome: Secondary | ICD-10-CM | POA: Diagnosis not present

## 2016-02-12 DIAGNOSIS — S39012A Strain of muscle, fascia and tendon of lower back, initial encounter: Secondary | ICD-10-CM

## 2016-02-12 DIAGNOSIS — Y999 Unspecified external cause status: Secondary | ICD-10-CM

## 2016-02-12 DIAGNOSIS — W1800XA Striking against unspecified object with subsequent fall, initial encounter: Secondary | ICD-10-CM

## 2016-02-12 DIAGNOSIS — Y939 Activity, unspecified: Secondary | ICD-10-CM

## 2016-02-12 DIAGNOSIS — I10 Essential (primary) hypertension: Secondary | ICD-10-CM | POA: Insufficient documentation

## 2016-02-12 DIAGNOSIS — S199XXA Unspecified injury of neck, initial encounter: Secondary | ICD-10-CM | POA: Diagnosis not present

## 2016-02-12 DIAGNOSIS — W19XXXA Unspecified fall, initial encounter: Secondary | ICD-10-CM

## 2016-02-12 DIAGNOSIS — S3992XA Unspecified injury of lower back, initial encounter: Secondary | ICD-10-CM | POA: Diagnosis not present

## 2016-02-12 NOTE — ED Provider Notes (Signed)
CSN: [id number]     Arrival date & time 02/12/16  2052 History  By signing my name below, I, Ronney LionSuzanne Le, attest that this documentation has been prepared under the direction and in the presence of Vanetta MuldersScott Marliss Buttacavoli, MD. Electronically Signed: Ronney LionSuzanne Le, ED Scribe. 02/12/2016. 10:26 PM.    Chief Complaint  Patient presents with  . Fall   Patient is a 82 y.o. female presenting with back pain. The history is provided by the patient. No language interpreter was used.  Back Pain Location:  Lumbar spine Pain severity:  Mild Onset quality:  Gradual Duration:  3 hours Timing:  Constant Context: falling   Relieved by:  Nothing Worsened by:  Nothing tried Ineffective treatments:  None tried Associated symptoms: headaches   Associated symptoms: no abdominal pain, no chest pain, no dysuria and no fever     HPI Comments: [name] is a 82 y.o. female with a history of thyroid disease, hypertension, dementia, anemia, renal disorder, gastric ulcer, and vertigo, who presents to the Emergency Department [facility]. Patient states she was putting things in a cabinet and fell backwards, striking her posterior head. She denies LOC. Patient complains of 2/10 lower back pain and mild headache pain since the fall. Patient also complains of a rash that was present before her fall, although she notes she is currently receiving medical attention for her rash from her PCP. She denies fevers, chills, visual changes, cough, rhinorrhea, sore throat, chest pain, SOB, abdominal pain, nausea, vomiting, diarrhea, dysuria, leg swelling, or bleeding easily.   Past Medical History  Diagnosis Date  . Thyroid disease   . Hypertension   . Dementia   . Anemia   . Gastric ulcer     gi bleed secondary  . Renal disorder   . Vertigo    Past Surgical History  Procedure Laterality Date  . Thyroid surgery    . Mastectomy    . Carpal tunnel release    . Abdominal  hysterectomy    . Femur im nail  05/13/2012    Procedure: INTRAMEDULLARY (IM) NAIL FEMORAL;  Surgeon: Senaida LangeKevin M Supple, MD;  Location: [facility] OR;  Service: Orthopedics;  Laterality: Right;  . Fracture surgery     Family History  Problem Relation Age of Onset  . Thyroid disease Neg Hx    Social History  Substance Use Topics  . Smoking status: Never Smoker   . Smokeless tobacco: None  . Alcohol Use: No   OB History    No data available     Review of Systems  Constitutional: Negative for fever and chills.  HENT: Negative for rhinorrhea and sore throat.   Eyes: Negative for visual disturbance.  Respiratory: Negative for cough and shortness of breath.   Cardiovascular: Negative for chest pain and leg swelling.  Gastrointestinal: Negative for nausea, vomiting, abdominal pain and diarrhea.  Genitourinary: Negative for dysuria.  Musculoskeletal: Positive for back pain.  Skin: Positive for rash.  Neurological: Positive for headaches.  Hematological: Does not bruise/bleed easily.      Allergies  Amoxicillin-pot clavulanate; Cephalexin; and Nsaids  Home Medications   Prior to Admission medications   Medication Sig Start Date End Date Taking? Authorizing Provider  acetaminophen (MAPAP) 325 MG tablet Take 650 mg by mouth every 4 (four) hours as needed for mild pain or moderate pain.   Yes Historical Provider, MD  acetaminophen (MAPAP) 500 MG tablet Take 1,000 mg by mouth at bedtime.  Yes Historical Provider, MD  Biotin 300 MCG TABS Take 300 mcg by mouth daily.    Yes Historical Provider, MD  diphenhydrAMINE (BENADRYL) 25 MG tablet Take 25 mg by mouth every 8 (eight) hours as needed for itching.   Yes Historical Provider, MD  donepezil (ARICEPT) 10 MG tablet Take 10 mg by mouth daily.    Yes Historical Provider, MD  escitalopram (LEXAPRO) 10 MG tablet Take 10 mg by mouth daily.   Yes Historical Provider, MD  feeding supplement (ENSURE COMPLETE) LIQD Take 237 mLs by mouth as needed (for  nutritional supplementation). 05/16/12  Yes Leroy SeaPrashant K Singh, MD  ferrous sulfate 325 (65 FE) MG tablet Take 1 tablet (325 mg total) by mouth 3 (three) times daily with meals. Patient taking differently: Take 325 mg by mouth daily.  05/24/12  Yes Erick BlinksJehanzeb Memon, MD  hydrocortisone cream 0.5 % Apply 1 application topically 2 (two) times daily.   Yes Historical Provider, MD  levothyroxine (SYNTHROID, LEVOTHROID) 125 MCG tablet Take 125-250 mcg by mouth See admin instructions. Take 2 tablets (250mcg total) on all days except on Sundays. Take 1 tablet (125mcg total) on Sundays   Yes Historical Provider, MD  loperamide (IMODIUM A-D) 2 MG tablet Take 2 mg by mouth 2 (two) times daily.   Yes Historical Provider, MD  loratadine (CLARITIN) 10 MG tablet Take 10 mg by mouth daily.   Yes Historical Provider, MD  losartan (COZAAR) 50 MG tablet Take 50 mg by mouth daily.   Yes Historical Provider, MD  Lutein 6 MG CAPS Take 1 capsule by mouth daily.   Yes Historical Provider, MD  meclizine (ANTIVERT) 25 MG tablet Take 25 mg by mouth 3 (three) times daily as needed for dizziness.   Yes Historical Provider, MD  memantine (NAMENDA) 10 MG tablet Take 10 mg by mouth 2 (two) times daily.   Yes Historical Provider, MD  Menthol, Topical Analgesic, 10 % LIQD Apply 1 application topically 2 (two) times daily as needed (for pain to bilateral elbows).   Yes Historical Provider, MD  methylPREDNISolone (MEDROL) 4 MG tablet Take 4 mg by mouth See admin instructions. For contact dermatitis   Yes Historical Provider, MD  Multiple Vitamin (DAILY VITE) TABS Take 1 tablet by mouth daily.   Yes Historical Provider, MD  omeprazole (PRILOSEC) 20 MG capsule Take 20 mg by mouth daily. 02/02/15  Yes Historical Provider, MD  Ethelda Chickyster Shell (OYSTER CALCIUM) 500 MG TABS tablet Take 500 mg of elemental calcium by mouth daily.   Yes Historical Provider, MD  Polyethyl Glycol-Propyl Glycol (SYSTANE OP) Apply 1 drop to eye 4 (four) times daily.   Yes  Historical Provider, MD  psyllium (METAMUCIL SMOOTH TEXTURE) 28 % packet Take 1 packet by mouth daily. 3g given daily. *May give additional up to three times daily as needed for constipation   Yes Historical Provider, MD   BP 164/56 mmHg  Pulse 66  Temp(Src) 98.2 F (36.8 C) (Oral)  Resp 18  SpO2 88% Physical Exam  Constitutional: She is oriented to person, place, and time. She appears well-developed and well-nourished. No distress.  HENT:  Head: Normocephalic and atraumatic.  Mouth/Throat: Oropharynx is clear and moist.  Mucous membranes are moist.   Eyes: Conjunctivae and EOM are normal. Pupils are equal, round, and reactive to light. No scleral icterus.  Pupils are normal. Scleras are clear. Eyes track normal.    Neck: Neck supple. No tracheal deviation present.  Cardiovascular: Normal rate, regular rhythm and normal heart  sounds.   Pulmonary/Chest: Effort normal and breath sounds normal. No respiratory distress. She has no wheezes. She has no rales.  Lungs are clear to auscultation bilaterally.  Abdominal: Soft. Bowel sounds are normal. There is no tenderness.  Abdomen is non-tender.  Musculoskeletal: Normal range of motion. She exhibits no edema.  No ankle swelling. Able to move ankles.  Neurological: She is alert and oriented to person, place, and time. No cranial nerve deficit.  Skin: Skin is warm and dry.  Psychiatric: She has a normal mood and affect. Her behavior is normal.  Nursing note and vitals reviewed.   ED Course  Procedures (including critical care time)  DIAGNOSTIC STUDIES: Oxygen Saturation is 94% on RA, normal by my interpretation.    COORDINATION OF CARE: 10:02 PM - Discussed treatment plan with pt at bedside, which includes imaging of back and head. Pt verbalized understanding and agreed to plan.   Labs Review Labs Reviewed - No data to display  Imaging Review Dg Lumbar Spine Complete  02/13/2016  CLINICAL DATA:  82 year old female with fall and  trauma to the back EXAM: LUMBAR SPINE - COMPLETE 4+ VIEW COMPARISON:  Lumbar spine radiograph dated 06/20/2015 FINDINGS: There is advanced osteopenia which limits evaluation for fracture. No definite acute fracture or subluxation identified. There is stable appearing compression deformity of the superior endplate of the L1 vertebrae. There multilevel degenerative changes with multilevel disc desiccation most prominent at L4-L5. There is stable grade 1 L4-5 anterolisthesis. The soft tissues are grossly unremarkable. IMPRESSION: No definite acute fracture or subluxation. Osteopenia with multilevel degenerative changes. Stable appearing L1 superior endplate compression deformity. Electronically Signed   By: Elgie Collard M.D.   On: 02/13/2016 00:16   Ct Head Wo Contrast  02/12/2016  CLINICAL DATA:  Fall backwards landing on the floor EXAM: CT HEAD WITHOUT CONTRAST CT CERVICAL SPINE WITHOUT CONTRAST TECHNIQUE: Multidetector CT imaging of the head and cervical spine was performed following the standard protocol without intravenous contrast. Multiplanar CT image reconstructions of the cervical spine were also generated. COMPARISON:  06/20/2015 FINDINGS: CT HEAD FINDINGS No skull fracture is noted. Paranasal sinuses and mastoid air cells are unremarkable. Mild atherosclerotic calcifications of carotid siphon. Moderate cerebral atrophy again noted. Stable mild periventricular chronic white matter disease. No acute cortical infarction. No mass lesion is noted on this unenhanced scan. Ventricular size is stable from prior exam. CT CERVICAL SPINE FINDINGS Axial images of the cervical spine shows no acute fracture or subluxation. Computer processed images shows no acute fracture or subluxation. Again noted degenerative changes C1-C2 articulation. Again noted disc space flattening with mild posterior spurring at C3-C4 level. Stable disc space flattening with mild posterior spurring at C4-C5 level. There is disc space  flattening with mild anterior and mild posterior spurring at C5-C6 level. Stable minimal anterolisthesis about 1.5 mm C5 on C6 vertebral body. Again noted disc space flattening with mild anterior spurring and mild posterior spurring at C6-C7 level. Stable mild about 2 mm anterolisthesis C6 on C7 vertebral body. No prevertebral soft tissue swelling. Cervical airway is patent. There is no pneumothorax in visualized lung apices. IMPRESSION: 1. No acute intracranial abnormality. Stable atrophy and chronic white matter disease. 2. No cervical spine acute fracture or subluxation. Stable degenerative changes as described above. Electronically Signed   By: Natasha Mead M.D.   On: 02/12/2016 23:16   Ct Cervical Spine Wo Contrast  02/12/2016  CLINICAL DATA:  Fall backwards landing on the floor EXAM: CT HEAD WITHOUT CONTRAST CT  CERVICAL SPINE WITHOUT CONTRAST TECHNIQUE: Multidetector CT imaging of the head and cervical spine was performed following the standard protocol without intravenous contrast. Multiplanar CT image reconstructions of the cervical spine were also generated. COMPARISON:  06/20/2015 FINDINGS: CT HEAD FINDINGS No skull fracture is noted. Paranasal sinuses and mastoid air cells are unremarkable. Mild atherosclerotic calcifications of carotid siphon. Moderate cerebral atrophy again noted. Stable mild periventricular chronic white matter disease. No acute cortical infarction. No mass lesion is noted on this unenhanced scan. Ventricular size is stable from prior exam. CT CERVICAL SPINE FINDINGS Axial images of the cervical spine shows no acute fracture or subluxation. Computer processed images shows no acute fracture or subluxation. Again noted degenerative changes C1-C2 articulation. Again noted disc space flattening with mild posterior spurring at C3-C4 level. Stable disc space flattening with mild posterior spurring at C4-C5 level. There is disc space flattening with mild anterior and mild posterior spurring  at C5-C6 level. Stable minimal anterolisthesis about 1.5 mm C5 on C6 vertebral body. Again noted disc space flattening with mild anterior spurring and mild posterior spurring at C6-C7 level. Stable mild about 2 mm anterolisthesis C6 on C7 vertebral body. No prevertebral soft tissue swelling. Cervical airway is patent. There is no pneumothorax in visualized lung apices. IMPRESSION: 1. No acute intracranial abnormality. Stable atrophy and chronic white matter disease. 2. No cervical spine acute fracture or subluxation. Stable degenerative changes as described above. Electronically Signed   By: Natasha Mead M.D.   On: 02/12/2016 23:16   I have personally reviewed and evaluated these images and lab results as part of my medical decision-making.   EKG Interpretation None      MDM   Final diagnoses:  Fall, initial encounter  Head injury, initial encounter  Lumbar strain, initial encounter   Patient status post fall fell backwards striking her head. Head CT negative. Head CT negative. Patient's lumbar x-rays without any bony injuries. Patient's main complaint was low back pain. Patient without any hip or leg pain. Patient nontoxic no acute distress.  I personally performed the services described in this documentation, which was scribed in my presence. The recorded information has been reviewed and is accurate.       Vanetta Mulders, MD 02/13/16 [phone]

## 2016-02-12 NOTE — ED Notes (Signed)
[facility] is a [facility].  Pt was standing at her dresser when she says she fell backward and landed on the floor on her back.  Pt denies loc. Pt has pain to the back of her head only.

## 2016-02-12 NOTE — ED Notes (Signed)
Pt return from xray.

## 2016-02-12 NOTE — ED Notes (Signed)
To xray and CT via wheelchair.

## 2016-02-12 NOTE — ED Notes (Signed)
Pt reports putting things in a cabinet and fell backwards- she is alert, oriented and neuro intact. Complains of pain to the back of her head as well as low back pain.

## 2016-02-13 NOTE — ED Notes (Signed)
[facility] pillow-

## 2016-02-13 NOTE — Discharge Instructions (Signed)
CT of head neck and x-rays of the back without any acute injuries or bony injuries. Follow-up with your doctor as needed. Return for any new or worse symptoms.

## 2016-02-13 NOTE — ED Notes (Addendum)
Pt repositioned, pulse ox repositioned and O2 sat back t 90-91

## 2016-02-13 NOTE — ED Notes (Signed)
[place] [facility] Hilda [place]Marie, [place] [facility] [phone]. With teach back DC instructions.  They will send someone after the pt

## 2016-02-13 NOTE — ED Notes (Signed)
Resting awaiting disposition 

## 2016-02-16 ENCOUNTER — Encounter (HOSPITAL_COMMUNITY): Payer: Self-pay | Admitting: Emergency Medicine

## 2016-02-16 ENCOUNTER — Emergency Department (HOSPITAL_COMMUNITY): Payer: Medicare Other

## 2016-02-16 ENCOUNTER — Inpatient Hospital Stay (HOSPITAL_COMMUNITY)
Admission: EM | Admit: 2016-02-16 | Discharge: 2016-02-19 | DRG: 689 | Disposition: A | Discharge status: Skilled Nursing Facility | Payer: Medicare Other | Attending: Internal Medicine | Admitting: Internal Medicine

## 2016-02-16 DIAGNOSIS — F0390 Unspecified dementia without behavioral disturbance: Secondary | ICD-10-CM | POA: Diagnosis present

## 2016-02-16 DIAGNOSIS — B9620 Unspecified Escherichia coli [E. coli] as the cause of diseases classified elsewhere: Secondary | ICD-10-CM

## 2016-02-16 DIAGNOSIS — M6281 Muscle weakness (generalized): Secondary | ICD-10-CM | POA: Diagnosis not present

## 2016-02-16 DIAGNOSIS — R402411 Glasgow coma scale score 13-15, in the field [EMT or ambulance]: Secondary | ICD-10-CM | POA: Diagnosis not present

## 2016-02-16 DIAGNOSIS — T391X5A Adverse effect of 4-Aminophenol derivatives, initial encounter: Secondary | ICD-10-CM | POA: Diagnosis present

## 2016-02-16 DIAGNOSIS — Z8711 Personal history of peptic ulcer disease: Secondary | ICD-10-CM

## 2016-02-16 DIAGNOSIS — S32512A Fracture of superior rim of left pubis, initial encounter for closed fracture: Secondary | ICD-10-CM | POA: Diagnosis not present

## 2016-02-16 DIAGNOSIS — S32502K Unspecified fracture of left pubis, subsequent encounter for fracture with nonunion: Secondary | ICD-10-CM | POA: Diagnosis not present

## 2016-02-16 DIAGNOSIS — Z4789 Encounter for other orthopedic aftercare: Secondary | ICD-10-CM | POA: Diagnosis not present

## 2016-02-16 DIAGNOSIS — K254 Chronic or unspecified gastric ulcer with hemorrhage: Secondary | ICD-10-CM | POA: Diagnosis not present

## 2016-02-16 DIAGNOSIS — T380X5A Adverse effect of glucocorticoids and synthetic analogues, initial encounter: Secondary | ICD-10-CM | POA: Diagnosis present

## 2016-02-16 DIAGNOSIS — E871 Hypo-osmolality and hyponatremia: Secondary | ICD-10-CM | POA: Diagnosis present

## 2016-02-16 DIAGNOSIS — M1990 Unspecified osteoarthritis, unspecified site: Secondary | ICD-10-CM | POA: Diagnosis not present

## 2016-02-16 DIAGNOSIS — E039 Hypothyroidism, unspecified: Secondary | ICD-10-CM | POA: Diagnosis present

## 2016-02-16 DIAGNOSIS — K219 Gastro-esophageal reflux disease without esophagitis: Secondary | ICD-10-CM | POA: Diagnosis not present

## 2016-02-16 DIAGNOSIS — R410 Disorientation, unspecified: Secondary | ICD-10-CM | POA: Diagnosis not present

## 2016-02-16 DIAGNOSIS — S329XXA Fracture of unspecified parts of lumbosacral spine and pelvis, initial encounter for closed fracture: Secondary | ICD-10-CM

## 2016-02-16 DIAGNOSIS — Z87440 Personal history of urinary (tract) infections: Secondary | ICD-10-CM | POA: Diagnosis not present

## 2016-02-16 DIAGNOSIS — R4182 Altered mental status, unspecified: Secondary | ICD-10-CM

## 2016-02-16 DIAGNOSIS — J9601 Acute respiratory failure with hypoxia: Secondary | ICD-10-CM

## 2016-02-16 DIAGNOSIS — Z66 Do not resuscitate: Secondary | ICD-10-CM | POA: Diagnosis present

## 2016-02-16 DIAGNOSIS — Z9181 History of falling: Secondary | ICD-10-CM | POA: Diagnosis not present

## 2016-02-16 DIAGNOSIS — J9691 Respiratory failure, unspecified with hypoxia: Secondary | ICD-10-CM | POA: Diagnosis not present

## 2016-02-16 DIAGNOSIS — I129 Hypertensive chronic kidney disease with stage 1 through stage 4 chronic kidney disease, or unspecified chronic kidney disease: Secondary | ICD-10-CM | POA: Diagnosis present

## 2016-02-16 DIAGNOSIS — K279 Peptic ulcer, site unspecified, unspecified as acute or chronic, without hemorrhage or perforation: Secondary | ICD-10-CM | POA: Diagnosis present

## 2016-02-16 DIAGNOSIS — I10 Essential (primary) hypertension: Secondary | ICD-10-CM | POA: Diagnosis present

## 2016-02-16 DIAGNOSIS — S32592A Other specified fracture of left pubis, initial encounter for closed fracture: Secondary | ICD-10-CM

## 2016-02-16 DIAGNOSIS — R531 Weakness: Secondary | ICD-10-CM | POA: Diagnosis not present

## 2016-02-16 DIAGNOSIS — S32502A Unspecified fracture of left pubis, initial encounter for closed fracture: Secondary | ICD-10-CM | POA: Diagnosis present

## 2016-02-16 DIAGNOSIS — M810 Age-related osteoporosis without current pathological fracture: Secondary | ICD-10-CM | POA: Diagnosis not present

## 2016-02-16 DIAGNOSIS — E861 Hypovolemia: Secondary | ICD-10-CM | POA: Diagnosis present

## 2016-02-16 DIAGNOSIS — F4489 Other dissociative and conversion disorders: Secondary | ICD-10-CM | POA: Diagnosis not present

## 2016-02-16 DIAGNOSIS — R293 Abnormal posture: Secondary | ICD-10-CM | POA: Diagnosis not present

## 2016-02-16 DIAGNOSIS — R262 Difficulty in walking, not elsewhere classified: Secondary | ICD-10-CM | POA: Diagnosis not present

## 2016-02-16 DIAGNOSIS — G4489 Other headache syndrome: Secondary | ICD-10-CM | POA: Diagnosis not present

## 2016-02-16 DIAGNOSIS — W010XXA Fall on same level from slipping, tripping and stumbling without subsequent striking against object, initial encounter: Secondary | ICD-10-CM | POA: Diagnosis present

## 2016-02-16 DIAGNOSIS — N183 Chronic kidney disease, stage 3 unspecified: Secondary | ICD-10-CM | POA: Diagnosis present

## 2016-02-16 DIAGNOSIS — S32599A Other specified fracture of unspecified pubis, initial encounter for closed fracture: Secondary | ICD-10-CM | POA: Diagnosis present

## 2016-02-16 DIAGNOSIS — N390 Urinary tract infection, site not specified: Secondary | ICD-10-CM | POA: Diagnosis not present

## 2016-02-16 DIAGNOSIS — S3282XD Multiple fractures of pelvis without disruption of pelvic ring, subsequent encounter for fracture with routine healing: Secondary | ICD-10-CM | POA: Diagnosis not present

## 2016-02-16 DIAGNOSIS — G9340 Encephalopathy, unspecified: Secondary | ICD-10-CM | POA: Diagnosis not present

## 2016-02-16 DIAGNOSIS — R443 Hallucinations, unspecified: Secondary | ICD-10-CM | POA: Diagnosis not present

## 2016-02-16 DIAGNOSIS — S32502D Unspecified fracture of left pubis, subsequent encounter for fracture with routine healing: Secondary | ICD-10-CM | POA: Diagnosis not present

## 2016-02-16 HISTORY — DX: Hypothyroidism, unspecified: E03.9

## 2016-02-16 HISTORY — DX: Personal history of (healed) traumatic fracture: Z87.81

## 2016-02-16 HISTORY — DX: Personal history of other (healed) physical injury and trauma: Z87.828

## 2016-02-16 HISTORY — DX: Urinary tract infection, site not specified: B96.20

## 2016-02-16 HISTORY — DX: Chronic kidney disease, stage 3 (moderate): N18.3

## 2016-02-16 HISTORY — DX: Pneumonia, unspecified organism: J18.9

## 2016-02-16 HISTORY — DX: Urinary tract infection, site not specified: N39.0

## 2016-02-16 LAB — CBC WITH DIFFERENTIAL/PLATELET
Basophils Absolute: 0 10*3/uL (ref 0.0–0.1)
Basophils Relative: 0 %
Eosinophils Absolute: 0 10*3/uL (ref 0.0–0.7)
Eosinophils Relative: 0 %
HEMATOCRIT: 40.4 % (ref 36.0–46.0)
HEMOGLOBIN: 13.7 g/dL (ref 12.0–15.0)
LYMPHS ABS: 2 10*3/uL (ref 0.7–4.0)
Lymphocytes Relative: 10 %
MCH: 31.9 pg (ref 26.0–34.0)
MCHC: 33.9 g/dL (ref 30.0–36.0)
MCV: 94 fL (ref 78.0–100.0)
MONOS PCT: 9 %
Monocytes Absolute: 1.8 10*3/uL — ABNORMAL HIGH (ref 0.1–1.0)
NEUTROS ABS: 16.2 10*3/uL — AB (ref 1.7–7.7)
NEUTROS PCT: 81 %
Platelets: 278 10*3/uL (ref 150–400)
RBC: 4.3 MIL/uL (ref 3.87–5.11)
RDW: 12.6 % (ref 11.5–15.5)
WBC: 20 10*3/uL — AB (ref 4.0–10.5)

## 2016-02-16 LAB — URINALYSIS, ROUTINE W REFLEX MICROSCOPIC
Bilirubin Urine: NEGATIVE
Glucose, UA: NEGATIVE mg/dL
Ketones, ur: NEGATIVE mg/dL
NITRITE: NEGATIVE
PH: 6 (ref 5.0–8.0)
Protein, ur: 30 mg/dL — AB
Specific Gravity, Urine: 1.025 (ref 1.005–1.030)

## 2016-02-16 LAB — COMPREHENSIVE METABOLIC PANEL
ALBUMIN: 3.9 g/dL (ref 3.5–5.0)
ALK PHOS: 66 U/L (ref 38–126)
ALT: 16 U/L (ref 14–54)
ANION GAP: 8 (ref 5–15)
AST: 19 U/L (ref 15–41)
BILIRUBIN TOTAL: 0.8 mg/dL (ref 0.3–1.2)
BUN: 44 mg/dL — ABNORMAL HIGH (ref 6–20)
CALCIUM: 8.6 mg/dL — AB (ref 8.9–10.3)
CO2: 27 mmol/L (ref 22–32)
CREATININE: 1.15 mg/dL — AB (ref 0.44–1.00)
Chloride: 97 mmol/L — ABNORMAL LOW (ref 101–111)
GFR calc non Af Amer: 39 mL/min — ABNORMAL LOW (ref >60)
GFR, EST AFRICAN AMERICAN: 46 mL/min — AB (ref >60)
GLUCOSE: 107 mg/dL — AB (ref 65–99)
Potassium: 3.9 mmol/L (ref 3.5–5.1)
Sodium: 132 mmol/L — ABNORMAL LOW (ref 135–145)
TOTAL PROTEIN: 6.8 g/dL (ref 6.5–8.1)

## 2016-02-16 LAB — URINE MICROSCOPIC-ADD ON: SQUAMOUS EPITHELIAL / LPF: NONE SEEN

## 2016-02-16 LAB — MRSA PCR SCREENING: MRSA by PCR: NEGATIVE

## 2016-02-16 MED ORDER — ALBUTEROL SULFATE (2.5 MG/3ML) 0.083% IN NEBU: 2.5000 mg | INHALATION_SOLUTION | RESPIRATORY_TRACT | Status: DC | PRN

## 2016-02-16 MED ORDER — ADULT MULTIVITAMIN W/MINERALS CH
1.0000 | ORAL_TABLET | Freq: Every day | ORAL | Status: DC
Start: 2016-02-17 — End: 2016-02-19
  Administered 2016-02-17 – 2016-02-19 (×3): 1 via ORAL
  Filled 2016-02-16 (×3): qty 1

## 2016-02-16 MED ORDER — SODIUM CHLORIDE 0.9 % IV BOLUS (SEPSIS)
1000.0000 mL | Freq: Once | INTRAVENOUS | Status: AC
  Administered 2016-02-16: 1000 mL via INTRAVENOUS

## 2016-02-16 MED ORDER — PSYLLIUM 95 % PO PACK
1.0000 | PACK | Freq: Every day | ORAL | Status: DC
  Administered 2016-02-17 – 2016-02-19 (×3): 1 via ORAL
  Filled 2016-02-16 (×7): qty 1

## 2016-02-16 MED ORDER — LUTEIN 6 MG PO CAPS: 1.0000 | ORAL_CAPSULE | Freq: Every day | ORAL | Status: DC

## 2016-02-16 MED ORDER — ACETAMINOPHEN 325 MG PO TABS
650.0000 mg | ORAL_TABLET | Freq: Four times a day (QID) | ORAL | Status: DC | PRN
  Administered 2016-02-16 – 2016-02-19 (×3): 650 mg via ORAL
  Filled 2016-02-16 (×3): qty 2

## 2016-02-16 MED ORDER — CIPROFLOXACIN IN D5W 400 MG/200ML IV SOLN
400.0000 mg | INTRAVENOUS | Status: DC
  Administered 2016-02-17 – 2016-02-19 (×3): 400 mg via INTRAVENOUS
  Filled 2016-02-16 (×3): qty 200

## 2016-02-16 MED ORDER — ONDANSETRON HCL 4 MG/2ML IJ SOLN: 4.0000 mg | Freq: Four times a day (QID) | INTRAMUSCULAR | Status: DC | PRN

## 2016-02-16 MED ORDER — POTASSIUM CHLORIDE IN NACL 20-0.9 MEQ/L-% IV SOLN
INTRAVENOUS | Status: DC
  Administered 2016-02-16 – 2016-02-19 (×3): via INTRAVENOUS

## 2016-02-16 MED ORDER — LOPERAMIDE HCL 2 MG PO CAPS
2.0000 mg | ORAL_CAPSULE | Freq: Two times a day (BID) | ORAL | Status: DC | PRN
Start: 2016-02-16 — End: 2016-02-19

## 2016-02-16 MED ORDER — MECLIZINE HCL 12.5 MG PO TABS: 25.0000 mg | ORAL_TABLET | Freq: Three times a day (TID) | ORAL | Status: DC | PRN

## 2016-02-16 MED ORDER — HYDROCODONE-ACETAMINOPHEN 5-325 MG PO TABS
1.0000 | ORAL_TABLET | ORAL | Status: DC | PRN
  Administered 2016-02-17 – 2016-02-18 (×6): 1 via ORAL
  Filled 2016-02-16 (×7): qty 1

## 2016-02-16 MED ORDER — LEVOTHYROXINE SODIUM 50 MCG PO TABS
250.0000 ug | ORAL_TABLET | ORAL | Status: DC
  Administered 2016-02-17 – 2016-02-18 (×2): 250 ug via ORAL
  Filled 2016-02-16 (×3): qty 1

## 2016-02-16 MED ORDER — ENOXAPARIN SODIUM 30 MG/0.3ML ~~LOC~~ SOLN
30.0000 mg | SUBCUTANEOUS | Status: DC
  Administered 2016-02-16 – 2016-02-18 (×3): 30 mg via SUBCUTANEOUS
  Filled 2016-02-16 (×3): qty 0.3

## 2016-02-16 MED ORDER — CALCIUM CARBONATE 1250 (500 CA) MG PO TABS
500.0000 mg | ORAL_TABLET | Freq: Every day | ORAL | Status: DC
  Administered 2016-02-16 – 2016-02-19 (×4): 500 mg via ORAL
  Filled 2016-02-16 (×4): qty 1

## 2016-02-16 MED ORDER — ESCITALOPRAM OXALATE 10 MG PO TABS
10.0000 mg | ORAL_TABLET | Freq: Every day | ORAL | Status: DC
  Administered 2016-02-16 – 2016-02-18 (×3): 10 mg via ORAL
  Filled 2016-02-16 (×4): qty 1

## 2016-02-16 MED ORDER — POLYVINYL ALCOHOL 1.4 % OP SOLN
Freq: Four times a day (QID) | OPHTHALMIC | Status: DC
  Administered 2016-02-16 – 2016-02-19 (×8): via OPHTHALMIC
  Filled 2016-02-16 (×2): qty 15

## 2016-02-16 MED ORDER — DONEPEZIL HCL 5 MG PO TABS
10.0000 mg | ORAL_TABLET | Freq: Every day | ORAL | Status: DC
  Administered 2016-02-16 – 2016-02-18 (×3): 10 mg via ORAL
  Filled 2016-02-16 (×4): qty 2

## 2016-02-16 MED ORDER — CIPROFLOXACIN IN D5W 400 MG/200ML IV SOLN
400.0000 mg | Freq: Once | INTRAVENOUS | Status: AC
  Administered 2016-02-16: 400 mg via INTRAVENOUS
  Filled 2016-02-16: qty 200

## 2016-02-16 MED ORDER — MEMANTINE HCL 10 MG PO TABS
10.0000 mg | ORAL_TABLET | Freq: Two times a day (BID) | ORAL | Status: DC
  Administered 2016-02-16 – 2016-02-19 (×6): 10 mg via ORAL
  Filled 2016-02-16 (×6): qty 1

## 2016-02-16 MED ORDER — LEVOTHYROXINE SODIUM 125 MCG PO TABS: 125.0000 ug | ORAL_TABLET | ORAL | Status: DC

## 2016-02-16 MED ORDER — LORATADINE 10 MG PO TABS
10.0000 mg | ORAL_TABLET | Freq: Every day | ORAL | Status: DC
  Administered 2016-02-16 – 2016-02-19 (×4): 10 mg via ORAL
  Filled 2016-02-16 (×4): qty 1

## 2016-02-16 MED ORDER — PANTOPRAZOLE SODIUM 40 MG PO TBEC
40.0000 mg | DELAYED_RELEASE_TABLET | Freq: Every day | ORAL | Status: DC
  Administered 2016-02-16 – 2016-02-19 (×4): 40 mg via ORAL
  Filled 2016-02-16 (×4): qty 1

## 2016-02-16 MED ORDER — HYDROCORTISONE 0.5 % EX CREA
1.0000 "application " | TOPICAL_CREAM | Freq: Two times a day (BID) | CUTANEOUS | Status: DC | PRN
  Filled 2016-02-16: qty 28.35

## 2016-02-16 MED ORDER — ENSURE ENLIVE PO LIQD: 237.0000 mL | ORAL | Status: DC | PRN

## 2016-02-16 MED ORDER — LEVOTHYROXINE SODIUM 100 MCG PO TABS
125.0000 ug | ORAL_TABLET | ORAL | Status: DC
Start: 2016-02-21 — End: 2016-02-19

## 2016-02-16 MED ORDER — ONDANSETRON HCL 4 MG PO TABS: 4.0000 mg | ORAL_TABLET | Freq: Four times a day (QID) | ORAL | Status: DC | PRN

## 2016-02-16 MED ORDER — FERROUS SULFATE 325 (65 FE) MG PO TABS
325.0000 mg | ORAL_TABLET | Freq: Every day | ORAL | Status: DC
  Administered 2016-02-17 – 2016-02-19 (×3): 325 mg via ORAL
  Filled 2016-02-16 (×3): qty 1

## 2016-02-16 MED ORDER — LOSARTAN POTASSIUM 50 MG PO TABS
50.0000 mg | ORAL_TABLET | Freq: Every day | ORAL | Status: DC
  Administered 2016-02-16 – 2016-02-19 (×4): 50 mg via ORAL
  Filled 2016-02-16 (×4): qty 1

## 2016-02-16 NOTE — ED Notes (Signed)
Son and grandson at the bedside, updated on bed assignment,  pt sleeping, family has not notice any signs of distress, Son ask if pt had pain medication to make her sleep. Pt has only been given fluids and antibiotics.

## 2016-02-16 NOTE — ED Provider Notes (Signed)
CSN: [id number]     Arrival date & time 02/16/16  1011 History  By signing my name below, I, Tanda RockersMargaux Venter, attest that this documentation has been prepared under the direction and in the presence of Marily MemosJason Gerald Honea, MD. Electronically Signed: Tanda RockersMargaux Venter, ED Scribe. 02/16/2016. 10:48 AM.    Chief Complaint  Patient presents with  . Altered Mental Status   LEVEL 5 CAVEAT for altered mental status   The history is provided by the patient and a relative. No language interpreter was used.    HPI Comments: [name] is a 82 y.o. female brought in by ambulance from [place], with PMHx dementia and HTN who presents to the Emergency Department for altered mental status that began yesterday. Per triage report, staff told EMS that pt was having hallucinations and difficulty with coordination. She was started on Prednisone yesterday for contact dermatitis. When asked where her rash is she says "everywhere." Pt states she has not urinated or had a bowel movement today. Pt is alert. She is aware of the date and year. No hx UTIs. Per daughter, pt had a rash. Seen at PCP 6 days ago for the rash and started on Prednisone. Daughter mentions that pt began hallucinating yesterday. She was unable to get a fork to her mouth and was drooling/unresponsive at times. Pt was taken off of Prednisone. Daughter states that pt fell a couple of days ago and was seen here in the ED for lower back pain. Pt states that she fell backwards and landed onto her buttocks and head and has continued to have lower back pain since then. Pt has not been able to get up by herself since the fall. Per daughter, pt had had a UTI in the past.   Past Medical History  Diagnosis Date  . Thyroid disease   . Hypertension   . Dementia   . Anemia   . Gastric ulcer     gi bleed secondary  . Renal disorder   . Vertigo    Past Surgical History  Procedure Laterality Date  . Thyroid surgery    . Mastectomy    . Carpal tunnel release    .  Abdominal hysterectomy    . Femur im nail  05/13/2012    Procedure: INTRAMEDULLARY (IM) NAIL FEMORAL;  Surgeon: Senaida LangeKevin M Supple, MD;  Location: [facility] OR;  Service: Orthopedics;  Laterality: Right;  . Fracture surgery     Family History  Problem Relation Age of Onset  . Thyroid disease Neg Hx    Social History  Substance Use Topics  . Smoking status: Never Smoker   . Smokeless tobacco: None  . Alcohol Use: No   OB History    No data available     Review of Systems  Unable to perform ROS: Mental status change    Allergies  Amoxicillin-pot clavulanate; Cephalexin; and Nsaids  Home Medications   Prior to Admission medications   Medication Sig Start Date End Date Taking? Authorizing Provider  acetaminophen (MAPAP) 325 MG tablet Take 650 mg by mouth every 4 (four) hours as needed for mild pain or moderate pain.    Historical Provider, MD  acetaminophen (MAPAP) 500 MG tablet Take 1,000 mg by mouth at bedtime.    Historical Provider, MD  Biotin 300 MCG TABS Take 300 mcg by mouth daily.     Historical Provider, MD  diphenhydrAMINE (BENADRYL) 25 MG tablet Take 25 mg by mouth every 8 (eight) hours as needed for itching.  Historical Provider, MD  donepezil (ARICEPT) 10 MG tablet Take 10 mg by mouth daily.     Historical Provider, MD  escitalopram (LEXAPRO) 10 MG tablet Take 10 mg by mouth daily.    Historical Provider, MD  feeding supplement (ENSURE COMPLETE) LIQD Take 237 mLs by mouth as needed (for nutritional supplementation). 05/16/12   Leroy SeaPrashant K Singh, MD  ferrous sulfate 325 (65 FE) MG tablet Take 1 tablet (325 mg total) by mouth 3 (three) times daily with meals. Patient taking differently: Take 325 mg by mouth daily.  05/24/12   Erick BlinksJehanzeb Memon, MD  hydrocortisone cream 0.5 % Apply 1 application topically 2 (two) times daily.    Historical Provider, MD  levothyroxine (SYNTHROID, LEVOTHROID) 125 MCG tablet Take 125-250 mcg by mouth See admin instructions. Take 2 tablets (250mcg total) on  all days except on Sundays. Take 1 tablet (125mcg total) on Sundays    Historical Provider, MD  loperamide (IMODIUM A-D) 2 MG tablet Take 2 mg by mouth 2 (two) times daily.    Historical Provider, MD  loratadine (CLARITIN) 10 MG tablet Take 10 mg by mouth daily.    Historical Provider, MD  losartan (COZAAR) 50 MG tablet Take 50 mg by mouth daily.    Historical Provider, MD  Lutein 6 MG CAPS Take 1 capsule by mouth daily.    Historical Provider, MD  meclizine (ANTIVERT) 25 MG tablet Take 25 mg by mouth 3 (three) times daily as needed for dizziness.    Historical Provider, MD  memantine (NAMENDA) 10 MG tablet Take 10 mg by mouth 2 (two) times daily.    Historical Provider, MD  Menthol, Topical Analgesic, 10 % LIQD Apply 1 application topically 2 (two) times daily as needed (for pain to bilateral elbows).    Historical Provider, MD  methylPREDNISolone (MEDROL) 4 MG tablet Take 4 mg by mouth See admin instructions. For contact dermatitis    Historical Provider, MD  Multiple Vitamin (DAILY VITE) TABS Take 1 tablet by mouth daily.    Historical Provider, MD  omeprazole (PRILOSEC) 20 MG capsule Take 20 mg by mouth daily. 02/02/15   Historical Provider, MD  Ethelda Chickyster Shell (OYSTER CALCIUM) 500 MG TABS tablet Take 500 mg of elemental calcium by mouth daily.    Historical Provider, MD  Polyethyl Glycol-Propyl Glycol (SYSTANE OP) Apply 1 drop to eye 4 (four) times daily.    Historical Provider, MD  psyllium (METAMUCIL SMOOTH TEXTURE) 28 % packet Take 1 packet by mouth daily. 3g given daily. *May give additional up to three times daily as needed for constipation    Historical Provider, MD   BP 149/65 mmHg  Pulse 67  Temp(Src) 97.8 F (36.6 C) (Oral)  Resp 17  Ht 5' (1.524 m)  Wt 102 lb (46.267 kg)  BMI 19.92 kg/m2  SpO2 94%   Physical Exam  Constitutional: She is oriented to person, place, and time. She appears well-developed and well-nourished. No distress.  HENT:  Head: Normocephalic and atraumatic.   Eyes: Conjunctivae and EOM are normal.  Neck: Neck supple. No tracheal deviation present.  Cardiovascular: Normal rate and regular rhythm.   Murmur heard. 2/6 systolic murmur  Pulmonary/Chest: Effort normal. No respiratory distress.  Abdominal: There is tenderness.  Lower abdominal pain  Musculoskeletal: Normal range of motion. She exhibits tenderness.  Tenderness over posterior illiac  Neurological: She is alert and oriented to person, place, and time.  Skin: Skin is warm and dry.  Psychiatric: She has a normal mood and affect.  Her behavior is normal.  Nursing note and vitals reviewed.   ED Course  Procedures (including critical care time)  DIAGNOSTIC STUDIES: Oxygen Saturation is 94% on RA, adequate by my interpretation.    COORDINATION OF CARE: 10:38 AM-Discussed treatment plan which includes CT Head, CT Pelvis, and UA with pt/daughter at bedside and pt/daughter agreed to plan.   Labs Review Labs Reviewed  URINALYSIS, ROUTINE W REFLEX MICROSCOPIC (NOT AT [facility]) - Abnormal; Notable for the following:    APPearance HAZY (*)    Hgb urine dipstick TRACE (*)    Protein, ur 30 (*)    Leukocytes, UA SMALL (*)    All other components within normal limits  CBC WITH DIFFERENTIAL/PLATELET - Abnormal; Notable for the following:    WBC 20.0 (*)    Neutro Abs 16.2 (*)    Monocytes Absolute 1.8 (*)    All other components within normal limits  COMPREHENSIVE METABOLIC PANEL - Abnormal; Notable for the following:    Sodium 132 (*)    Chloride 97 (*)    Glucose, Bld 107 (*)    BUN 44 (*)    Creatinine, Ser 1.15 (*)    Calcium 8.6 (*)    GFR calc non Af Amer 39 (*)    GFR calc Af Amer 46 (*)    All other components within normal limits  URINE MICROSCOPIC-ADD ON - Abnormal; Notable for the following:    Bacteria, UA MANY (*)    All other components within normal limits  BASIC METABOLIC PANEL - Abnormal; Notable for the following:    Sodium 134 (*)    BUN 31 (*)    Creatinine,  Ser 1.04 (*)    Calcium 8.2 (*)    GFR calc non Af Amer 45 (*)    GFR calc Af Amer 52 (*)    All other components within normal limits  CBC - Abnormal; Notable for the following:    WBC 14.7 (*)    All other components within normal limits  MRSA PCR SCREENING  URINE CULTURE  TSH    Imaging Review Ct Head Wo Contrast  02/16/2016  CLINICAL DATA:  Altered mental status. Fall 2 days ago. Initial encounter. EXAM: CT HEAD WITHOUT CONTRAST TECHNIQUE: Contiguous axial images were obtained from the base of the skull through the vertex without intravenous contrast. COMPARISON:  02/12/2016 FINDINGS: Brain: No evidence of acute infarction, hemorrhage, hydrocephalus, or mass lesion/mass effect. Moderate generalized atrophy with ventriculomegaly. Age congruent, mild periventricular chronic microvascular ischemic change. Vascular: No hyperdense vessel or unexpected calcification. Skull: Negative for fracture or focal lesion. Sinuses/Orbits: Bilateral cataract resection. No acute or posttraumatic finding. Other: None. IMPRESSION: 1. No acute finding or change from prior. 2. Moderate atrophy. Electronically Signed   By: Marnee Spring M.D.   On: 02/16/2016 12:23   Ct Pelvis Wo Contrast  02/16/2016  CLINICAL DATA:  Fall 2 days ago, landing on buttocks. Low back pain. EXAM: CT PELVIS WITHOUT CONTRAST TECHNIQUE: Multidetector CT imaging of the pelvis was performed following the standard protocol without intravenous contrast. COMPARISON:  08/11/2014 FINDINGS: Old right femoral neck fracture with changes of internal fixation. Fractures through the left superior and inferior pubic ramus. No acute femoral neck fracture. No subluxation or dislocation. Pelvic soft tissues are unremarkable. Large stool burden in the rectum. IMPRESSION: Fractures through the left superior and inferior pubic rami. Electronically Signed   By: Charlett Nose M.D.   On: 02/16/2016 12:25   I have personally reviewed and evaluated  these images and  lab results as part of my medical decision-making.   EKG Interpretation None      MDM   Final diagnoses:  Altered mental status, unspecified altered mental status type  Fracture of multiple pubic rami, left, closed, initial encounter (HCC)  Urinary tract infection without hematuria, site unspecified  Acute respiratory failure with hypoxia (HCC)    82 yo F here with AMS likely 2/2 UTI. Also with recent fall and Assistant pelvis pain when walking so CT scan of the pelvis to evaluate for fractures and found to have multiple pubic rami fractures. Patient will be admitted for her delirium associated with her infection and then also to get physical therapy for her pelvic fractures. No indication for surgery at this time.  Apparently while patient was waiting for her bed another patient with mental health instability ran into the room and jumped on the bed with the patient. On examination patient does not seem to have new injuries. I doubt this affected her pelvis fracture at this time. However if she has persistent pain or pain elsewhere when her urinary tract infection improves, she may need repeat imaging.  Marily Memos, MD 02/17/16 1025

## 2016-02-16 NOTE — ED Notes (Signed)
[facility] [place] for altered mental status since yesterday. Staff reports hallucinations, that patient is having coordination problems (missing mouth with fork). Patient is alert. Oriented at present. Noted to mumble to self at times.

## 2016-02-16 NOTE — ED Notes (Signed)
MD at the bedside to assess pt and speak with Son.

## 2016-02-16 NOTE — ED Notes (Signed)
Pt laying in bed on monitor, son at the bedside. Pt from bed 17, breaks and runs into room and jumps over the bedrail on top of pt. Security, tech and RN immediately in the room to get bed 17 pt out of the room. Son remains with mother and primary RN in the room to assess pt. MD advised

## 2016-02-16 NOTE — ED Notes (Signed)
Son calling family, very upset, concerned for his mother. Pt wanting Mother to get to inpatient room. Charge calling the floor to get staus on bed assignment. Son states he is wanting repeat xray, but not wanting to do while in ED. Just wanting to get pt up to 300 asap.

## 2016-02-16 NOTE — ED Notes (Signed)
Patient's pupils are constricted at 1 mm, sluggish reactions. Patient states she is tired. Patient started on prednisone yesterday for contact dermatitis yesterday.

## 2016-02-16 NOTE — H&P (Signed)
History and Physical    Holly Sladenn P Grismore XBM:[id number] DOB: 07-16-1920 DOA: 02/16/2016  PCP: Colette RibasGOLDING, JOHN CABOT, MD  Patient coming from: ALF  Chief Complaint: Confusion and left leg/pelvic pain.  HPI: [name] is a 82 y.o. female with medical history significant for PUD, hypertension, HCAP in October 2016, hypothyroidism, CKD, and dementia, who presents with a report of confusion. The history is being provided by the patient's son and daughter-in-law. Accordingly, the patient lost her balance and fell 4 days ago. She presented to the ED at that time and the CT scan of the head and cervical spine were nonacute and lumbar x-ray was nonacute. She was treated with Tylenol. She developed a rash a few days ago which was all over. Prednisone was prescribed by her PCP 6 days ago. She had been taking it. However, the patient started hallucinating and became lethargic. Prednisone was discontinued. She had a couple more falls at home (ALF) which resulted in a complaint of lower back pain and left-sided pelvic pain. She denies loss of consciousness. She has had some visual changes, but she denies dizziness or blindness. No recent vertigo symptoms. She denies fever, chills, headache, chest pain, shortness of breath, diarrhea, or pain with urination.  ED Course: In the ED, she was afebrile and hemodynamically stable. CT of her head revealed no acute findings. CT of her pelvis revealed an old right femoral neck fracture with internal fixation and fractures through the left superior and inferior pubic rami. Lab data are significant for sodium of 132, BUN of 44, creatinine of 1.15, and WBC of 20. Her urinalysis revealed many bacteria and too numerous to count WBCs. She is being admitted for further evaluation and management.  Review of Systems: As per HPI; in addition, she has decrease in memory from dementia; chronic joint pain; otherwise 10 point review of systems negative.    Past Medical History  Diagnosis  Date  . Hypothyroidism   . Hypertension   . Dementia   . Anemia   . Gastric ulcer     gi bleed secondary  . Vertigo   . History of fracture of right hip 05/21/2012    ORIF 05/13/12   . History of subdural hematoma (post traumatic) 05/21/2012    Dx on MRi in September 2013   . HCAP (healthcare-associated pneumonia) 06/06/2015  . CKD (chronic kidney disease) stage 3, GFR 30-59 ml/min 06/07/2015    Past Surgical History  Procedure Laterality Date  . Thyroid surgery    . Mastectomy    . Carpal tunnel release    . Abdominal hysterectomy    . Femur im nail  05/13/2012    Procedure: INTRAMEDULLARY (IM) NAIL FEMORAL;  Surgeon: Senaida LangeKevin M Supple, MD;  Location: [facility] OR;  Service: Orthopedics;  Laterality: Right;  . Fracture surgery      Social history: Patient is a resident at [facility]. She ambulates with a walker. She reports that she has never smoked. She does not have any smokeless tobacco history on file. She reports that she does not drink alcohol or use illicit drugs. Son reports full code.  Allergies  Allergen Reactions  . Amoxicillin-Pot Clavulanate     REACTION: UNKNOWN REACTION  . Cephalexin     REACTION: UNKNOWN REACTION  . Nsaids     UNKNOWN REACTION    Family History  Problem Relation Age of Onset  . Thyroid disease Neg Hx      Prior to Admission medications   Medication Sig Start  Date End Date Taking? Authorizing Provider  acetaminophen (MAPAP) 325 MG tablet Take 650 mg by mouth every 4 (four) hours as needed for mild pain or moderate pain.   Yes Historical Provider, MD  acetaminophen (MAPAP) 500 MG tablet Take 1,000 mg by mouth at bedtime.   Yes Historical Provider, MD  Biotin 300 MCG TABS Take 300 mcg by mouth daily.    Yes Historical Provider, MD  diphenhydrAMINE (BENADRYL) 25 MG tablet Take 25 mg by mouth every 8 (eight) hours as needed for itching.   Yes Historical Provider, MD  donepezil (ARICEPT) 10 MG tablet Take 10 mg by mouth daily.    Yes Historical  Provider, MD  escitalopram (LEXAPRO) 10 MG tablet Take 10 mg by mouth daily.   Yes Historical Provider, MD  ferrous sulfate 325 (65 FE) MG tablet Take 1 tablet (325 mg total) by mouth 3 (three) times daily with meals. Patient taking differently: Take 325 mg by mouth daily.  05/24/12  Yes Erick Blinks, MD  hydrocortisone cream 0.5 % Apply 1 application topically 2 (two) times daily.   Yes Historical Provider, MD  levothyroxine (SYNTHROID, LEVOTHROID) 125 MCG tablet Take 125-250 mcg by mouth See admin instructions. Take 2 tablets ( total) on all days except on Sundays. Take 1 tablet ( total) on Sundays   Yes Historical Provider, MD  loperamide (IMODIUM A-D) 2 MG tablet Take 2 mg by mouth 2 (two) times daily.   Yes Historical Provider, MD  loratadine (CLARITIN) 10 MG tablet Take 10 mg by mouth daily.   Yes Historical Provider, MD  losartan (COZAAR) 50 MG tablet Take 50 mg by mouth daily.   Yes Historical Provider, MD  Lutein 6 MG CAPS Take 1 capsule by mouth daily.   Yes Historical Provider, MD  meclizine (ANTIVERT) 25 MG tablet Take 25 mg by mouth 3 (three) times daily as needed for dizziness.   Yes Historical Provider, MD  memantine (NAMENDA) 10 MG tablet Take 10 mg by mouth 2 (two) times daily.   Yes Historical Provider, MD  Menthol, Topical Analgesic, 10 % LIQD Apply 1 application topically 2 (two) times daily as needed (for pain to bilateral elbows).   Yes Historical Provider, MD  methylPREDNISolone (MEDROL) 4 MG tablet Take 4 mg by mouth once. For contact dermatitis   Yes Historical Provider, MD  Multiple Vitamin (DAILY VITE) TABS Take 1 tablet by mouth daily.   Yes Historical Provider, MD  omeprazole (PRILOSEC) 20 MG capsule Take 20 mg by mouth daily. 02/02/15  Yes Historical Provider, MD  Ethelda Chick (OYSTER CALCIUM) 500 MG TABS tablet Take 500 mg of elemental calcium by mouth daily.   Yes Historical Provider, MD  Polyethyl Glycol-Propyl Glycol (SYSTANE OP) Apply 1 drop to eye 4  (four) times daily.   Yes Historical Provider, MD  psyllium (METAMUCIL SMOOTH TEXTURE) 28 % packet Take 1 packet by mouth daily. 3g given daily. *May give additional up to three times daily as needed for constipation   Yes Historical Provider, MD  feeding supplement (ENSURE COMPLETE) LIQD Take 237 mLs by mouth as needed (for nutritional supplementation). 05/16/12   Leroy Sea, MD    Physical Exam: Filed Vitals:   02/16/16 1330 02/16/16 1400 02/16/16 1430 02/16/16 1545  BP: 122/63 125/62 150/75 139/55  Pulse: 63 62 63 63  Temp:    97.5 F (36.4 C)  TempSrc:    Axillary  Resp:      Height:    5' (1.524 m)  Weight:    48.3 kg (106 lb 7.7 oz)  SpO2: 95% 94% 92% 99%      Constitutional:Pleasantly demented elderly 82 year old woman in no acute distress. Filed Vitals:   02/16/16 1330 02/16/16 1400 02/16/16 1430 02/16/16 1545  BP: 122/63 125/62 150/75 139/55  Pulse: 63 62 63 63  Temp:    97.5 F (36.4 C)  TempSrc:    Axillary  Resp:      Height:    5' (1.524 m)  Weight:    48.3 kg (106 lb 7.7 oz)  SpO2: 95% 94% 92% 99%   Eyes: PERRL, lids and conjunctivae normal ENMT: Mucous membranes are mildly dry. Posterior pharynx clear of any exudate or lesions.Normal dentition.  Neck: normal, supple, no masses, no thyromegaly; mild tenderness over left greater than right trapezius muscles. Respiratory: clear to auscultation bilaterally, no wheezing, no crackles. Normal respiratory effort. No accessory muscle use.  Cardiovascular: S1, S2, with 2/6 systolic murmur. No extremity edema. 2+ pedal pulses. No carotid bruits.  Abdomen: Mild tenderness over the left lower quadrant/pelvic area; no masses palpated. No hepatosplenomegaly. Bowel sounds positive.  Musculoskeletal: no clubbing / cyanosis. Hypertrophic joint deformities from DJD in hands and knees. Good ROM, no contractures. Normal muscle tone. Discomfort of the left hip/pelvis with flexion and extension. Skin: Mild scant macular papular  rash appear to be resolving on arms and legs. Neurologic: CN 2-12 grossly intact. Sensation intact, Strength 5/5 in all except left lower extremity which is approximately 4 minus over 5 secondary to pain from the pelvic fracture.  Psychiatric: Pleasantly demented, but appears to answer most questions appropriately. Alert and oriented x 2. Normal mood.     Labs on Admission: I have personally reviewed following labs and imaging studies  CBC:  Recent Labs Lab 02/16/16 1118  WBC 20.0*  NEUTROABS 16.2*  HGB 13.7  HCT 40.4  MCV 94.0  PLT 278   Basic Metabolic Panel:  Recent Labs Lab 02/16/16 1118  NA 132*  K 3.9  CL 97*  CO2 27  GLUCOSE 107*  BUN 44*  CREATININE 1.15*  CALCIUM 8.6*   GFR: Estimated Creatinine Clearance: 21.5 mL/min (by C-G formula based on Cr of 1.15). Liver Function Tests:  Recent Labs Lab 02/16/16 1118  AST 19  ALT 16  ALKPHOS 66  BILITOT 0.8  PROT 6.8  ALBUMIN 3.9   No results for input(s): LIPASE, AMYLASE in the last 168 hours. No results for input(s): AMMONIA in the last 168 hours. Coagulation Profile: No results for input(s): INR, PROTIME in the last 168 hours. Cardiac Enzymes: No results for input(s): CKTOTAL, CKMB, CKMBINDEX, TROPONINI in the last 168 hours. BNP (last 3 results) No results for input(s): PROBNP in the last 8760 hours. HbA1C: No results for input(s): HGBA1C in the last 72 hours. CBG: No results for input(s): GLUCAP in the last 168 hours. Lipid Profile: No results for input(s): CHOL, HDL, LDLCALC, TRIG, CHOLHDL, LDLDIRECT in the last 72 hours. Thyroid Function Tests: No results for input(s): TSH, T4TOTAL, FREET4, T3FREE, THYROIDAB in the last 72 hours. Anemia Panel: No results for input(s): VITAMINB12, FOLATE, FERRITIN, TIBC, IRON, RETICCTPCT in the last 72 hours. Urine analysis:    Component Value Date/Time   COLORURINE YELLOW 02/16/2016 1031   APPEARANCEUR HAZY* 02/16/2016 1031   LABSPEC 1.025 02/16/2016 1031     PHURINE 6.0 02/16/2016 1031   GLUCOSEU NEGATIVE 02/16/2016 1031   HGBUR TRACE* 02/16/2016 1031   BILIRUBINUR NEGATIVE 02/16/2016 1031   KETONESUR NEGATIVE 02/16/2016 1031  PROTEINUR 30* 02/16/2016 1031   UROBILINOGEN 0.2 06/21/2015 0109   NITRITE NEGATIVE 02/16/2016 1031   LEUKOCYTESUR SMALL* 02/16/2016 1031   Sepsis Labs: !!!!!!!!!!!!!!!!!!!!!!!!!!!!!!!!!!!!!!!!!!!! @LABRCNTIP (procalcitonin:4,lacticidven:4) )No results found for this or any previous visit (from the past 240 hour(s)).   Radiological Exams on Admission: Ct Head Wo Contrast  02/16/2016  CLINICAL DATA:  Altered mental status. Fall 2 days ago. Initial encounter. EXAM: CT HEAD WITHOUT CONTRAST TECHNIQUE: Contiguous axial images were obtained from the base of the skull through the vertex without intravenous contrast. COMPARISON:  02/12/2016 FINDINGS: Brain: No evidence of acute infarction, hemorrhage, hydrocephalus, or mass lesion/mass effect. Moderate generalized atrophy with ventriculomegaly. Age congruent, mild periventricular chronic microvascular ischemic change. Vascular: No hyperdense vessel or unexpected calcification. Skull: Negative for fracture or focal lesion. Sinuses/Orbits: Bilateral cataract resection. No acute or posttraumatic finding. Other: None. IMPRESSION: 1. No acute finding or change from prior. 2. Moderate atrophy. Electronically Signed   By: Marnee Spring M.D.   On: 02/16/2016 12:23   Ct Pelvis Wo Contrast  02/16/2016  CLINICAL DATA:  Fall 2 days ago, landing on buttocks. Low back pain. EXAM: CT PELVIS WITHOUT CONTRAST TECHNIQUE: Multidetector CT imaging of the pelvis was performed following the standard protocol without intravenous contrast. COMPARISON:  08/11/2014 FINDINGS: Old right femoral neck fracture with changes of internal fixation. Fractures through the left superior and inferior pubic ramus. No acute femoral neck fracture. No subluxation or dislocation. Pelvic soft tissues are unremarkable. Large  stool burden in the rectum. IMPRESSION: Fractures through the left superior and inferior pubic rami. Electronically Signed   By: Charlett Nose M.D.   On: 02/16/2016 12:25    EKG: Not ordered in the ED.   Assessment/Plan Principal Problem:   Encephalopathy acute Active Problems:   Fracture of multiple pubic rami (HCC)   Infection of urinary tract   Hyponatremia   Essential hypertension   Hypothyroidism   Dementia   CKD (chronic kidney disease) stage 3, GFR 30-59 ml/min   PUD (peptic ulcer disease)    1. Acute encephalopathy, likely secondary to the urinary tract infection. Steroid-induced delirium could also be a possibility. She is currently back to baseline per her family, but she does have some underlying dementia. 2. Urinary tract infection. Urine culture was sent. 3. Fracture of the left pubic rami. The patient will likely need short-term skilled nursing facility placement for rehabilitation. 4. Chronic kidney disease. Her creatinine is at baseline. The elevated BUN may be secondary to recent steroid therapy. 5. Hyponatremia. Serum sodium of 132 is likely from hypovolemia. 6. Peptic ulcer disease. The patient is treated chronically with omeprazole. 7. Hypertension. Currently stable. She is treated chronically with Cozaar. 8. Hypothyroidism. 9. Dementia. Currently stable. She is treated chronically with Aricept and Namenda. 10. Leukocytosis, likely from recent steroid treatment and the infection. 11. Recent maculopapular rash of unknown etiology. It appears to be resolving. Prednisone was discontinued prior to hospitalization.   Plan: 1. The patient was given Cipro in the ED. Will continue Cipro per pharmacy. Urine culture is pending. 2. Will start gentle IV fluids. 3. Continue her chronic medications. 4. Supportive treatment with Tylenol and/or Vicodin for pain. 5. Will consult physical therapy as the patient may need short-term skilled nursing facility placement. 6. Check  follow-up laboratory studies tomorrow morning including TSH.   DVT prophylaxis: Lovenox Code Status: Full code Family Communication: Discussed with son Disposition Plan: None Consults called: None Admission status: Inpatient telemetry>>med Teodoro Spray MD [facility] Pager [phone]  If 7PM-7AM, please contact night-coverage [email or web address] Password [facility]  02/16/2016, 5:55 PM

## 2016-02-16 NOTE — Progress Notes (Signed)
Pharmacy Antibiotic Note  [name] is a 82 y.o. female admitted on 02/16/2016 with UTI.  Pharmacy has been consulted for Cipro dosing.  Plan: Cipro 400mg  IV every 24. Monitor labs, micro and vitals.   Height: 5' (152.4 cm) Weight: 106 lb 7.7 oz (48.3 kg) IBW/kg (Calculated) : 45.5  Temp (24hrs), Avg:97.7 F (36.5 C), Min:97.5 F (36.4 C), Max:97.8 F (36.6 C)   Recent Labs Lab 02/16/16 1118  WBC 20.0*  CREATININE 1.15*    Estimated Creatinine Clearance: 21.5 mL/min (by C-G formula based on Cr of 1.15).    Allergies  Allergen Reactions  . Amoxicillin-Pot Clavulanate     REACTION: UNKNOWN REACTION  . Cephalexin     REACTION: UNKNOWN REACTION  . Nsaids     UNKNOWN REACTION    Antimicrobials this admission: Cipro 6/27 >>   Dose adjustments this admission: Cipro  Microbiology results: 6/27 UCx: penging    6/27 MRSA PCR: pending  Thank you for allowing pharmacy to be a part of this patient's care.  Mady GemmaHayes, Jacqulene Huntley R 02/16/2016 5:52 PM

## 2016-02-17 ENCOUNTER — Inpatient Hospital Stay (HOSPITAL_COMMUNITY): Payer: Medicare Other

## 2016-02-17 DIAGNOSIS — G9340 Encephalopathy, unspecified: Secondary | ICD-10-CM

## 2016-02-17 DIAGNOSIS — N183 Chronic kidney disease, stage 3 (moderate): Secondary | ICD-10-CM

## 2016-02-17 DIAGNOSIS — S32502D Unspecified fracture of left pubis, subsequent encounter for fracture with routine healing: Secondary | ICD-10-CM

## 2016-02-17 LAB — BASIC METABOLIC PANEL
ANION GAP: 5 (ref 5–15)
BUN: 31 mg/dL — AB (ref 6–20)
CALCIUM: 8.2 mg/dL — AB (ref 8.9–10.3)
CO2: 25 mmol/L (ref 22–32)
Chloride: 104 mmol/L (ref 101–111)
Creatinine, Ser: 1.04 mg/dL — ABNORMAL HIGH (ref 0.44–1.00)
GFR calc Af Amer: 52 mL/min — ABNORMAL LOW (ref >60)
GFR, EST NON AFRICAN AMERICAN: 45 mL/min — AB (ref >60)
GLUCOSE: 91 mg/dL (ref 65–99)
POTASSIUM: 3.9 mmol/L (ref 3.5–5.1)
SODIUM: 134 mmol/L — AB (ref 135–145)

## 2016-02-17 LAB — TSH: TSH: 0.082 u[IU]/mL — AB (ref 0.350–4.500)

## 2016-02-17 LAB — CBC
HEMATOCRIT: 37.1 % (ref 36.0–46.0)
Hemoglobin: 12.6 g/dL (ref 12.0–15.0)
MCH: 32.3 pg (ref 26.0–34.0)
MCHC: 34 g/dL (ref 30.0–36.0)
MCV: 95.1 fL (ref 78.0–100.0)
PLATELETS: 267 10*3/uL (ref 150–400)
RBC: 3.9 MIL/uL (ref 3.87–5.11)
RDW: 12.8 % (ref 11.5–15.5)
WBC: 14.7 10*3/uL — AB (ref 4.0–10.5)

## 2016-02-17 MED ORDER — HYDROCORTISONE 1 % EX CREA: TOPICAL_CREAM | Freq: Two times a day (BID) | CUTANEOUS | Status: DC | PRN

## 2016-02-17 NOTE — Care Management Important Message (Signed)
Important Message  Patient Details  Name: [name] P Stines MRN: [id number] Date of Birth: November 27, 1919   Medicare Important Message Given:  Yes    Malcolm MetroChildress, Francesca Strome Demske, RN 02/17/2016, 12:15 PM

## 2016-02-17 NOTE — Progress Notes (Signed)
PROGRESS NOTE    [name]  RJJ:[id number] DOB: 12/30/1919 DOA: 02/16/2016 PCP: Colette RibasGOLDING, JOHN CABOT, MD     Brief Narrative:  82 year old woman admitted on 6/27 for left hip pain following several falls last week at her ALF. She was found to have left pelvic fractures on her CT scan. Admission has been requested for pain control and PT evaluation.   Assessment & Plan:   Principal Problem:   Encephalopathy acute Active Problems:   Essential hypertension   Hypothyroidism   Dementia   CKD (chronic kidney disease) stage 3, GFR 30-59 ml/min   Fracture of multiple pubic rami (HCC)   PUD (peptic ulcer disease)   Hyponatremia   Infection of urinary tract   Acute encephalopathy -Resolved, patient's family agrees that she is currently at baseline.  Left pelvic fractures -Pain is moderately well controlled on oral medications, seen by physical therapy with recommendations for SNF.  UTI  -continue Cipro pending culture data.  Chronic kidney disease stage II to 3 -Creatinine remains at baseline around 1.1.  Dementia -At baseline, continue Namenda and Aricept.  History of peptic ulcer disease -Continue PPI.   DVT prophylaxis: Lovenox Code Status: DNR Family Communication: Sons at bedside updated on plan of care Disposition Plan: SNF likely  Consultants:   None  Procedures:   None  Antimicrobials:   Cipro    Subjective: Feels better, pain is improved  Objective: Filed Vitals:   02/16/16 1430 02/16/16 1545 02/16/16 2211 02/17/16 1423  BP: 150/75 139/55 159/60 104/45  Pulse: 63 63 65 59  Temp:  97.5 F (36.4 C) 98.8 F (37.1 C) 98.5 F (36.9 C)  TempSrc:  Axillary Oral Oral  Resp:  16 20 18   Height:  5' (1.524 m)    Weight:  48.3 kg (106 lb 7.7 oz)    SpO2: 92% 99% 95% 92%    Intake/Output Summary (Last 24 hours) at 02/17/16 1624 Last data filed at 02/17/16 1423  Gross per 24 hour  Intake    720 ml  Output    300 ml  Net    420 ml   Filed  Weights   02/16/16 1011 02/16/16 1545  Weight: 46.267 kg (102 lb) 48.3 kg (106 lb 7.7 oz)    Examination:  General exam: Alert, awake, oriented x 3 Respiratory system: Clear to auscultation. Respiratory effort normal. Cardiovascular system:RRR. No murmurs, rubs, gallops. Gastrointestinal system: Abdomen is nondistended, soft and nontender. No organomegaly or masses felt. Normal bowel sounds heard. Central nervous system: Alert and oriented. No focal neurological deficits. Extremities: No C/C/E, +pedal pulses Skin: No rashes, lesions or ulcers Psychiatry: Judgement and insight appear normal. Mood & affect appropriate.     Data Reviewed: I have personally reviewed following labs and imaging studies  CBC:  Recent Labs Lab 02/16/16 1118 02/17/16 0607  WBC 20.0* 14.7*  NEUTROABS 16.2*  --   HGB 13.7 12.6  HCT 40.4 37.1  MCV 94.0 95.1  PLT 278 267   Basic Metabolic Panel:  Recent Labs Lab 02/16/16 1118 02/17/16 0607  NA 132* 134*  K 3.9 3.9  CL 97* 104  CO2 27 25  GLUCOSE 107* 91  BUN 44* 31*  CREATININE 1.15* 1.04*  CALCIUM 8.6* 8.2*   GFR: Estimated Creatinine Clearance: 23.8 mL/min (by C-G formula based on Cr of 1.04). Liver Function Tests:  Recent Labs Lab 02/16/16 1118  AST 19  ALT 16  ALKPHOS 66  BILITOT 0.8  PROT 6.8  ALBUMIN 3.9  No results for input(s): LIPASE, AMYLASE in the last 168 hours. No results for input(s): AMMONIA in the last 168 hours. Coagulation Profile: No results for input(s): INR, PROTIME in the last 168 hours. Cardiac Enzymes: No results for input(s): CKTOTAL, CKMB, CKMBINDEX, TROPONINI in the last 168 hours. BNP (last 3 results) No results for input(s): PROBNP in the last 8760 hours. HbA1C: No results for input(s): HGBA1C in the last 72 hours. CBG: No results for input(s): GLUCAP in the last 168 hours. Lipid Profile: No results for input(s): CHOL, HDL, LDLCALC, TRIG, CHOLHDL, LDLDIRECT in the last 72 hours. Thyroid  Function Tests:  Recent Labs  02/16/16 1118  TSH 0.082*   Anemia Panel: No results for input(s): VITAMINB12, FOLATE, FERRITIN, TIBC, IRON, RETICCTPCT in the last 72 hours. Urine analysis:    Component Value Date/Time   COLORURINE YELLOW 02/16/2016 1031   APPEARANCEUR HAZY* 02/16/2016 1031   LABSPEC 1.025 02/16/2016 1031   PHURINE 6.0 02/16/2016 1031   GLUCOSEU NEGATIVE 02/16/2016 1031   HGBUR TRACE* 02/16/2016 1031   BILIRUBINUR NEGATIVE 02/16/2016 1031   KETONESUR NEGATIVE 02/16/2016 1031   PROTEINUR 30* 02/16/2016 1031   UROBILINOGEN 0.2 06/21/2015 0109   NITRITE NEGATIVE 02/16/2016 1031   LEUKOCYTESUR SMALL* 02/16/2016 1031   Sepsis Labs: (procalcitonin:4,lacticidven:4)  ) Recent Results (from the past 240 hour(s))  MRSA PCR Screening     Status: None   Collection Time: 02/16/16  5:00 PM  Result Value Ref Range Status   MRSA by PCR NEGATIVE NEGATIVE Final    Comment:        The GeneXpert MRSA Assay (FDA approved for NASAL specimens only), is one component of a comprehensive MRSA colonization surveillance program. It is not intended to diagnose MRSA infection nor to guide or monitor treatment for MRSA infections.          Radiology Studies: Dg Pelvis 1-2 Views  02/17/2016  CLINICAL DATA:  Pelvic fracture. EXAM: PELVIS - 1-2 VIEW COMPARISON:  CT scan of February 16, 2016. FINDINGS: Status post surgical internal fixation of old right proximal femoral fracture. Nondisplaced fracture involving the ischial tuberosity is noted. Minimally displaced left superior ramus fracture is also noted. IMPRESSION: Minimally displaced left superior ramus fracture. Nondisplaced left ischial tuberosity fracture. Electronically Signed   By: Lupita Raider, M.D.   On: 02/17/2016 16:03   Ct Head Wo Contrast  02/16/2016  CLINICAL DATA:  Altered mental status. Fall 2 days ago. Initial encounter. EXAM: CT HEAD WITHOUT CONTRAST TECHNIQUE: Contiguous axial images were obtained  from the base of the skull through the vertex without intravenous contrast. COMPARISON:  02/12/2016 FINDINGS: Brain: No evidence of acute infarction, hemorrhage, hydrocephalus, or mass lesion/mass effect. Moderate generalized atrophy with ventriculomegaly. Age congruent, mild periventricular chronic microvascular ischemic change. Vascular: No hyperdense vessel or unexpected calcification. Skull: Negative for fracture or focal lesion. Sinuses/Orbits: Bilateral cataract resection. No acute or posttraumatic finding. Other: None. IMPRESSION: 1. No acute finding or change from prior. 2. Moderate atrophy. Electronically Signed   By: Marnee Spring M.D.   On: 02/16/2016 12:23   Ct Pelvis Wo Contrast  02/16/2016  CLINICAL DATA:  Fall 2 days ago, landing on buttocks. Low back pain. EXAM: CT PELVIS WITHOUT CONTRAST TECHNIQUE: Multidetector CT imaging of the pelvis was performed following the standard protocol without intravenous contrast. COMPARISON:  08/11/2014 FINDINGS: Old right femoral neck fracture with changes of internal fixation. Fractures through the left superior and inferior pubic ramus. No acute femoral neck fracture. No subluxation or dislocation.  Pelvic soft tissues are unremarkable. Large stool burden in the rectum. IMPRESSION: Fractures through the left superior and inferior pubic rami. Electronically Signed   By: Charlett NoseKevin  Dover M.D.   On: 02/16/2016 12:25        Scheduled Meds: . calcium carbonate  500 mg of elemental calcium Oral Daily  . ciprofloxacin  400 mg Intravenous Q24H  . donepezil  10 mg Oral Daily  . enoxaparin (LOVENOX) injection  30 mg Subcutaneous Q24H  . escitalopram  10 mg Oral Daily  . ferrous sulfate  325 mg Oral Daily  . levothyroxine  250 mcg Oral Once per day on Mon Tue Wed Thu Fri Sat   And  . [START ON 02/21/2016] levothyroxine  125 mcg Oral Once per day on Sun  . loratadine  10 mg Oral Daily  . losartan  50 mg Oral Daily  . memantine  10 mg Oral BID  . multivitamin  with minerals  1 tablet Oral Daily  . pantoprazole  40 mg Oral Daily  . polyvinyl alcohol   Both Eyes QID  . psyllium  1 packet Oral Daily   Continuous Infusions: . 0.9 % NaCl with KCl 20 mEq / L 65 mL/hr at 02/16/16 1942     LOS: 1 day    Time spent: 25 minutes. Greater than 50% of this time was spent in direct contact with the patient coordinating care.     Chaya JanHERNANDEZ ACOSTA,ESTELA, MD [facility] Pager [phone]  If 7PM-7AM, please contact night-coverage [email or web address] Password [email or web address] 02/17/2016, 4:24 PM

## 2016-02-17 NOTE — Progress Notes (Signed)
Patients family requesting a new xray of pelvis.  On call MD notified. No new orders at this time.  Note to be left for covering MD this AM to make decision on if new xray is needed.

## 2016-02-17 NOTE — Evaluation (Signed)
Physical Therapy Evaluation Patient Details Name: [name] MRN: [id number] DOB: [birth year] Today's Date: 02/17/2016   History of Present Illness  82 yo F admitted with increased confusion, s/p fall 4 days prior and went to the ED with CT scan of the head & C-spine (-), and lumbar XR (-). Recently began hallucinating and became lethargic, had a couple more falls at ALF with onset of lower back pain and L sided pelvic pain. Dx: Acute encephalopathy due to UTI, and steroid induced delirium, and Fx of the L pubic rami. PMH: PUD, HTN, HCAP in Oct. 2016, hypothyroidism, CKD, dementia, anemia, vertigo, R hip fx s/p IM nail, SDH Sept 2013, mastectomy, carpal tunnel release.  Clinical Impression  Pt received in bed, and was agreeable to PT evaluation.  Pt states she normally can ambulate wherever she wants to in the building at [facility], but she has to have her walker.  Today she required Min A for rolling each direction in the bed, and for supine<>sit.  Once sitting on the EOB she experienced extreme back pain and was not able to progress to standing.  Assisted pt back down into the bed, and she was able to perform LE exercises in the bed.  At this point, pt would benefit from SNF to increase strength, endurance, and functional mobility.      Follow Up Recommendations SNF    Equipment Recommendations  None recommended by PT    Recommendations for Other Services       Precautions / Restrictions Precautions Precautions: Back Precaution Comments: For pain reduction - log roll Restrictions Weight Bearing Restrictions: No      Mobility  Bed Mobility Overal bed mobility: Needs Assistance Bed Mobility: Rolling;Supine to Sit;Sit to Supine Rolling: Min assist   Supine to sit: Min assist Sit to supine: Min assist      Transfers Overall transfer level:  (Pt unable to perform sit<>stand transfer today due to intense pain in mid/lower back. )                  Ambulation/Gait                Stairs            Wheelchair Mobility    Modified Rankin (Stroke Patients Only)       Balance Overall balance assessment: Needs assistance         Standing balance support: Bilateral upper extremity supported Standing balance-Leahy Scale: Fair                               Pertinent Vitals/Pain Pain Assessment: Faces Pain Score: 8  Pain Location: mid back - with mobility Pain Intervention(s): Limited activity within patient's tolerance    Home Living   Living Arrangements: Other (Comment) ([facility]             Home Equipment: Emergency planning/management officerhower seat;Walker - 4 wheels;Cane - single point      Prior Function Level of Independence: Independent with assistive device(s)   Gait / Transfers Assistance Needed: Pt states she can ambulate by herself with her rollator, and she is able to go all over the building.   ADL's / Homemaking Assistance Needed: Pt dresses herself, pt has assistance with bathing.         Hand Dominance   Dominant Hand: Right    Extremity/Trunk Assessment   Upper Extremity Assessment: Generalized weakness  Lower Extremity Assessment: Generalized weakness         Communication   Communication: No difficulties  Cognition Arousal/Alertness: Awake/alert Behavior During Therapy: WFL for tasks assessed/performed Overall Cognitive Status: Impaired/Different from baseline Area of Impairment: Orientation Orientation Level: Situation;Disoriented to                  General Comments      Exercises General Exercises - Lower Extremity Ankle Circles/Pumps: Strengthening;Both;10 reps;Supine Short Arc Quad: Strengthening;Both;10 reps;Supine Heel Slides: Strengthening;Both;10 reps;Supine Hip ABduction/ADduction: Strengthening;Both;10 reps;Supine      Assessment/Plan    PT Assessment Patient needs continued PT services  PT Diagnosis Difficulty walking;Abnormality of  gait;Generalized weakness   PT Problem List Decreased strength;Decreased activity tolerance;Decreased balance;Decreased mobility;Decreased cognition;Decreased knowledge of use of DME;Decreased knowledge of precautions;Decreased safety awareness;Pain  PT Treatment Interventions DME instruction;Gait training;Functional mobility training;Therapeutic activities;Therapeutic exercise;Balance training;Patient/family education   PT Goals (Current goals can be found in the Care Plan section) Acute Rehab PT Goals Patient Stated Goal: Pt states she wants to learn how to do steps, but there is nowhere to practice them at [facility].  PT encouraged pt to progress with transfers and gait prior to steps.  PT Goal Formulation: With patient Time For Goal Achievement: 02/24/16 Potential to Achieve Goals: Good    Frequency Min 5X/week   Barriers to discharge        Co-evaluation               End of Session Equipment Utilized During Treatment: Gait belt Activity Tolerance: Patient limited by pain Patient left: in bed;with call bell/phone within reach;with bed alarm set      Functional Assessment Tool Used: [employer] AM-PAC "6-clicks"  Functional Limitation: Mobility: Walking and moving around Mobility: Walking and Moving Around Current Status [phone](G8978): At least 60 percent but less than 80 percent impaired, limited or restricted Mobility: Walking and Moving Around Goal Status [phone](G8979): At least 40 percent but less than 60 percent impaired, limited or restricted    Time: 0854-0930 PT Time Calculation (min) (ACUTE ONLY): 36 min   Charges:   PT Evaluation $PT Eval Low Complexity: 1 Procedure PT Treatments $Therapeutic Exercise: 8-22 mins   PT G Codes:   PT G-Codes **NOT FOR INPATIENT CLASS** Functional Assessment Tool Used: [employer] AM-PAC "6-clicks"  Functional Limitation: Mobility: Walking and moving around Mobility: Walking and Moving Around Current Status [phone](G8978): At least  60 percent but less than 80 percent impaired, limited or restricted Mobility: Walking and Moving Around Goal Status [phone](G8979): At least 40 percent but less than 60 percent impaired, limited or restricted    Beth Lorel Lembo, PT, DPT X: [phone]

## 2016-02-18 DIAGNOSIS — S32502K Unspecified fracture of left pubis, subsequent encounter for fracture with nonunion: Secondary | ICD-10-CM

## 2016-02-18 LAB — BASIC METABOLIC PANEL
Anion gap: 6 (ref 5–15)
BUN: 22 mg/dL — ABNORMAL HIGH (ref 6–20)
CALCIUM: 8.1 mg/dL — AB (ref 8.9–10.3)
CHLORIDE: 104 mmol/L (ref 101–111)
CO2: 24 mmol/L (ref 22–32)
Creatinine, Ser: 0.93 mg/dL (ref 0.44–1.00)
GFR calc Af Amer: 59 mL/min — ABNORMAL LOW (ref >60)
GFR, EST NON AFRICAN AMERICAN: 51 mL/min — AB (ref >60)
GLUCOSE: 86 mg/dL (ref 65–99)
Potassium: 4.1 mmol/L (ref 3.5–5.1)
Sodium: 134 mmol/L — ABNORMAL LOW (ref 135–145)

## 2016-02-18 LAB — CBC
HEMATOCRIT: 35.8 % — AB (ref 36.0–46.0)
HEMOGLOBIN: 12 g/dL (ref 12.0–15.0)
MCH: 32 pg (ref 26.0–34.0)
MCHC: 33.5 g/dL (ref 30.0–36.0)
MCV: 95.5 fL (ref 78.0–100.0)
Platelets: 248 10*3/uL (ref 150–400)
RBC: 3.75 MIL/uL — AB (ref 3.87–5.11)
RDW: 12.5 % (ref 11.5–15.5)
WBC: 14.2 10*3/uL — ABNORMAL HIGH (ref 4.0–10.5)

## 2016-02-18 NOTE — Clinical Social Work Note (Signed)
Clinical Social Work Assessment  Patient Details  Name: [name] P Hays MRN: [id number] Date of Birth: 01-18-1920  Date of referral:  02/18/16               Reason for consult:  Discharge Planning                Permission sought to share information with:    Permission granted to share information::     Name::        Agency::     Relationship::     Contact Information:     Housing/Transportation Living arrangements for the past 2 months:  Assisted Living Facility Source of Information:  Adult Children Patient Interpreter Needed:  None Criminal Activity/Legal Involvement Pertinent to Current Situation/Hospitalization:  No - Comment as needed Significant Relationships:  Adult Children Lives with:  Facility Resident Do you feel safe going back to the place where you live?   (needs rehab first) Need for family participation in patient care:  Yes (Comment)  Care giving concerns:  Pt is from ALF and will need higher level of care.    Social Worker assessment / plan:  CSW attempted to meet with pt at bedside, but pt sleeping soundly. CSW spoke with pt's son, [name] on phone. He reports pt has been a resident at [facility] for many years. Family is local and visit often. Pt fell at facility and has left pelvic fractures. PT evaluated pt and recommend SNF. CSW discussed placement process, including Medicare coverage/criteria. [name] indicates [facility] the past and this would be their preference again. Family plan for pt to complete rehab and return to [facility]. He will discuss with [facility] today. At baseline, pt ambulates with a walker and requires limited assist with ADLs. CSW will initiate bed search.   Employment status:  Retired Health and safety inspectornsurance information:  Medicare PT Recommendations:  Skilled Nursing Facility Information / Referral to community resources:  Skilled Nursing Facility  Patient/Family's Response to care:  Pt's son requests SNF prior to return to [facility].    Patient/Family's Understanding of and Emotional Response to Diagnosis, Current Treatment, and Prognosis:  Pt's son aware of admission diagnosis and treatment plan. Requesting information regarding when pt will be d/c. CSW agreed to follow up later today.   Emotional Assessment Appearance:  Appears stated age Attitude/Demeanor/Rapport:  Unable to Assess Affect (typically observed):  Unable to Assess Orientation:    Alcohol / Substance use:  Not Applicable Psych involvement (Current and /or in the community):  No (Comment)  Discharge Needs  Concerns to be addressed:  Discharge Planning Concerns Readmission within the last 30 days:  No Current discharge risk:  Physical Impairment Barriers to Discharge:  No Barriers Identified   Karn CassisStultz, Neema Fluegge [place], LCSW 02/18/2016, 9:13 AM [phone]

## 2016-02-18 NOTE — Clinical Social Work Placement (Signed)
   CLINICAL SOCIAL WORK PLACEMENT  NOTE  Date:  02/18/2016  Patient Details  Name: [name] MRN: [id number] Date of Birth: [birth year]  Clinical Social Work is seeking post-discharge placement for this patient at the Skilled  Nursing Facility level of care (*CSW will initial, date and re-position this form in  chart as items are completed):  Yes   Patient/family provided with [facility] Clinical Social Work Department's list of facilities offering this level of care within the geographic area requested by the patient (or if unable, by the patient's family).  Yes   Patient/family informed of their freedom to choose among providers that offer the needed level of care, that participate in Medicare, Medicaid or managed care program needed by the patient, have an available bed and are willing to accept the patient.  Yes   Patient/family informed of 's ownership interest in [facility] and [facility], as well as of the fact that they are under no obligation to receive care at these facilities.  PASRR submitted to EDS on       PASRR number received on       Existing PASRR number confirmed on 02/18/16     FL2 transmitted to all facilities in geographic area requested by pt/family on 02/18/16     FL2 transmitted to all facilities within larger geographic area on       Patient informed that his/her managed care company has contracts with or will negotiate with certain facilities, including the following:            Patient/family informed of bed offers received.  Patient chooses bed at       Physician recommends and patient chooses bed at      Patient to be transferred to   on  .  Patient to be transferred to facility by       Patient family notified on   of transfer.  Name of family member notified:        PHYSICIAN       Additional Comment:    _______________________________________________ Karn CassisStultz, Hazim Treadway [place], LCSW 02/18/2016, 9:09  AM [phone]

## 2016-02-18 NOTE — NC FL2 (Signed)
[place] MEDICAID FL2 LEVEL OF CARE SCREENING TOOL     IDENTIFICATION  Patient Name: [name] Birthdate: Dec 30, 1919 Sex: female Admission Date (Current Location): 02/16/2016  [facility]   Facility and Address:  [facility],  [address], [name] [id number]      Provider Number: [id number]  Attending Physician Name and Address:  Micael HampshireEstela Y Hernandez Acost*  Relative Name and Phone Number:       Current Level of Care: Hospital Recommended Level of Care: Skilled Nursing Facility Prior Approval Number:    Date Approved/Denied:   PASRR Number: [id number]  Discharge Plan: SNF    Current Diagnoses: Patient Active Problem List   Diagnosis Date Noted  . Fracture of multiple pubic rami (HCC) 02/16/2016  . PUD (peptic ulcer disease) 02/16/2016  . Hyponatremia 02/16/2016  . Infection of urinary tract 02/16/2016  . Acute respiratory failure with hypoxia (HCC) 06/07/2015  . GERD (gastroesophageal reflux disease) 06/07/2015  . CKD (chronic kidney disease) stage 3, GFR 30-59 ml/min 06/07/2015  . HCAP (healthcare-associated pneumonia) 06/06/2015  . Fever 05/21/2012  . Encephalopathy acute 05/21/2012  . Dementia 05/21/2012  . History of fracture of right hip 05/21/2012  . History of subdural hematoma (post traumatic) 05/21/2012  . Hemiparesis (HCC) 05/15/2012  . Hypoxemia 05/15/2012  . Hypothyroidism 05/14/2012  . Anemia 05/14/2012  . GASTRIC ULCER 09/23/2009  . GI BLEEDING 08/20/2009  . DIARRHEA, ACUTE 08/18/2009  . Peptic ulcer 07/31/2009  . HEMATEMESIS 07/31/2009  . Essential hypertension 07/30/2009  . DIVERTICULOSIS OF COLON 07/30/2009  . ARTHRITIS, BACK 07/30/2009  . OSTEOPOROSIS 07/30/2009  . DYSPHAGIA UNSPECIFIED 07/30/2009    Orientation RESPIRATION BLADDER Height & Weight     Self, Place  Normal Incontinent Weight: 106 lb 7.7 oz (48.3 kg) Height:  5' (152.4 cm)  BEHAVIORAL SYMPTOMS/MOOD NEUROLOGICAL BOWEL  NUTRITION STATUS  Other (Comment) (n/a)  (n/a) Continent Diet (Carb modified)  AMBULATORY STATUS COMMUNICATION OF NEEDS Skin   Total Care Verbally Other (Comment) (Slight redness to sacrum. Scattered rash over body.)                       Personal Care Assistance Level of Assistance  Bathing, Feeding, Dressing Bathing Assistance: Maximum assistance Feeding assistance: Limited assistance Dressing Assistance: Maximum assistance     Functional Limitations Info  Sight, Hearing, Speech Sight Info: Adequate Hearing Info: Adequate Speech Info: Adequate    SPECIAL CARE FACTORS FREQUENCY  PT (By licensed PT)     PT Frequency: 5              Contractures Contractures Info: Not present    Additional Factors Info  Code Status, Allergies Code Status Info: DNR Allergies Info: Amoxicillin-pot clavulanate, Cephalexin, Nsaids           Current Medications (02/18/2016):  This is the current hospital active medication list Current Facility-Administered Medications  Medication Dose Route Frequency Provider Last Rate Last Dose  . 0.9 % NaCl with KCl 20 mEq/ L  infusion   Intravenous Continuous Elliot Cousinenise Fisher, MD 65 mL/hr at 02/16/16 1942    . acetaminophen (TYLENOL) tablet 650 mg  650 mg Oral Q6H PRN Elliot Cousinenise Fisher, MD   650 mg at 02/16/16 1739  . albuterol (PROVENTIL) (2.5 MG/3ML) 0.083% nebulizer solution 2.5 mg  2.5 mg Nebulization Q2H PRN Elliot Cousinenise Fisher, MD      . calcium carbonate (OS-CAL - dosed in mg of elemental calcium) tablet 500 mg of elemental  calcium  500 mg of elemental calcium Oral Daily Elliot Cousinenise Fisher, MD   500 mg of elemental calcium at 02/17/16 1124  . ciprofloxacin (CIPRO) IVPB 400 mg  400 mg Intravenous Q24H Elliot Cousinenise Fisher, MD   400 mg at 02/17/16 1126  . donepezil (ARICEPT) tablet 10 mg  10 mg Oral Daily Elliot Cousinenise Fisher, MD   10 mg at 02/17/16 1122  . enoxaparin (LOVENOX) injection 30 mg  30 mg Subcutaneous Q24H Elliot Cousinenise Fisher, MD   30 mg at 02/17/16 1823  .  escitalopram (LEXAPRO) tablet 10 mg  10 mg Oral Daily Elliot Cousinenise Fisher, MD   10 mg at 02/17/16 1000  . feeding supplement (ENSURE ENLIVE) (ENSURE ENLIVE) liquid 237 mL  237 mL Oral PRN Elliot Cousinenise Fisher, MD      . ferrous sulfate tablet 325 mg  325 mg Oral Daily Elliot Cousinenise Fisher, MD   325 mg at 02/17/16 1125  . HYDROcodone-acetaminophen (NORCO/VICODIN) 5-325 MG per tablet 1 tablet  1 tablet Oral Q4H PRN Elliot Cousinenise Fisher, MD   1 tablet at 02/18/16 0742  . hydrocortisone cream 1 %   Topical BID PRN Henderson CloudEstela Y Hernandez Acosta, MD      . levothyroxine (SYNTHROID, LEVOTHROID) tablet 250 mcg  250 mcg Oral Once per day on Mon Tue Wed Thu Fri Sat Elliot Cousinenise Fisher, MD   250 mcg at 02/18/16 16100743   And  . [START ON 02/21/2016] levothyroxine (SYNTHROID, LEVOTHROID) tablet 125 mcg  125 mcg Oral Once per day on Sun Elliot Cousinenise Fisher, MD      . loperamide (IMODIUM) capsule 2 mg  2 mg Oral BID PRN Elliot Cousinenise Fisher, MD      . loratadine (CLARITIN) tablet 10 mg  10 mg Oral Daily Elliot Cousinenise Fisher, MD   10 mg at 02/17/16 1123  . losartan (COZAAR) tablet 50 mg  50 mg Oral Daily Elliot Cousinenise Fisher, MD   50 mg at 02/17/16 1125  . meclizine (ANTIVERT) tablet 25 mg  25 mg Oral TID PRN Elliot Cousinenise Fisher, MD      . memantine [facility](NAMENDA) tablet 10 mg  10 mg Oral BID Elliot Cousinenise Fisher, MD   10 mg at 02/17/16 2151  . multivitamin with minerals tablet 1 tablet  1 tablet Oral Daily Elliot Cousinenise Fisher, MD   1 tablet at 02/17/16 1123  . ondansetron (ZOFRAN) tablet 4 mg  4 mg Oral Q6H PRN Elliot Cousinenise Fisher, MD       Or  . ondansetron [facility](ZOFRAN) injection 4 mg  4 mg Intravenous Q6H PRN Elliot Cousinenise Fisher, MD      . pantoprazole (PROTONIX) EC tablet 40 mg  40 mg Oral Daily Elliot Cousinenise Fisher, MD   40 mg at 02/17/16 1124  . polyvinyl alcohol (LIQUIFILM TEARS) 1.4 % ophthalmic solution   Both Eyes QID Elliot Cousinenise Fisher, MD      . psyllium (HYDROCIL/METAMUCIL) packet 1 packet  1 packet Oral Daily Elliot Cousinenise Fisher, MD   1 packet at 02/17/16 1125     Discharge Medications: Please see discharge summary  for a list of discharge medications.  Relevant Imaging Results:  Relevant Lab Results:   Additional Information SS#: [id number]. From ALF.   Derenda FennelStultz, Markell Sciascia GrimesShanaberger, [place]CS[place] [phone]

## 2016-02-18 NOTE — Progress Notes (Signed)
PROGRESS NOTE    [name]  WGN:[id number] DOB: [birth year] DOA: 02/16/2016 PCP: Colette RibasGOLDING, JOHN CABOT, MD     Brief Narrative:  82 year old woman admitted on 6/27 for left hip pain following several falls last week at her ALF. She was found to have left pelvic fractures on her CT scan. Admission has been requested for pain control and PT evaluation.   Assessment & Plan:   Principal Problem:   Encephalopathy acute Active Problems:   Essential hypertension   Hypothyroidism   Dementia   CKD (chronic kidney disease) stage 3, GFR 30-59 ml/min   Fracture of multiple pubic rami (HCC)   PUD (peptic ulcer disease)   Hyponatremia   Infection of urinary tract   Acute encephalopathy -Resolved, patient's family agrees that she is currently at baseline.  Left pelvic fractures -Pain is moderately well controlled on oral medications, seen by physical therapy with recommendations for SNF.  E Coli UTI  -continue Cipro pending sensitivities.  Chronic kidney disease stage II to 3 -Creatinine remains at baseline around 1.1.  Dementia -At baseline, continue Namenda and Aricept.  History of peptic ulcer disease -Continue PPI.   DVT prophylaxis: Lovenox Code Status: DNR Family Communication: Daughter-in-law at bedside updated on plan of care Disposition Plan: SNF in 24 hours  Consultants:   None  Procedures:   None  Antimicrobials:   Cipro    Subjective: Feels better, pain is improved  Objective: Filed Vitals:   02/17/16 1423 02/17/16 2126 02/18/16 0556 02/18/16 1331  BP: 104/45 133/55 182/63 137/72  Pulse: 59 69 68 67  Temp: 98.5 F (36.9 C) 98.4 F (36.9 C) 98.2 F (36.8 C) 98.2 F (36.8 C)  TempSrc: Oral Oral Oral Oral  Resp: 18 20 16 18   Height:      Weight:      SpO2: 92% 95% 98% 95%    Intake/Output Summary (Last 24 hours) at 02/18/16 1635 Last data filed at 02/18/16 1210  Gross per 24 hour  Intake    360 ml  Output    200 ml  Net    160  ml   Filed Weights   02/16/16 1011 02/16/16 1545  Weight: 46.267 kg (102 lb) 48.3 kg (106 lb 7.7 oz)    Examination:  General exam: Alert, awake, oriented x 3 Respiratory system: Clear to auscultation. Respiratory effort normal. Cardiovascular system:RRR. No murmurs, rubs, gallops. Gastrointestinal system: Abdomen is nondistended, soft and nontender. No organomegaly or masses felt. Normal bowel sounds heard. Central nervous system: Alert and oriented. No focal neurological deficits. Extremities: No C/C/E, +pedal pulses Skin: No rashes, lesions or ulcers Psychiatry: Judgement and insight appear normal. Mood & affect appropriate.     Data Reviewed: I have personally reviewed following labs and imaging studies  CBC:  Recent Labs Lab 02/16/16 1118 02/17/16 0607 02/18/16 0624  WBC 20.0* 14.7* 14.2*  NEUTROABS 16.2*  --   --   HGB 13.7 12.6 12.0  HCT 40.4 37.1 35.8*  MCV 94.0 95.1 95.5  PLT 278 267 248   Basic Metabolic Panel:  Recent Labs Lab 02/16/16 1118 02/17/16 0607 02/18/16 0624  NA 132* 134* 134*  K 3.9 3.9 4.1  CL 97* 104 104  CO2 27 25 24   GLUCOSE 107* 91 86  BUN 44* 31* 22*  CREATININE 1.15* 1.04* 0.93  CALCIUM 8.6* 8.2* 8.1*   GFR: Estimated Creatinine Clearance: 26.6 mL/min (by C-G formula based on Cr of 0.93). Liver Function Tests:  Recent Labs Lab  02/16/16 1118  AST 19  ALT 16  ALKPHOS 66  BILITOT 0.8  PROT 6.8  ALBUMIN 3.9   No results for input(s): LIPASE, AMYLASE in the last 168 hours. No results for input(s): AMMONIA in the last 168 hours. Coagulation Profile: No results for input(s): INR, PROTIME in the last 168 hours. Cardiac Enzymes: No results for input(s): CKTOTAL, CKMB, CKMBINDEX, TROPONINI in the last 168 hours. BNP (last 3 results) No results for input(s): PROBNP in the last 8760 hours. HbA1C: No results for input(s): HGBA1C in the last 72 hours. CBG: No results for input(s): GLUCAP in the last 168 hours. Lipid  Profile: No results for input(s): CHOL, HDL, LDLCALC, TRIG, CHOLHDL, LDLDIRECT in the last 72 hours. Thyroid Function Tests:  Recent Labs  02/16/16 1118  TSH 0.082*   Anemia Panel: No results for input(s): VITAMINB12, FOLATE, FERRITIN, TIBC, IRON, RETICCTPCT in the last 72 hours. Urine analysis:    Component Value Date/Time   COLORURINE YELLOW 02/16/2016 1031   APPEARANCEUR HAZY* 02/16/2016 1031   LABSPEC 1.025 02/16/2016 1031   PHURINE 6.0 02/16/2016 1031   GLUCOSEU NEGATIVE 02/16/2016 1031   HGBUR TRACE* 02/16/2016 1031   BILIRUBINUR NEGATIVE 02/16/2016 1031   KETONESUR NEGATIVE 02/16/2016 1031   PROTEINUR 30* 02/16/2016 1031   UROBILINOGEN 0.2 06/21/2015 0109   NITRITE NEGATIVE 02/16/2016 1031   LEUKOCYTESUR SMALL* 02/16/2016 1031   Sepsis Labs: (procalcitonin:4,lacticidven:4)  ) Recent Results (from the past 240 hour(s))  Urine culture     Status: Abnormal (Preliminary result)   Collection Time: 02/16/16 10:30 AM  Result Value Ref Range Status   Specimen Description URINE, CATHETERIZED  Final   Special Requests NONE  Final   Culture >=100,000 COLONIES/mL ESCHERICHIA COLI (A)  Final   Report Status PENDING  Incomplete  MRSA PCR Screening     Status: None   Collection Time: 02/16/16  5:00 PM  Result Value Ref Range Status   MRSA by PCR NEGATIVE NEGATIVE Final    Comment:        The GeneXpert MRSA Assay (FDA approved for NASAL specimens only), is one component of a comprehensive MRSA colonization surveillance program. It is not intended to diagnose MRSA infection nor to guide or monitor treatment for MRSA infections.          Radiology Studies: Dg Pelvis 1-2 Views  02/17/2016  CLINICAL DATA:  Pelvic fracture. EXAM: PELVIS - 1-2 VIEW COMPARISON:  CT scan of February 16, 2016. FINDINGS: Status post surgical internal fixation of old right proximal femoral fracture. Nondisplaced fracture involving the ischial tuberosity is noted. Minimally displaced  left superior ramus fracture is also noted. IMPRESSION: Minimally displaced left superior ramus fracture. Nondisplaced left ischial tuberosity fracture. Electronically Signed   By: Lupita Raider, M.D.   On: 02/17/2016 16:03        Scheduled Meds: . calcium carbonate  500 mg of elemental calcium Oral Daily  . ciprofloxacin  400 mg Intravenous Q24H  . donepezil  10 mg Oral Daily  . enoxaparin (LOVENOX) injection  30 mg Subcutaneous Q24H  . escitalopram  10 mg Oral Daily  . ferrous sulfate  325 mg Oral Daily  . levothyroxine  250 mcg Oral Once per day on Mon Tue Wed Thu Fri Sat   And  . [START ON 02/21/2016] levothyroxine  125 mcg Oral Once per day on Sun  . loratadine  10 mg Oral Daily  . losartan  50 mg Oral Daily  . memantine  10 mg Oral  BID  . multivitamin with minerals  1 tablet Oral Daily  . pantoprazole  40 mg Oral Daily  . polyvinyl alcohol   Both Eyes QID  . psyllium  1 packet Oral Daily   Continuous Infusions: . 0.9 % NaCl with KCl 20 mEq / L 65 mL/hr at 02/16/16 1942     LOS: 2 days    Time spent: 25 minutes. Greater than 50% of this time was spent in direct contact with the patient coordinating care.     Chaya JanHERNANDEZ ACOSTA,ESTELA, MD [facility] Pager [phone]  If 7PM-7AM, please contact night-coverage [email or web address] Password [email or web address] 02/18/2016, 4:35 PM

## 2016-02-18 NOTE — Progress Notes (Signed)
Physical Therapy Treatment Patient Details Name: [name] MRN: [id number] DOB: 06/05/1920 Today's Date: 02/18/2016    History of Present Illness 82 yo F admitted with increased confusion, s/p fall 4 days prior and went to the ED with CT scan of the head & C-spine (-), and lumbar XR (-). Recently began hallucinating and became lethargic, had a couple more falls at ALF with onset of lower back pain and L sided pelvic pain. Dx: Acute encephalopathy due to UTI, and steroid induced delirium, and Fx of the L pubic rami. PMH: PUD, HTN, HCAP in Oct. 2016, hypothyroidism, CKD, dementia, anemia, vertigo, R hip fx s/p IM nail, SDH Sept 2013, mastectomy, carpal tunnel release.    PT Comments    Pt presents in supine today, and was agreeable to PT tx.  Pt expressed that she was very fatigued today, but willing to attempt PT.  During tx, she was only able to sit on the EOB due to intense lower back and sacral pain.  This inhibited her from standing trials.  May try SARA lift for sit<>stand trial at next tx.   Pt would continue to benefit from skilled PT at SNF level to progress her mobility level, and decrease pain.    Follow Up Recommendations  SNF     Equipment Recommendations  None recommended by PT    Recommendations for Other Services       Precautions / Restrictions Precautions Precautions: Back Precaution Comments: For pain reduction - log roll Restrictions Weight Bearing Restrictions: No    Mobility  Bed Mobility Overal bed mobility: Needs Assistance Bed Mobility: Supine to Sit;Sit to Supine Rolling: Mod assist   Supine to sit: Min assist;HOB elevated Sit to supine: Max assist   General bed mobility comments: Pt was able to sit on the EOB for ~10 min.  Pt expressed increased pain with sitting on the EOB, and became very anxious.    Transfers Overall transfer level:  (Pt only willing to make 1 attempt at sit<>stand due to increased pain with sitting.  )                   Ambulation/Gait                 Stairs            Wheelchair Mobility    Modified Rankin (Stroke Patients Only)       Balance Overall balance assessment: Needs assistance Sitting-balance support: Bilateral upper extremity supported Sitting balance-Leahy Scale: Fair Sitting balance - Comments: supervision due to pt wanting to quickly lay herself back into the bed, therefore supevision required to ensure safety with transfer.                             Cognition Arousal/Alertness: Awake/alert Behavior During Therapy: WFL for tasks assessed/performed Overall Cognitive Status: Impaired/Different from baseline Area of Impairment: Orientation Orientation Level: Situation;Disoriented to                  Exercises      General Comments        Pertinent Vitals/Pain Pain Assessment: Faces Faces Pain Scale: Hurts worst Pain Location: mid back with mobility Pain Intervention(s): Limited activity within patient's tolerance;Repositioned;Relaxation    Home Living                      Prior Function  PT Goals (current goals can now be found in the care plan section) Acute Rehab PT Goals Patient Stated Goal: Pt states she wants to learn how to do steps, but [facility]  PT encouraged pt to progress with transfers and gait prior to steps.  PT Goal Formulation: With patient Time For Goal Achievement: 02/24/16 Potential to Achieve Goals: Good Progress towards PT goals: Progressing toward goals    Frequency  Min 5X/week    PT Plan      Co-evaluation             End of Session Equipment Utilized During Treatment: Gait belt Activity Tolerance: Patient limited by pain Patient left: in bed;with call bell/phone within reach;with bed alarm set     Time: [id number] PT Time Calculation (min) (ACUTE ONLY): 29 min  Charges:  $Therapeutic Activity: 23-37 mins                    G  Codes:      Beth Zakariah Urwin, PT, DPT X: [phone]

## 2016-02-18 NOTE — Consult Note (Signed)
   [facility]   02/18/2016  [name] 1919/11/27 [id number]  Patient screened for potential [facility] Care Management services. Patient is eligible for [facility]. Electronic medical record reveals patient's discharge plan is for short term rehab and then return to Assisted Living Facility to reside, there were no identifiable [facility] care [facility] needs at this time. [facility] services not appropriate at this time. If patient's post hospital needs change please place a [facility] consult.  For questions please contact:   Alben SpittleMary E. Albertha GheeNiemczura, RN, BSN, [facility]  Hospital Liaison [facility] [phone]) Business Cell  [phone]) Toll Free Office

## 2016-02-18 NOTE — Clinical Social Work Placement (Signed)
   CLINICAL SOCIAL WORK PLACEMENT  NOTE  Date:  02/18/2016  Patient Details  Name: [name] MRN: [id number] Date of Birth: 04/01/1920  Clinical Social Work is seeking post-discharge placement for this patient at the Skilled  Nursing Facility level of care (*CSW will initial, date and re-position this form in  chart as items are completed):  Yes   Patient/family provided with [facility] Clinical Social Work Department's list of facilities offering this level of care within the geographic area requested by the patient (or if unable, by the patient's family).  Yes   Patient/family informed of their freedom to choose among providers that offer the needed level of care, that participate in Medicare, Medicaid or managed care program needed by the patient, have an available bed and are willing to accept the patient.  Yes   Patient/family informed of [facility]'s ownership interest in [facility] and [facility], as well as of the fact that they are under no obligation to receive care at these facilities.  PASRR submitted to EDS on       PASRR number received on       Existing PASRR number confirmed on 02/18/16     FL2 transmitted to all facilities in geographic area requested by pt/family on 02/18/16     FL2 transmitted to all facilities within larger geographic area on       Patient informed that his/her managed care company has contracts with or will negotiate with certain facilities, including the following:        Yes   Patient/family informed of bed offers received.  [facility]     Physician recommends and patient chooses bed at      [facility]  .  Patient to be transferred to facility by       Patient family notified on   of transfer.  Name of family member notified:        PHYSICIAN       Additional Comment:    _______________________________________________ Karn CassisStultz, Jaedon Siler  [place], LCSW 02/18/2016, 12:49 PM [phone]

## 2016-02-18 NOTE — Care Management Note (Signed)
Case Management Note  Patient Details  Name: Hedda Sladenn P Illescas MRN: [id number] Date of Birth: 08/01/1920  Subjective/Objective:                  [place] [facility]. Pt with AMS secondary to UTI and pelvic fracture. PT has recommended SNF and CSW is working with pt/family to make arrangements.   Action/Plan: Pt will DC to SNF. No CM needs at this time.   Expected Discharge Date:  02/19/16               Expected Discharge Plan:  Skilled Nursing Facility  In-House Referral:  Clinical Social Work  Discharge planning Services  CM Consult  Post Acute Care Choice:  NA Choice offered to:  NA  DME Arranged:    DME Agency:     HH Arranged:    HH Agency:     Status of Service:  Completed, signed off  If discussed at [employer] Meetings, dates discussed:    Additional Comments:  Malcolm MetroChildress, Kaiden Dardis Demske, RN 02/18/2016, 1:25 PM

## 2016-02-19 ENCOUNTER — Inpatient Hospital Stay
Admission: RE | Admit: 2016-02-19 | Discharge: 2016-03-23 | Disposition: A | Discharge status: Home / Self Care | Payer: Medicare Other | Source: Ambulatory Visit | Attending: Internal Medicine | Admitting: Internal Medicine

## 2016-02-19 DIAGNOSIS — R410 Disorientation, unspecified: Secondary | ICD-10-CM | POA: Diagnosis not present

## 2016-02-19 DIAGNOSIS — B9620 Unspecified Escherichia coli [E. coli] as the cause of diseases classified elsewhere: Secondary | ICD-10-CM | POA: Diagnosis not present

## 2016-02-19 DIAGNOSIS — S32502D Unspecified fracture of left pubis, subsequent encounter for fracture with routine healing: Secondary | ICD-10-CM | POA: Diagnosis not present

## 2016-02-19 DIAGNOSIS — K219 Gastro-esophageal reflux disease without esophagitis: Secondary | ICD-10-CM | POA: Diagnosis not present

## 2016-02-19 DIAGNOSIS — S32509S Unspecified fracture of unspecified pubis, sequela: Secondary | ICD-10-CM | POA: Diagnosis not present

## 2016-02-19 DIAGNOSIS — M6281 Muscle weakness (generalized): Secondary | ICD-10-CM | POA: Diagnosis not present

## 2016-02-19 DIAGNOSIS — F0390 Unspecified dementia without behavioral disturbance: Secondary | ICD-10-CM | POA: Diagnosis not present

## 2016-02-19 DIAGNOSIS — Z4789 Encounter for other orthopedic aftercare: Secondary | ICD-10-CM | POA: Diagnosis not present

## 2016-02-19 DIAGNOSIS — I10 Essential (primary) hypertension: Secondary | ICD-10-CM

## 2016-02-19 DIAGNOSIS — M1990 Unspecified osteoarthritis, unspecified site: Secondary | ICD-10-CM | POA: Diagnosis not present

## 2016-02-19 DIAGNOSIS — E039 Hypothyroidism, unspecified: Secondary | ICD-10-CM | POA: Diagnosis not present

## 2016-02-19 DIAGNOSIS — G9340 Encephalopathy, unspecified: Secondary | ICD-10-CM | POA: Diagnosis not present

## 2016-02-19 DIAGNOSIS — N390 Urinary tract infection, site not specified: Secondary | ICD-10-CM | POA: Diagnosis not present

## 2016-02-19 DIAGNOSIS — Z8781 Personal history of (healed) traumatic fracture: Secondary | ICD-10-CM | POA: Diagnosis not present

## 2016-02-19 DIAGNOSIS — K254 Chronic or unspecified gastric ulcer with hemorrhage: Secondary | ICD-10-CM | POA: Diagnosis not present

## 2016-02-19 DIAGNOSIS — G450 Vertebro-basilar artery syndrome: Secondary | ICD-10-CM | POA: Diagnosis not present

## 2016-02-19 DIAGNOSIS — N183 Chronic kidney disease, stage 3 (moderate): Secondary | ICD-10-CM | POA: Diagnosis not present

## 2016-02-19 DIAGNOSIS — Z9181 History of falling: Secondary | ICD-10-CM | POA: Diagnosis not present

## 2016-02-19 DIAGNOSIS — R293 Abnormal posture: Secondary | ICD-10-CM | POA: Diagnosis not present

## 2016-02-19 DIAGNOSIS — R262 Difficulty in walking, not elsewhere classified: Secondary | ICD-10-CM | POA: Diagnosis not present

## 2016-02-19 DIAGNOSIS — E038 Other specified hypothyroidism: Secondary | ICD-10-CM | POA: Diagnosis not present

## 2016-02-19 DIAGNOSIS — S3282XD Multiple fractures of pelvis without disruption of pelvic ring, subsequent encounter for fracture with routine healing: Secondary | ICD-10-CM | POA: Diagnosis not present

## 2016-02-19 DIAGNOSIS — M810 Age-related osteoporosis without current pathological fracture: Secondary | ICD-10-CM | POA: Diagnosis not present

## 2016-02-19 LAB — URINE CULTURE

## 2016-02-19 LAB — CBC
HCT: 36.6 % (ref 36.0–46.0)
HEMOGLOBIN: 12.1 g/dL (ref 12.0–15.0)
MCH: 31.7 pg (ref 26.0–34.0)
MCHC: 33.1 g/dL (ref 30.0–36.0)
MCV: 95.8 fL (ref 78.0–100.0)
Platelets: 285 10*3/uL (ref 150–400)
RBC: 3.82 MIL/uL — ABNORMAL LOW (ref 3.87–5.11)
RDW: 12.6 % (ref 11.5–15.5)
WBC: 14.2 10*3/uL — ABNORMAL HIGH (ref 4.0–10.5)

## 2016-02-19 LAB — BASIC METABOLIC PANEL
ANION GAP: 6 (ref 5–15)
BUN: 15 mg/dL (ref 6–20)
CALCIUM: 8.5 mg/dL — AB (ref 8.9–10.3)
CO2: 23 mmol/L (ref 22–32)
CREATININE: 0.86 mg/dL (ref 0.44–1.00)
Chloride: 105 mmol/L (ref 101–111)
GFR calc Af Amer: >60 mL/min (ref >60)
GFR, EST NON AFRICAN AMERICAN: 56 mL/min — AB (ref >60)
GLUCOSE: 83 mg/dL (ref 65–99)
Potassium: 4.5 mmol/L (ref 3.5–5.1)
Sodium: 134 mmol/L — ABNORMAL LOW (ref 135–145)

## 2016-02-19 MED ORDER — HYDROCODONE-ACETAMINOPHEN 5-325 MG PO TABS: 1.0000 | ORAL_TABLET | ORAL | Status: DC | PRN

## 2016-02-19 MED ORDER — CIPROFLOXACIN HCL 250 MG PO TABS: 250.0000 mg | ORAL_TABLET | Freq: Two times a day (BID) | ORAL | Status: AC

## 2016-02-19 NOTE — Discharge Summary (Addendum)
Physician Discharge Summary  [name] ZOX:[id number] DOB: 1920/03/11 DOA: 02/16/2016  PCP: [name], MD  Admit date: 02/16/2016 Discharge date: 02/19/2016  Time spent: 45 minutes  Recommendations for Outpatient Follow-up:  -Will be discharged to SNF today for short-term rehabilitation following her pelvic fractures.  -Please use Vicodin sparingly as she has had some delirium in the hospital and Vicodin may be playing a role. Her pain responds very well to Tylenol only.  Discharge Diagnoses:  Principal Problem:   Encephalopathy acute Active Problems:   Essential hypertension   Hypothyroidism   Dementia   CKD (chronic kidney disease) stage 3, GFR 30-59 ml/min   Fracture of multiple pubic rami (HCC)   PUD (peptic ulcer disease)   Hyponatremia   Infection of urinary tract   Delirium   Discharge Condition: Stable  Filed Weights   02/16/16 1011 02/16/16 1545  Weight: 46.267 kg (102 lb) 48.3 kg (106 lb 7.7 oz)    History of present illness:  As per Dr. Sherrie Mustache on 6/27: [name] is a 82 y.o. female with medical history significant for PUD, hypertension, HCAP in October 2016, hypothyroidism, CKD, and dementia, who presents with a report of confusion. The history is being provided by the patient's son and daughter-in-law. Accordingly, the patient lost her balance and fell 4 days ago. She presented to the ED at that time and the CT scan of the head and cervical spine were nonacute and lumbar x-ray was nonacute. She was treated with Tylenol. She developed a rash a few days ago which was all over. Prednisone was prescribed by her PCP 6 days ago. She had been taking it. However, the patient started hallucinating and became lethargic. Prednisone was discontinued. She had a couple more falls at home (ALF) which resulted in a complaint of lower back pain and left-sided pelvic pain. She denies loss of consciousness. She has had some visual changes, but she denies dizziness or  blindness. No recent vertigo symptoms. She denies fever, chills, headache, chest pain, shortness of breath, diarrhea, or pain with urination.  ED Course: In the ED, she was afebrile and hemodynamically stable. CT of her head revealed no acute findings. CT of her pelvis revealed an old right femoral neck fracture with internal fixation and fractures through the left superior and inferior pubic rami. Lab data are significant for sodium of 132, BUN of 44, creatinine of 1.15, and WBC of 20. Her urinalysis revealed many bacteria and too numerous to count WBCs. She is being admitted for further evaluation and management.  Hospital Course:   Acute encephalopathy -Resolved, patient's family agrees that she is currently at baseline. -Over the past 12 hours has developed some delirium, likely hospital delirium on top of baseline dementia. Use Vicodin sparingly. -Encephalopathy likely related to UTI, vicodin and sundowning in a patient with baseline dementia.  Left pelvic fractures -Pain is moderately well controlled on oral medications, seen by physical therapy with recommendations for SNF.  E Coli UTI  -continue Cipro for 2 more days on discharge.  Chronic kidney disease stage II to 3 -Creatinine remains at baseline around 1.1.  Dementia -At baseline, continue Namenda and Aricept.  History of peptic ulcer disease -Continue PPI.  Procedures:  None   Consultations:  None  Discharge Instructions  Discharge Instructions    Diet - low sodium heart healthy    Complete by:  As directed      Increase activity slowly    Complete by:  As  directed             Medication List    STOP taking these medications        diphenhydrAMINE 25 MG tablet  Commonly known as:  BENADRYL     hydrocortisone cream 0.5 %     Menthol (Topical Analgesic) 10 % Liqd     methylPREDNISolone 4 MG tablet  Commonly known as:  MEDROL      TAKE these medications        Biotin 300 MCG Tabs  Take 300 mcg  by mouth daily.     ciprofloxacin 250 MG tablet  Commonly known as:  CIPRO  Take 1 tablet (250 mg total) by mouth 2 (two) times daily.     DAILY VITE Tabs  Take 1 tablet by mouth daily.     donepezil 10 MG tablet  Commonly known as:  ARICEPT  Take 10 mg by mouth daily.     escitalopram 10 MG tablet  Commonly known as:  LEXAPRO  Take 10 mg by mouth daily.     feeding supplement (ENSURE COMPLETE) Liqd  Take 237 mLs by mouth as needed (for nutritional supplementation).     ferrous sulfate 325 (65 FE) MG tablet  Take 1 tablet (325 mg total) by mouth 3 (three) times daily with meals.     HYDROcodone-acetaminophen 5-325 MG tablet  Commonly known as:  NORCO/VICODIN  Take 1 tablet by mouth every 4 (four) hours as needed for moderate pain.     levothyroxine 125 MCG tablet  Commonly known as:  SYNTHROID, LEVOTHROID  Take 125-250 mcg by mouth See admin instructions. Take 2 tablets ( total) on all days except on Sundays. Take 1 tablet ( total) on Sundays     loperamide 2 MG tablet  Commonly known as:  IMODIUM A-D  Take 2 mg by mouth 2 (two) times daily.     loratadine 10 MG tablet  Commonly known as:  CLARITIN  Take 10 mg by mouth daily.     losartan 50 MG tablet  Commonly known as:  COZAAR  Take 50 mg by mouth daily.     Lutein 6 MG Caps  Take 1 capsule by mouth daily.     MAPAP 325 MG tablet  Generic drug:  acetaminophen  Take 650 mg by mouth every 4 (four) hours as needed for mild pain or moderate pain.     meclizine 25 MG tablet  Commonly known as:  ANTIVERT  Take 25 mg by mouth 3 (three) times daily as needed for dizziness.     memantine 10 MG tablet  Commonly known as:  NAMENDA  Take 10 mg by mouth 2 (two) times daily.     omeprazole 20 MG capsule  Commonly known as:  PRILOSEC  Take 20 mg by mouth daily.     oyster calcium 500 MG Tabs tablet  Take 500 mg of elemental calcium by mouth daily.     psyllium 28 % packet  Commonly known as:  METAMUCIL  SMOOTH TEXTURE  Take 1 packet by mouth daily. 3g given daily. *May give additional up to three times daily as needed for constipation     SYSTANE OP  Apply 1 drop to eye 4 (four) times daily.       Allergies  Allergen Reactions  . Amoxicillin-Pot Clavulanate     REACTION: UNKNOWN REACTION  . Cephalexin     REACTION: UNKNOWN REACTION  . Nsaids     UNKNOWN REACTION  Follow-up Information    Follow up with Colette RibasGOLDING, JOHN CABOT, MD. Schedule an appointment as soon as possible for a visit in 2 weeks.   Specialty:  Family Medicine   Contact information:   [address] [place] [address] [phone]        The results of significant diagnostics from this hospitalization (including imaging, microbiology, ancillary and laboratory) are listed below for reference.    Significant Diagnostic Studies: Dg Lumbar Spine Complete  02/13/2016  CLINICAL DATA:  82 year old female with fall and trauma to the back EXAM: LUMBAR SPINE - COMPLETE 4+ VIEW COMPARISON:  Lumbar spine radiograph dated 06/20/2015 FINDINGS: There is advanced osteopenia which limits evaluation for fracture. No definite acute fracture or subluxation identified. There is stable appearing compression deformity of the superior endplate of the L1 vertebrae. There multilevel degenerative changes with multilevel disc desiccation most prominent at L4-L5. There is stable grade 1 L4-5 anterolisthesis. The soft tissues are grossly unremarkable. IMPRESSION: No definite acute fracture or subluxation. Osteopenia with multilevel degenerative changes. Stable appearing L1 superior endplate compression deformity. Electronically Signed   By: Elgie CollardArash  Radparvar M.D.   On: 02/13/2016 00:16   Dg Pelvis 1-2 Views  02/17/2016  CLINICAL DATA:  Pelvic fracture. EXAM: PELVIS - 1-2 VIEW COMPARISON:  CT scan of February 16, 2016. FINDINGS: Status post surgical internal fixation of old right proximal femoral fracture. Nondisplaced fracture involving  the ischial tuberosity is noted. Minimally displaced left superior ramus fracture is also noted. IMPRESSION: Minimally displaced left superior ramus fracture. Nondisplaced left ischial tuberosity fracture. Electronically Signed   By: Lupita RaiderJames  Green Jr, M.D.   On: 02/17/2016 16:03   Ct Head Wo Contrast  02/16/2016  CLINICAL DATA:  Altered mental status. Fall 2 days ago. Initial encounter. EXAM: CT HEAD WITHOUT CONTRAST TECHNIQUE: Contiguous axial images were obtained from the base of the skull through the vertex without intravenous contrast. COMPARISON:  02/12/2016 FINDINGS: Brain: No evidence of acute infarction, hemorrhage, hydrocephalus, or mass lesion/mass effect. Moderate generalized atrophy with ventriculomegaly. Age congruent, mild periventricular chronic microvascular ischemic change. Vascular: No hyperdense vessel or unexpected calcification. Skull: Negative for fracture or focal lesion. Sinuses/Orbits: Bilateral cataract resection. No acute or posttraumatic finding. Other: None. IMPRESSION: 1. No acute finding or change from prior. 2. Moderate atrophy. Electronically Signed   By: Marnee SpringJonathon  Watts M.D.   On: 02/16/2016 12:23   Ct Head Wo Contrast  02/12/2016  CLINICAL DATA:  Fall backwards landing on the floor EXAM: CT HEAD WITHOUT CONTRAST CT CERVICAL SPINE WITHOUT CONTRAST TECHNIQUE: Multidetector CT imaging of the head and cervical spine was performed following the standard protocol without intravenous contrast. Multiplanar CT image reconstructions of the cervical spine were also generated. COMPARISON:  06/20/2015 FINDINGS: CT HEAD FINDINGS No skull fracture is noted. Paranasal sinuses and mastoid air cells are unremarkable. Mild atherosclerotic calcifications of carotid siphon. Moderate cerebral atrophy again noted. Stable mild periventricular chronic white matter disease. No acute cortical infarction. No mass lesion is noted on this unenhanced scan. Ventricular size is stable from prior exam. CT  CERVICAL SPINE FINDINGS Axial images of the cervical spine shows no acute fracture or subluxation. Computer processed images shows no acute fracture or subluxation. Again noted degenerative changes C1-C2 articulation. Again noted disc space flattening with mild posterior spurring at C3-C4 level. Stable disc space flattening with mild posterior spurring at C4-C5 level. There is disc space flattening with mild anterior and mild posterior spurring at C5-C6 level. Stable minimal anterolisthesis about 1.5 mm C5 on  C6 vertebral body. Again noted disc space flattening with mild anterior spurring and mild posterior spurring at C6-C7 level. Stable mild about 2 mm anterolisthesis C6 on C7 vertebral body. No prevertebral soft tissue swelling. Cervical airway is patent. There is no pneumothorax in visualized lung apices. IMPRESSION: 1. No acute intracranial abnormality. Stable atrophy and chronic white matter disease. 2. No cervical spine acute fracture or subluxation. Stable degenerative changes as described above. Electronically Signed   By: Natasha Mead M.D.   On: 02/12/2016 23:16   Ct Cervical Spine Wo Contrast  02/12/2016  CLINICAL DATA:  Fall backwards landing on the floor EXAM: CT HEAD WITHOUT CONTRAST CT CERVICAL SPINE WITHOUT CONTRAST TECHNIQUE: Multidetector CT imaging of the head and cervical spine was performed following the standard protocol without intravenous contrast. Multiplanar CT image reconstructions of the cervical spine were also generated. COMPARISON:  06/20/2015 FINDINGS: CT HEAD FINDINGS No skull fracture is noted. Paranasal sinuses and mastoid air cells are unremarkable. Mild atherosclerotic calcifications of carotid siphon. Moderate cerebral atrophy again noted. Stable mild periventricular chronic white matter disease. No acute cortical infarction. No mass lesion is noted on this unenhanced scan. Ventricular size is stable from prior exam. CT CERVICAL SPINE FINDINGS Axial images of the cervical spine  shows no acute fracture or subluxation. Computer processed images shows no acute fracture or subluxation. Again noted degenerative changes C1-C2 articulation. Again noted disc space flattening with mild posterior spurring at C3-C4 level. Stable disc space flattening with mild posterior spurring at C4-C5 level. There is disc space flattening with mild anterior and mild posterior spurring at C5-C6 level. Stable minimal anterolisthesis about 1.5 mm C5 on C6 vertebral body. Again noted disc space flattening with mild anterior spurring and mild posterior spurring at C6-C7 level. Stable mild about 2 mm anterolisthesis C6 on C7 vertebral body. No prevertebral soft tissue swelling. Cervical airway is patent. There is no pneumothorax in visualized lung apices. IMPRESSION: 1. No acute intracranial abnormality. Stable atrophy and chronic white matter disease. 2. No cervical spine acute fracture or subluxation. Stable degenerative changes as described above. Electronically Signed   By: Natasha Mead M.D.   On: 02/12/2016 23:16   Ct Pelvis Wo Contrast  02/16/2016  CLINICAL DATA:  Fall 2 days ago, landing on buttocks. Low back pain. EXAM: CT PELVIS WITHOUT CONTRAST TECHNIQUE: Multidetector CT imaging of the pelvis was performed following the standard protocol without intravenous contrast. COMPARISON:  08/11/2014 FINDINGS: Old right femoral neck fracture with changes of internal fixation. Fractures through the left superior and inferior pubic ramus. No acute femoral neck fracture. No subluxation or dislocation. Pelvic soft tissues are unremarkable. Large stool burden in the rectum. IMPRESSION: Fractures through the left superior and inferior pubic rami. Electronically Signed   By: Charlett Nose M.D.   On: 02/16/2016 12:25    Microbiology: Recent Results (from the past 240 hour(s))  Urine culture     Status: Abnormal   Collection Time: 02/16/16 10:30 AM  Result Value Ref Range Status   Specimen Description URINE,  CATHETERIZED  Final   Special Requests NONE  Final   Culture >=100,000 COLONIES/mL ESCHERICHIA COLI (A)  Final   Report Status 02/19/2016 FINAL  Final   Organism ID, Bacteria ESCHERICHIA COLI (A)  Final      Susceptibility   Escherichia coli - MIC*    AMPICILLIN <=2 SENSITIVE Sensitive     CEFAZOLIN <=4 SENSITIVE Sensitive     CEFTRIAXONE <=1 SENSITIVE Sensitive     CIPROFLOXACIN <=0.25 SENSITIVE  Sensitive     GENTAMICIN <=1 SENSITIVE Sensitive     IMIPENEM <=0.25 SENSITIVE Sensitive     NITROFURANTOIN <=16 SENSITIVE Sensitive     TRIMETH/SULFA <=20 SENSITIVE Sensitive     AMPICILLIN/SULBACTAM <=2 SENSITIVE Sensitive     PIP/TAZO <=4 SENSITIVE Sensitive     * >=100,000 COLONIES/mL ESCHERICHIA COLI  MRSA PCR Screening     Status: None   Collection Time: 02/16/16  5:00 PM  Result Value Ref Range Status   MRSA by PCR NEGATIVE NEGATIVE Final    Comment:        The GeneXpert MRSA Assay (FDA approved for NASAL specimens only), is one component of a comprehensive MRSA colonization surveillance program. It is not intended to diagnose MRSA infection nor to guide or monitor treatment for MRSA infections.      Labs: Basic Metabolic Panel:  Recent Labs Lab 02/16/16 1118 02/17/16 0607 02/18/16 0624 02/19/16 0637  NA 132* 134* 134* 134*  K 3.9 3.9 4.1 4.5  CL 97* 104 104 105  CO2 27 25 24 23   GLUCOSE 107* 91 86 83  BUN 44* 31* 22* 15  CREATININE 1.15* 1.04* 0.93 0.86  CALCIUM 8.6* 8.2* 8.1* 8.5*   Liver Function Tests:  Recent Labs Lab 02/16/16 1118  AST 19  ALT 16  ALKPHOS 66  BILITOT 0.8  PROT 6.8  ALBUMIN 3.9   No results for input(s): LIPASE, AMYLASE in the last 168 hours. No results for input(s): AMMONIA in the last 168 hours. CBC:  Recent Labs Lab 02/16/16 1118 02/17/16 0607 02/18/16 0624 02/19/16 0637  WBC 20.0* 14.7* 14.2* 14.2*  NEUTROABS 16.2*  --   --   --   HGB 13.7 12.6 12.0 12.1  HCT 40.4 37.1 35.8* 36.6  MCV 94.0 95.1 95.5 95.8  PLT 278  267 248 285   Cardiac Enzymes: No results for input(s): CKTOTAL, CKMB, CKMBINDEX, TROPONINI in the last 168 hours. BNP: BNP (last 3 results)  Recent Labs  06/06/15 2023  BNP 288.0*    ProBNP (last 3 results) No results for input(s): PROBNP in the last 8760 hours.  CBG: No results for input(s): GLUCAP in the last 168 hours.     SignedChaya Jan:  HERNANDEZ ACOSTA,ESTELA  [facility] Pager: [phone] 02/19/2016, 11:46 AM

## 2016-02-19 NOTE — Progress Notes (Signed)
[facility].  Pt stable at time of discharge.

## 2016-02-19 NOTE — Clinical Social Work Placement (Signed)
   CLINICAL SOCIAL WORK PLACEMENT  NOTE  Date:  02/19/2016  Patient Details  Name: [name] P Amaral MRN: [id number] Date of Birth: 1919/11/28  Clinical Social Work is seeking post-discharge placement for this patient at the Skilled  Nursing Facility level of care (*CSW will initial, date and re-position this form in  chart as items are completed):  Yes   Patient/family provided with [facility] Clinical Social Work Department's list of facilities offering this level of care within the geographic area requested by the patient (or if unable, by the patient's family).  Yes   Patient/family informed of their freedom to choose among providers that offer the needed level of care, that participate in Medicare, Medicaid or managed care program needed by the patient, have an available bed and are willing to accept the patient.  Yes   Patient/family informed of [facility]'s ownership interest in [facility] and [facility], as well as of the fact that they are under no obligation to receive care at these facilities.  PASRR submitted to EDS on       PASRR number received on       Existing PASRR number confirmed on 02/18/16     FL2 transmitted to all facilities in geographic area requested by pt/family on 02/18/16     FL2 transmitted to all facilities within larger geographic area on       Patient informed that his/her managed care company has contracts with or will negotiate with certain facilities, including the following:        Yes   Patient/family informed of bed offers received.  [facility]     Physician recommends and patient chooses bed at      [facility] on 02/19/16.  Patient to be transferred to facility by staff     Patient family notified on 02/19/16 of transfer.  Name of family member notified:  [name]- son     PHYSICIAN       Additional Comment:     _______________________________________________ Karn CassisStultz, Annarose Ouellet [place], LCSW 02/19/2016, 12:13 PM [phone]

## 2016-02-19 NOTE — Progress Notes (Signed)
Late entry:  Report called to Renee at [facility].  IV antibiotic infusing and notified patient would transfer after antibiotic complete.

## 2016-02-22 ENCOUNTER — Encounter (HOSPITAL_COMMUNITY)
Admission: RE | Admit: 2016-02-22 | Discharge: 2016-02-22 | Disposition: A | Discharge status: Home / Self Care | Payer: Medicare Other | Source: Skilled Nursing Facility | Attending: Internal Medicine | Admitting: Internal Medicine

## 2016-02-22 DIAGNOSIS — D508 Other iron deficiency anemias: Secondary | ICD-10-CM | POA: Insufficient documentation

## 2016-02-22 DIAGNOSIS — G9340 Encephalopathy, unspecified: Secondary | ICD-10-CM | POA: Insufficient documentation

## 2016-02-22 DIAGNOSIS — K254 Chronic or unspecified gastric ulcer with hemorrhage: Secondary | ICD-10-CM | POA: Insufficient documentation

## 2016-02-22 DIAGNOSIS — B9620 Unspecified Escherichia coli [E. coli] as the cause of diseases classified elsewhere: Secondary | ICD-10-CM | POA: Insufficient documentation

## 2016-02-22 DIAGNOSIS — I10 Essential (primary) hypertension: Secondary | ICD-10-CM | POA: Insufficient documentation

## 2016-02-22 DIAGNOSIS — E039 Hypothyroidism, unspecified: Secondary | ICD-10-CM | POA: Insufficient documentation

## 2016-02-22 DIAGNOSIS — F0390 Unspecified dementia without behavioral disturbance: Secondary | ICD-10-CM | POA: Insufficient documentation

## 2016-02-22 DIAGNOSIS — K219 Gastro-esophageal reflux disease without esophagitis: Secondary | ICD-10-CM | POA: Insufficient documentation

## 2016-02-22 LAB — CBC WITH DIFFERENTIAL/PLATELET
BASOS ABS: 0.1 10*3/uL (ref 0.0–0.1)
BASOS PCT: 1 %
EOS ABS: 0.5 10*3/uL (ref 0.0–0.7)
Eosinophils Relative: 4 %
HCT: 36.2 % (ref 36.0–46.0)
Hemoglobin: 11.7 g/dL — ABNORMAL LOW (ref 12.0–15.0)
Lymphocytes Relative: 13 %
Lymphs Abs: 1.6 10*3/uL (ref 0.7–4.0)
MCH: 31.2 pg (ref 26.0–34.0)
MCHC: 32.3 g/dL (ref 30.0–36.0)
MCV: 96.5 fL (ref 78.0–100.0)
MONO ABS: 1.4 10*3/uL — AB (ref 0.1–1.0)
MONOS PCT: 11 %
Neutro Abs: 9.1 10*3/uL — ABNORMAL HIGH (ref 1.7–7.7)
Neutrophils Relative %: 71 %
PLATELETS: 370 10*3/uL (ref 150–400)
RBC: 3.75 MIL/uL — AB (ref 3.87–5.11)
RDW: 12.8 % (ref 11.5–15.5)
WBC: 12.7 10*3/uL — ABNORMAL HIGH (ref 4.0–10.5)

## 2016-02-22 LAB — BASIC METABOLIC PANEL
Anion gap: 7 (ref 5–15)
BUN: 20 mg/dL (ref 6–20)
CALCIUM: 8.3 mg/dL — AB (ref 8.9–10.3)
CO2: 25 mmol/L (ref 22–32)
CREATININE: 1.07 mg/dL — AB (ref 0.44–1.00)
Chloride: 103 mmol/L (ref 101–111)
GFR calc Af Amer: 50 mL/min — ABNORMAL LOW (ref >60)
GFR calc non Af Amer: 43 mL/min — ABNORMAL LOW (ref >60)
GLUCOSE: 94 mg/dL (ref 65–99)
Potassium: 3.6 mmol/L (ref 3.5–5.1)
Sodium: 135 mmol/L (ref 135–145)

## 2016-02-24 ENCOUNTER — Non-Acute Institutional Stay (SKILLED_NURSING_FACILITY): Payer: Medicare Other | Admitting: Internal Medicine

## 2016-02-24 ENCOUNTER — Encounter: Payer: Self-pay | Admitting: Internal Medicine

## 2016-02-24 DIAGNOSIS — F0390 Unspecified dementia without behavioral disturbance: Secondary | ICD-10-CM | POA: Diagnosis not present

## 2016-02-24 DIAGNOSIS — N390 Urinary tract infection, site not specified: Secondary | ICD-10-CM | POA: Diagnosis not present

## 2016-02-24 DIAGNOSIS — Z8781 Personal history of (healed) traumatic fracture: Secondary | ICD-10-CM | POA: Diagnosis not present

## 2016-02-24 DIAGNOSIS — E038 Other specified hypothyroidism: Secondary | ICD-10-CM | POA: Diagnosis not present

## 2016-02-24 DIAGNOSIS — S32502D Unspecified fracture of left pubis, subsequent encounter for fracture with routine healing: Secondary | ICD-10-CM

## 2016-02-24 DIAGNOSIS — G450 Vertebro-basilar artery syndrome: Secondary | ICD-10-CM

## 2016-02-24 DIAGNOSIS — S32592D Other specified fracture of left pubis, subsequent encounter for fracture with routine healing: Secondary | ICD-10-CM

## 2016-02-24 NOTE — Assessment & Plan Note (Signed)
02/16/16 TSH 0.082 Decrease Synthroid to 112 g daily Recheck TSH in 8-10 weeks.

## 2016-02-24 NOTE — Assessment & Plan Note (Signed)
PT at [facility] 

## 2016-02-24 NOTE — Assessment & Plan Note (Signed)
02/24/16 oriented 3 No change indicated

## 2016-02-24 NOTE — Assessment & Plan Note (Signed)
2013 status post correction

## 2016-02-24 NOTE — Patient Instructions (Addendum)
When you hyperextend your neck to look over your head; this compromises the blood flow through the vertebral arteries and the basilar artery the two vertebral arteries join  to form. The basilar artery provides blood flow to the cerebellum, the balance portion of your brain. Typically there is also a component of arthritis in the cervical (neck) spine which helps to compromise the blood flow to the vertebrobasilar system with the neck hyperextended. Unfortunately the only treatment would be to avoid this head and neck position. TSH (Thyroid Stimulating Hormone) normal range = 0.35- 4.50. Ideal value is 1-3. A  Value below 0.35 indicates excessive thyroid supplementation (HYPERthyroid state) . Your TSH was 0.082. Synthroid was decreased from 125 g to 112 g. TSH follow-up would be recommended in 8-10 weeks.

## 2016-02-24 NOTE — Progress Notes (Signed)
Facility Location: [facility] Room Number: 131-P   PCP: Colette RibasGOLDING, JOHN CABOT, MD [address] [place] [id number]    This is a comprehensive admission note to [facility] personally performed by Marga MelnickWilliam Hopper MD on this date less than 30 days from date of admission. Included are preadmission medical/surgical history;reconciled medication list; family history; social history and comprehensive review of systems.  Corrections and additions to the records were documented . Comprehensive physical exam was also performed. Additionally a clinical summary was entered for each active diagnosis pertinent to this admission in the Problem List to enhance continuity of care.   HPI: She was hospitalized 6/27-6/30/17 for confusion in the setting of nondisplaced fracture involving the ischial tuberosity and minimally displaced left superior ramus fracture. She had previously had internal fixation of a right proximal femoral fracture in 2013.  She has baseline dementia. Hospital course was complicated by acute encephalopathy manifested as delirium with Vicodin. Her pain responded to Tylenol adequately. CT of the head 02/16/16 revealed no acute process. History was provided by the patient's son and daughter-in-law. She had become imbalanced and fallen 02/12/16 prior to admission. She states that she was trying to  retrieve items on a shelf above her head when she became unbalanced and fell. There was no definite cardiac or neurologic prodrome otherwise. Prior to that admission she had been treated with oral prednisone for diffuse dermatitis. This was also associated with halluciantions prompting discontinuation of the prednisone. Hospital course was complicated by  UTI treated with Cipro. She was to continue this until 02/21/16. Baseline renal insufficiency was present with creatinines of 1.1. [facility]  Past medical and surgical history: She is  on namenda and Aricept for her dementia. She has a history of peptic ulcer disease and is on prophylactic PPI. She has macular degeneration. She has history of thyroid surgery. She is on Synthroid 125 g daily. Her current TSH was 0.082 on 6/27.  Social history: Verified  Family history: Negative  Comprehensive review of systems is negative except for discomfort around her waist Constitutional: No fever,significant weight change, fatigue  Eyes: No redness, discharge, pain, vision change ENT/mouth: No nasal congestion,  purulent discharge, earache,change in hearing ,sore throat  Cardiovascular: No chest pain, palpitations,paroxysmal nocturnal dyspnea, claudication, edema  Respiratory: No cough, sputum production,hemoptysis, DOE , significant snoring,apnea   Gastrointestinal: No heartburn,dysphagia,abdominal pain, nausea / vomiting,rectal bleeding, melena,change in bowels Genitourinary: No dysuria,hematuria, pyuria,  incontinence, nocturia at this time Musculoskeletal: No joint stiffness, joint swelling, weakness,pain Dermatologic: No rash, pruritus, change in appearance of skin Neurologic: No dizziness,headache,syncope, seizures, numbness , tingling beyond that described in the history of present illness. Psychiatric: No significant anxiety , depression, insomnia, anorexia Endocrine: No change in hair/skin/ nails, excessive thirst, excessive hunger, excessive urination  Hematologic/lymphatic: No significant bruising, lymphadenopathy,abnormal bleeding Allergy/immunology: No itchy/ watery eyes, significant sneezing, urticaria, angioedema  Physical exam:  Pertinent or positive findings: She is alert and oriented. She did miss her son's year of birth by 1 year. Otherwise she was oriented 3. Pupils are pinpoint. Arcus senilis is present. She has a grade 1 systolic murmur. Carotid bruits were not auscultated. Breath sounds are decreased. Thyroid is small.  She has mixed DIP and PIP joint  changes new There is no pain to light percussion over the lumbosacral spine. Posterior tibial pulses are decreased. General appearance:Adequately nourished; no acute distress , increased work of breathing is present.   Lymphatic: No lymphadenopathy  about the head, neck, axilla . Eyes: No conjunctival inflammation or lid edema is present. There is no scleral icterus. Ears:  External ear exam shows no significant lesions or deformities.   Nose:  External nasal examination shows no deformity or inflammation. Nasal mucosa are pink and moist without lesions ,exudates Oral exam: lips and gums are healthy appearing.There is no oropharyngeal erythema or exudate . Neck:  No thyromegaly, masses, tenderness noted.    Heart:  Normal rate and regular rhythm. S1 and S2 normal without gallop,click, rub .  Lungs:Chest clear to auscultation without wheezes, rhonchi,rales , rubs. Abdomen:Bowel sounds are normal. Abdomen is soft and nontender with no organomegaly, hernias,masses. GU: deferred . Extremities:  No cyanosis, clubbing,edema  Neurologic exam : Strength equal  in upper & lower extremities but decreased Balance,Rhomberg,finger to nose testing could not be completed due to clinical state Deep tendon reflexes are equal Skin: Warm & dry w/o tenting. No significant lesions or rash.  See clinical summary under each active problem in the Problem List with associated updated therapeutic plan

## 2016-02-26 ENCOUNTER — Encounter (HOSPITAL_COMMUNITY)
Admission: AD | Admit: 2016-02-26 | Discharge: 2016-02-26 | Disposition: A | Discharge status: Home / Self Care | Payer: Medicare Other | Source: Skilled Nursing Facility | Attending: *Deleted | Admitting: *Deleted

## 2016-02-26 LAB — CBC WITH DIFFERENTIAL/PLATELET
Basophils Absolute: 0 10*3/uL (ref 0.0–0.1)
Basophils Relative: 0 %
Eosinophils Absolute: 0.5 10*3/uL (ref 0.0–0.7)
Eosinophils Relative: 5 %
HEMATOCRIT: 34.1 % — AB (ref 36.0–46.0)
HEMOGLOBIN: 11.2 g/dL — AB (ref 12.0–15.0)
LYMPHS ABS: 1.4 10*3/uL (ref 0.7–4.0)
LYMPHS PCT: 13 %
MCH: 31.4 pg (ref 26.0–34.0)
MCHC: 32.8 g/dL (ref 30.0–36.0)
MCV: 95.5 fL (ref 78.0–100.0)
MONOS PCT: 10 %
Monocytes Absolute: 1.1 10*3/uL — ABNORMAL HIGH (ref 0.1–1.0)
NEUTROS PCT: 72 %
Neutro Abs: 7.5 10*3/uL (ref 1.7–7.7)
Platelets: 362 10*3/uL (ref 150–400)
RBC: 3.57 MIL/uL — AB (ref 3.87–5.11)
RDW: 12.6 % (ref 11.5–15.5)
WBC: 10.4 10*3/uL (ref 4.0–10.5)

## 2016-02-26 LAB — BASIC METABOLIC PANEL
Anion gap: 7 (ref 5–15)
BUN: 29 mg/dL — AB (ref 6–20)
CHLORIDE: 101 mmol/L (ref 101–111)
CO2: 27 mmol/L (ref 22–32)
CREATININE: 1.14 mg/dL — AB (ref 0.44–1.00)
Calcium: 8.6 mg/dL — ABNORMAL LOW (ref 8.9–10.3)
GFR calc Af Amer: 46 mL/min — ABNORMAL LOW (ref >60)
GFR calc non Af Amer: 40 mL/min — ABNORMAL LOW (ref >60)
GLUCOSE: 92 mg/dL (ref 65–99)
POTASSIUM: 4.2 mmol/L (ref 3.5–5.1)
Sodium: 135 mmol/L (ref 135–145)

## 2016-03-01 ENCOUNTER — Encounter (HOSPITAL_COMMUNITY)
Admission: RE | Admit: 2016-03-01 | Discharge: 2016-03-01 | Disposition: A | Discharge status: Home / Self Care | Payer: Medicare Other | Source: Skilled Nursing Facility | Attending: Internal Medicine | Admitting: Internal Medicine

## 2016-03-01 LAB — BASIC METABOLIC PANEL
Anion gap: 4 — ABNORMAL LOW (ref 5–15)
BUN: 31 mg/dL — AB (ref 6–20)
CALCIUM: 8.4 mg/dL — AB (ref 8.9–10.3)
CO2: 28 mmol/L (ref 22–32)
CREATININE: 1.22 mg/dL — AB (ref 0.44–1.00)
Chloride: 102 mmol/L (ref 101–111)
GFR calc Af Amer: 43 mL/min — ABNORMAL LOW (ref >60)
GFR, EST NON AFRICAN AMERICAN: 37 mL/min — AB (ref >60)
GLUCOSE: 92 mg/dL (ref 65–99)
Potassium: 4.3 mmol/L (ref 3.5–5.1)
Sodium: 134 mmol/L — ABNORMAL LOW (ref 135–145)

## 2016-03-01 LAB — CBC WITH DIFFERENTIAL/PLATELET
Basophils Absolute: 0 10*3/uL (ref 0.0–0.1)
Basophils Relative: 0 %
EOS ABS: 0.7 10*3/uL (ref 0.0–0.7)
EOS PCT: 8 %
HCT: 32.8 % — ABNORMAL LOW (ref 36.0–46.0)
Hemoglobin: 10.7 g/dL — ABNORMAL LOW (ref 12.0–15.0)
LYMPHS ABS: 1.5 10*3/uL (ref 0.7–4.0)
LYMPHS PCT: 17 %
MCH: 31.4 pg (ref 26.0–34.0)
MCHC: 32.6 g/dL (ref 30.0–36.0)
MCV: 96.2 fL (ref 78.0–100.0)
MONO ABS: 0.9 10*3/uL (ref 0.1–1.0)
Monocytes Relative: 10 %
Neutro Abs: 5.8 10*3/uL (ref 1.7–7.7)
Neutrophils Relative %: 65 %
PLATELETS: 299 10*3/uL (ref 150–400)
RBC: 3.41 MIL/uL — AB (ref 3.87–5.11)
RDW: 12.7 % (ref 11.5–15.5)
WBC: 8.9 10*3/uL (ref 4.0–10.5)

## 2016-03-01 LAB — HEPATIC FUNCTION PANEL
ALT: 12 U/L — ABNORMAL LOW (ref 14–54)
AST: 16 U/L (ref 15–41)
Albumin: 3.2 g/dL — ABNORMAL LOW (ref 3.5–5.0)
Alkaline Phosphatase: 106 U/L (ref 38–126)
Total Bilirubin: 0.8 mg/dL (ref 0.3–1.2)
Total Protein: 5.7 g/dL — ABNORMAL LOW (ref 6.5–8.1)

## 2016-03-01 LAB — TSH: TSH: 1.857 u[IU]/mL (ref 0.350–4.500)

## 2016-03-01 LAB — FERRITIN: FERRITIN: 1345 ng/mL — AB (ref 11–307)

## 2016-03-02 ENCOUNTER — Encounter (HOSPITAL_COMMUNITY)
Admission: RE | Admit: 2016-03-02 | Discharge: 2016-03-02 | Disposition: A | Discharge status: Home / Self Care | Payer: Medicare Other | Source: Skilled Nursing Facility | Attending: *Deleted | Admitting: *Deleted

## 2016-03-02 LAB — FOLATE: Folate: 21.9 ng/mL (ref >5.9)

## 2016-03-02 LAB — VITAMIN B12: VITAMIN B 12: 542 pg/mL (ref 180–914)

## 2016-03-02 LAB — FERRITIN: FERRITIN: 1353 ng/mL — AB (ref 11–307)

## 2016-03-04 ENCOUNTER — Other Ambulatory Visit: Payer: Self-pay | Admitting: *Deleted

## 2016-03-04 MED ORDER — HYDROCODONE-ACETAMINOPHEN 5-325 MG PO TABS: 1.0000 | ORAL_TABLET | ORAL | Status: DC | PRN

## 2016-03-04 NOTE — Telephone Encounter (Signed)
[facility]  

## 2016-03-21 ENCOUNTER — Encounter: Payer: Self-pay | Admitting: Internal Medicine

## 2016-03-21 ENCOUNTER — Non-Acute Institutional Stay (SKILLED_NURSING_FACILITY): Payer: Medicare Other | Admitting: Internal Medicine

## 2016-03-21 DIAGNOSIS — F0390 Unspecified dementia without behavioral disturbance: Secondary | ICD-10-CM | POA: Diagnosis not present

## 2016-03-21 DIAGNOSIS — I10 Essential (primary) hypertension: Secondary | ICD-10-CM | POA: Diagnosis not present

## 2016-03-21 DIAGNOSIS — E038 Other specified hypothyroidism: Secondary | ICD-10-CM | POA: Diagnosis not present

## 2016-03-21 DIAGNOSIS — S32509S Unspecified fracture of unspecified pubis, sequela: Secondary | ICD-10-CM

## 2016-03-21 DIAGNOSIS — S32599S Other specified fracture of unspecified pubis, sequela: Secondary | ICD-10-CM

## 2016-03-21 NOTE — Progress Notes (Signed)
Location:   [facility] Room Number: 131/P Place of Service:  SNF [id number])  Provider: Edmon Crape  PCP: [name], MD Patient Care Team: Assunta Found, MD as PCP - General [facility])  Extended Emergency Contact Information Primary Emergency Contact: Karren Cobble Address: [address]          [place], [place] [address] [place] of [place] Home Phone: [phone] Mobile Phone: [phone] Relation: Son Secondary Emergency Contact: [place], [place] [address] [place] of [place] Home Phone: [phone] Mobile Phone: [phone] Relation: Relative  Code Status: DNR Goals of care:  Advanced Directive information Advanced Directives 03/21/2016  Does patient have an advance directive? Yes  Type of Estate agent of [place];Out of facility DNR (pink MOST or yellow form)  Does patient want to make changes to advanced directive? No - Patient declined  Copy of advanced directive(s) in chart? Yes  Would patient like information on creating an advanced directive? -  Pre-existing out of facility DNR order (yellow form or pink MOST form) -     Allergies  Allergen Reactions  . Amoxicillin-Pot Clavulanate     REACTION: UNKNOWN REACTION  . Cephalexin     REACTION: UNKNOWN REACTION  . Nsaids     UNKNOWN REACTION    Chief Complaint  Patient presents with  . Discharge Note    HPI:  82 y.o. female  seen today for discharge. She has been in skilled nursing for rehabilitation after a fall and sustained a nondisplaced fracture involving  The pelvis  She does have some dementia hospital course was complicated by acute encephalopathy with delirium this was thought secondary to Vicodin however she has tolerated Vicodin well here.  She was also treated for a UTI with Cipro during her hospitalization.  Her course here is been quite unremarkable her pain appears to be under better control again she is  tolerating the Vicodin well.  She continues on Namenda and Aricept for dementia.  She is on prophylactic PPI for peptic ulcer disease.  She also has a history of thyroid surgery she is on Synthroid 112 g daily this was reduced slightly secondary to a TSH of 0.082 this will need follow-up in approximately 5 weeks.  Currently she has no complaints she is looking very much into going home which is an assisted living facility in the community-she will need continued PT and OT for strengthening.        Past Medical History:  Diagnosis Date  . Anemia   . CKD (chronic kidney disease) stage 3, GFR 30-59 ml/min 06/07/2015  . Dementia   . Escherichia coli urinary tract infection 02/16/16   Cipro  . Gastric ulcer    gi bleed secondary  . HCAP (healthcare-associated pneumonia) 06/06/2015  . History of fracture of right hip 05/21/2012   ORIF 05/13/12   . History of subdural hematoma (post traumatic) 05/21/2012   Dx on MR I in September 2013   . Hypertension   . Hypothyroidism   . Macular degeneration   . Vertigo     Past Surgical History:  Procedure Laterality Date  . ABDOMINAL HYSTERECTOMY    . CARPAL TUNNEL RELEASE    . FEMUR IM NAIL  05/13/2012   Procedure: INTRAMEDULLARY (IM) NAIL FEMORAL;  Surgeon: Senaida Lange, MD;  Location: [facility] OR;  Service: Orthopedics;  Laterality: Right;  . FRACTURE SURGERY    . MASTECTOMY    .  THYROID SURGERY        reports that she has never smoked. She has never used smokeless tobacco. She reports that she does not drink alcohol or use drugs. Social History   Social History  . Marital status: Widowed    Spouse name: N/A  . Number of children: N/A  . Years of education: N/A   Occupational History  . Not on file.   Social History Main Topics  . Smoking status: Never Smoker  . Smokeless tobacco: Never Used  . Alcohol use No  . Drug use: No  . Sexual activity: No   Other Topics Concern  . Not on file   Social History Narrative  . No  narrative on file   Functional Status Survey:    Allergies  Allergen Reactions  . Amoxicillin-Pot Clavulanate     REACTION: UNKNOWN REACTION  . Cephalexin     REACTION: UNKNOWN REACTION  . Nsaids     UNKNOWN REACTION    Pertinent  Health Maintenance Due  Topic Date Due  . DEXA SCAN  11/06/1986  . PNA vac Low Risk Adult (1 of 2 - PCV13) 11/06/1986  . INFLUENZA VACCINE  03/22/2016    Medications: Current Outpatient Prescriptions on File Prior to Visit  Medication Sig Dispense Refill  . acetaminophen (MAPAP) 325 MG tablet Take 650 mg by mouth every 4 (four) hours as needed for mild pain or moderate pain.    . Biotin 300 MCG TABS Take 300 mcg by mouth daily.     Marland Kitchen donepezil (ARICEPT) 10 MG tablet Take 10 mg by mouth daily.     Marland Kitchen escitalopram (LEXAPRO) 10 MG tablet Take 10 mg by mouth daily.    . ferrous sulfate 325 (65 FE) MG tablet Take 1 tablet (325 mg total) by mouth 3 (three) times daily with meals.    Marland Kitchen HYDROcodone-acetaminophen (NORCO/VICODIN) 5-325 MG tablet Take 1 tablet by mouth every 4 (four) hours as needed for moderate pain. 180 tablet 0  . levothyroxine (SYNTHROID, LEVOTHROID) 112 MCG tablet Take 1 tablet (112 mcg total) by mouth daily. 90 tablet 3  . loperamide (IMODIUM A-D) 2 MG tablet Take 2 mg by mouth 2 (two) times daily.    Marland Kitchen loratadine (CLARITIN) 10 MG tablet Take 10 mg by mouth daily.    Marland Kitchen losartan (COZAAR) 50 MG tablet Take 50 mg by mouth daily.    . Lutein 6 MG CAPS Take 1 capsule by mouth daily.    . meclizine (ANTIVERT) 25 MG tablet Take 25 mg by mouth every 8 (eight) hours as needed for dizziness.     . memantine (NAMENDA) 10 MG tablet Take 10 mg by mouth 2 (two) times daily.    . Multiple Vitamin (DAILY VITE) TABS Take 1 tablet by mouth daily.    Marland Kitchen omeprazole (PRILOSEC) 20 MG capsule Take 20 mg by mouth daily.    Ethelda Chick (OYSTER CALCIUM) 500 MG TABS tablet Take 500 mg of elemental calcium by mouth daily.    Bertram Gala Glycol-Propyl Glycol  (SYSTANE OP) Apply 1 drop to eye 4 (four) times daily.    . psyllium (METAMUCIL SMOOTH TEXTURE) 28 % packet Take 1 packet by mouth daily. Special instructions, 1 packet by mouth for constipation every 8 hours PRN     No current facility-administered medications on file prior to visit.      Review of Systems In general does not complaining any fever or chills her hip waist pain appears to be  controlled.  Skin does not complain of rashes or itching.  Head ears eyes nose mouth and throat does not complain of any visual changes or sore throat.  Respiratory is not complaining of shortness breath or cough.  Cardiac denies chest pain or significant edema.  GI is not complaining of abdominal discomfort nausea vomiting diarrhea or constipation.  Muscle skeletal her pain as noted above appears to be controlled.  Neurologic is not complaining of dizziness headache or numbness.  In psych does have a history of dementia although this appears to be mild his done well with supportive care here.   Vitals:   03/21/16 1213  BP: 113/66  Pulse: 78  Resp: 16  Temp: 98.1 F (36.7 C)  TempSrc: Oral  SpO2: 94%  Weight: 96 lb 12.8 oz (43.9 kg)  Height: 5' (1.524 m)   Body mass index is 18.9 kg/m. Physical Exam In general this is a pleasant elderly female in no distress sitting comfortably in her wheelchair.  Her skin is warm and dry.  Eyes pupils appear reactive to light she has somewhat small pupils visual acuity appears grossly intact.  Oropharynx is clear mucous membranes moist.  Chest is clear to auscultation there is no labored breathing.  Heart is regular rate and rhythm with a 2/6 systolic murmur there is no significant lower extremity edema has somewhat reduced pedal pulses bilaterally.  Abdomen is soft nontender with positive bowel sounds.  Muscle skeletal is able to move all extremities 4 I do not note any deformities other than arthritic still has some lower extremity  weakness but hasb apparently gain some strength here.  Neurologic is grossly intact no lateralizing findings her speech is clear.  Psych she is alert and oriented grossly pleasant and appropriate Labs reviewed: Basic Metabolic Panel:  Recent Labs  16/10/96 0545 02/26/16 0710 03/01/16 0714  NA 135 135 134*  K 3.6 4.2 4.3  CL 103 101 102  CO2 25 27 28   GLUCOSE 94 92 92  BUN 20 29* 31*  CREATININE 1.07* 1.14* 1.22*  CALCIUM 8.3* 8.6* 8.4*   Liver Function Tests:  Recent Labs  06/07/15 0625 02/16/16 1118 03/01/16 0714  AST 18 19 16   ALT 12* 16 12*  ALKPHOS 61 66 106  BILITOT 0.7 0.8 0.8  PROT 5.5* 6.8 5.7*  ALBUMIN 3.0* 3.9 3.2*   No results for input(s): LIPASE, AMYLASE in the last 8760 hours. No results for input(s): AMMONIA in the last 8760 hours. CBC:  Recent Labs  02/22/16 0545 02/26/16 0710 03/01/16 0714  WBC 12.7* 10.4 8.9  NEUTROABS 9.1* 7.5 5.8  HGB 11.7* 11.2* 10.7*  HCT 36.2 34.1* 32.8*  MCV 96.5 95.5 96.2  PLT 370 362 299   Cardiac Enzymes:  Recent Labs  06/06/15 2023  TROPONINI 0.05*   BNP: Invalid input(s): POCBNP CBG: No results for input(s): GLUCAP in the last 8760 hours.  Procedures and Imaging Studies During Stay: No results found.  Assessment/Plan:   History of pelvic fractures as noted above-she appears to have done well with this has worked with therapy will need continued therapy she is receiving Vicodin for pain as needed she has tolerated this well without mental status changes.  History of anemia iron deficiency she is on iron hemoglobin appears stable at 10.7 will update this before discharge.  #3 history of hypothyroidism again TSH was borderline low at 0.082 in late June Dr. Alwyn Ren did decrease her Synthroid 212 g a day update TSH should be gone in  approximately 5 weeks will defer to primary care provider for follow-up.  #4 history of hypertension this appears stable she is on Cozaar 50 mg a day-recent blood pressures  113/66-119/64-120/66-E.  #5-history of dementia she is on Namenda and Aricept dementia appears to be quite mild she has done well here with supportive care.  #6 history of depression she is on Lexapro this is not really been an issue during her stay here she appears to be bright alert and in good spirits.   #7 as history renal insufficiency most recent creatinine 1.223 weeks ago-will update this before discharge.         Patient is being discharged with the following home health services: PT and OT        Patient has been advised to f/u with their PCP in 1-2 weeks to bring them up to date on their rehab stay.  Social services at facility was responsible for arranging this appointment.  Pt was provided with a 30 day supply of prescriptions for medications and refills must be obtained from their PCP.  For controlled substances, a more limited supply may be provided adequate until PCP appointment only.  [id number]-of note greater than 30 minutes spent on this discharge summary-greater than 50% of time spent coordinating plan of care for numerous diagnoses

## 2016-03-22 ENCOUNTER — Encounter: Payer: Self-pay | Admitting: Internal Medicine

## 2016-03-22 ENCOUNTER — Encounter (HOSPITAL_COMMUNITY)
Admission: RE | Admit: 2016-03-22 | Discharge: 2016-03-22 | Disposition: A | Discharge status: Home / Self Care | Payer: Medicare Other | Source: Skilled Nursing Facility | Attending: Internal Medicine | Admitting: Internal Medicine

## 2016-03-22 LAB — BASIC METABOLIC PANEL
ANION GAP: 5 (ref 5–15)
BUN: 37 mg/dL — ABNORMAL HIGH (ref 6–20)
CHLORIDE: 103 mmol/L (ref 101–111)
CO2: 30 mmol/L (ref 22–32)
Calcium: 9 mg/dL (ref 8.9–10.3)
Creatinine, Ser: 1.3 mg/dL — ABNORMAL HIGH (ref 0.44–1.00)
GFR calc non Af Amer: 34 mL/min — ABNORMAL LOW (ref >60)
GFR, EST AFRICAN AMERICAN: 39 mL/min — AB (ref >60)
GLUCOSE: 88 mg/dL (ref 65–99)
Potassium: 4.9 mmol/L (ref 3.5–5.1)
Sodium: 138 mmol/L (ref 135–145)

## 2016-03-22 LAB — CBC
HEMATOCRIT: 34.2 % — AB (ref 36.0–46.0)
HEMOGLOBIN: 11.5 g/dL — AB (ref 12.0–15.0)
MCH: 32.7 pg (ref 26.0–34.0)
MCHC: 33.6 g/dL (ref 30.0–36.0)
MCV: 97.2 fL (ref 78.0–100.0)
Platelets: 241 10*3/uL (ref 150–400)
RBC: 3.52 MIL/uL — ABNORMAL LOW (ref 3.87–5.11)
RDW: 13.9 % (ref 11.5–15.5)
WBC: 8.6 10*3/uL (ref 4.0–10.5)

## 2016-03-22 NOTE — Progress Notes (Signed)
Opened in error

## 2016-03-23 NOTE — Progress Notes (Signed)
Discharge addressed 03/21/16 by Mr Edmon Crape, not Dr Chales Abrahams

## 2016-03-23 NOTE — Progress Notes (Signed)
Completed by Dr Donia Ast

## 2016-03-25 DIAGNOSIS — M6281 Muscle weakness (generalized): Secondary | ICD-10-CM | POA: Diagnosis not present

## 2016-03-25 DIAGNOSIS — D508 Other iron deficiency anemias: Secondary | ICD-10-CM | POA: Diagnosis not present

## 2016-03-25 DIAGNOSIS — Z9181 History of falling: Secondary | ICD-10-CM | POA: Diagnosis not present

## 2016-03-25 DIAGNOSIS — E039 Hypothyroidism, unspecified: Secondary | ICD-10-CM | POA: Diagnosis not present

## 2016-03-25 DIAGNOSIS — K219 Gastro-esophageal reflux disease without esophagitis: Secondary | ICD-10-CM | POA: Diagnosis not present

## 2016-03-25 DIAGNOSIS — M150 Primary generalized (osteo)arthritis: Secondary | ICD-10-CM | POA: Diagnosis not present

## 2016-03-25 DIAGNOSIS — M25551 Pain in right hip: Secondary | ICD-10-CM | POA: Diagnosis not present

## 2016-03-25 DIAGNOSIS — Z87440 Personal history of urinary (tract) infections: Secondary | ICD-10-CM | POA: Diagnosis not present

## 2016-03-25 DIAGNOSIS — M545 Low back pain: Secondary | ICD-10-CM | POA: Diagnosis not present

## 2016-03-25 DIAGNOSIS — I129 Hypertensive chronic kidney disease with stage 1 through stage 4 chronic kidney disease, or unspecified chronic kidney disease: Secondary | ICD-10-CM | POA: Diagnosis not present

## 2016-03-25 DIAGNOSIS — Z8781 Personal history of (healed) traumatic fracture: Secondary | ICD-10-CM | POA: Diagnosis not present

## 2016-03-25 DIAGNOSIS — S3282XD Multiple fractures of pelvis without disruption of pelvic ring, subsequent encounter for fracture with routine healing: Secondary | ICD-10-CM | POA: Diagnosis not present

## 2016-03-25 DIAGNOSIS — N183 Chronic kidney disease, stage 3 (moderate): Secondary | ICD-10-CM | POA: Diagnosis not present

## 2016-03-25 DIAGNOSIS — M810 Age-related osteoporosis without current pathological fracture: Secondary | ICD-10-CM | POA: Diagnosis not present

## 2016-03-25 DIAGNOSIS — F0390 Unspecified dementia without behavioral disturbance: Secondary | ICD-10-CM | POA: Diagnosis not present

## 2016-03-28 DIAGNOSIS — F0390 Unspecified dementia without behavioral disturbance: Secondary | ICD-10-CM | POA: Diagnosis not present

## 2016-03-28 DIAGNOSIS — M1991 Primary osteoarthritis, unspecified site: Secondary | ICD-10-CM | POA: Diagnosis not present

## 2016-03-28 DIAGNOSIS — Z681 Body mass index (BMI) 19 or less, adult: Secondary | ICD-10-CM | POA: Diagnosis not present

## 2016-03-29 DIAGNOSIS — M150 Primary generalized (osteo)arthritis: Secondary | ICD-10-CM | POA: Diagnosis not present

## 2016-03-29 DIAGNOSIS — M545 Low back pain: Secondary | ICD-10-CM | POA: Diagnosis not present

## 2016-03-29 DIAGNOSIS — I129 Hypertensive chronic kidney disease with stage 1 through stage 4 chronic kidney disease, or unspecified chronic kidney disease: Secondary | ICD-10-CM | POA: Diagnosis not present

## 2016-03-29 DIAGNOSIS — M25551 Pain in right hip: Secondary | ICD-10-CM | POA: Diagnosis not present

## 2016-03-29 DIAGNOSIS — S3282XD Multiple fractures of pelvis without disruption of pelvic ring, subsequent encounter for fracture with routine healing: Secondary | ICD-10-CM | POA: Diagnosis not present

## 2016-03-29 DIAGNOSIS — M6281 Muscle weakness (generalized): Secondary | ICD-10-CM | POA: Diagnosis not present

## 2016-03-30 DIAGNOSIS — M150 Primary generalized (osteo)arthritis: Secondary | ICD-10-CM | POA: Diagnosis not present

## 2016-03-30 DIAGNOSIS — M6281 Muscle weakness (generalized): Secondary | ICD-10-CM | POA: Diagnosis not present

## 2016-03-30 DIAGNOSIS — S3282XD Multiple fractures of pelvis without disruption of pelvic ring, subsequent encounter for fracture with routine healing: Secondary | ICD-10-CM | POA: Diagnosis not present

## 2016-03-30 DIAGNOSIS — M79674 Pain in right toe(s): Secondary | ICD-10-CM | POA: Diagnosis not present

## 2016-03-30 DIAGNOSIS — B351 Tinea unguium: Secondary | ICD-10-CM | POA: Diagnosis not present

## 2016-03-30 DIAGNOSIS — M545 Low back pain: Secondary | ICD-10-CM | POA: Diagnosis not present

## 2016-03-30 DIAGNOSIS — M25551 Pain in right hip: Secondary | ICD-10-CM | POA: Diagnosis not present

## 2016-03-30 DIAGNOSIS — I129 Hypertensive chronic kidney disease with stage 1 through stage 4 chronic kidney disease, or unspecified chronic kidney disease: Secondary | ICD-10-CM | POA: Diagnosis not present

## 2016-03-30 DIAGNOSIS — M79675 Pain in left toe(s): Secondary | ICD-10-CM | POA: Diagnosis not present

## 2016-04-01 DIAGNOSIS — M25551 Pain in right hip: Secondary | ICD-10-CM | POA: Diagnosis not present

## 2016-04-01 DIAGNOSIS — M6281 Muscle weakness (generalized): Secondary | ICD-10-CM | POA: Diagnosis not present

## 2016-04-01 DIAGNOSIS — I129 Hypertensive chronic kidney disease with stage 1 through stage 4 chronic kidney disease, or unspecified chronic kidney disease: Secondary | ICD-10-CM | POA: Diagnosis not present

## 2016-04-01 DIAGNOSIS — M545 Low back pain: Secondary | ICD-10-CM | POA: Diagnosis not present

## 2016-04-01 DIAGNOSIS — M150 Primary generalized (osteo)arthritis: Secondary | ICD-10-CM | POA: Diagnosis not present

## 2016-04-01 DIAGNOSIS — S3282XD Multiple fractures of pelvis without disruption of pelvic ring, subsequent encounter for fracture with routine healing: Secondary | ICD-10-CM | POA: Diagnosis not present

## 2016-04-04 DIAGNOSIS — S3282XD Multiple fractures of pelvis without disruption of pelvic ring, subsequent encounter for fracture with routine healing: Secondary | ICD-10-CM | POA: Diagnosis not present

## 2016-04-04 DIAGNOSIS — I129 Hypertensive chronic kidney disease with stage 1 through stage 4 chronic kidney disease, or unspecified chronic kidney disease: Secondary | ICD-10-CM | POA: Diagnosis not present

## 2016-04-04 DIAGNOSIS — M545 Low back pain: Secondary | ICD-10-CM | POA: Diagnosis not present

## 2016-04-04 DIAGNOSIS — M25551 Pain in right hip: Secondary | ICD-10-CM | POA: Diagnosis not present

## 2016-04-04 DIAGNOSIS — M150 Primary generalized (osteo)arthritis: Secondary | ICD-10-CM | POA: Diagnosis not present

## 2016-04-04 DIAGNOSIS — M6281 Muscle weakness (generalized): Secondary | ICD-10-CM | POA: Diagnosis not present

## 2016-04-05 ENCOUNTER — Ambulatory Visit: Payer: PRIVATE HEALTH INSURANCE | Admitting: Endocrinology

## 2016-04-05 ENCOUNTER — Encounter (HOSPITAL_COMMUNITY)
Admission: RE | Admit: 2016-04-05 | Discharge: 2016-04-05 | Disposition: A | Discharge status: Home / Self Care | Payer: Medicare Other | Source: Skilled Nursing Facility | Attending: *Deleted | Admitting: *Deleted

## 2016-04-06 DIAGNOSIS — M25551 Pain in right hip: Secondary | ICD-10-CM | POA: Diagnosis not present

## 2016-04-06 DIAGNOSIS — I129 Hypertensive chronic kidney disease with stage 1 through stage 4 chronic kidney disease, or unspecified chronic kidney disease: Secondary | ICD-10-CM | POA: Diagnosis not present

## 2016-04-06 DIAGNOSIS — M6281 Muscle weakness (generalized): Secondary | ICD-10-CM | POA: Diagnosis not present

## 2016-04-06 DIAGNOSIS — M545 Low back pain: Secondary | ICD-10-CM | POA: Diagnosis not present

## 2016-04-06 DIAGNOSIS — M150 Primary generalized (osteo)arthritis: Secondary | ICD-10-CM | POA: Diagnosis not present

## 2016-04-06 DIAGNOSIS — S3282XD Multiple fractures of pelvis without disruption of pelvic ring, subsequent encounter for fracture with routine healing: Secondary | ICD-10-CM | POA: Diagnosis not present

## 2016-04-11 DIAGNOSIS — I129 Hypertensive chronic kidney disease with stage 1 through stage 4 chronic kidney disease, or unspecified chronic kidney disease: Secondary | ICD-10-CM | POA: Diagnosis not present

## 2016-04-11 DIAGNOSIS — M6281 Muscle weakness (generalized): Secondary | ICD-10-CM | POA: Diagnosis not present

## 2016-04-11 DIAGNOSIS — M545 Low back pain: Secondary | ICD-10-CM | POA: Diagnosis not present

## 2016-04-11 DIAGNOSIS — M150 Primary generalized (osteo)arthritis: Secondary | ICD-10-CM | POA: Diagnosis not present

## 2016-04-11 DIAGNOSIS — S3282XD Multiple fractures of pelvis without disruption of pelvic ring, subsequent encounter for fracture with routine healing: Secondary | ICD-10-CM | POA: Diagnosis not present

## 2016-04-11 DIAGNOSIS — M25551 Pain in right hip: Secondary | ICD-10-CM | POA: Diagnosis not present

## 2016-04-14 DIAGNOSIS — M545 Low back pain: Secondary | ICD-10-CM | POA: Diagnosis not present

## 2016-04-14 DIAGNOSIS — S3282XD Multiple fractures of pelvis without disruption of pelvic ring, subsequent encounter for fracture with routine healing: Secondary | ICD-10-CM | POA: Diagnosis not present

## 2016-04-14 DIAGNOSIS — M150 Primary generalized (osteo)arthritis: Secondary | ICD-10-CM | POA: Diagnosis not present

## 2016-04-14 DIAGNOSIS — I129 Hypertensive chronic kidney disease with stage 1 through stage 4 chronic kidney disease, or unspecified chronic kidney disease: Secondary | ICD-10-CM | POA: Diagnosis not present

## 2016-04-14 DIAGNOSIS — M25551 Pain in right hip: Secondary | ICD-10-CM | POA: Diagnosis not present

## 2016-04-14 DIAGNOSIS — M6281 Muscle weakness (generalized): Secondary | ICD-10-CM | POA: Diagnosis not present

## 2016-04-18 DIAGNOSIS — M6281 Muscle weakness (generalized): Secondary | ICD-10-CM | POA: Diagnosis not present

## 2016-04-18 DIAGNOSIS — M25551 Pain in right hip: Secondary | ICD-10-CM | POA: Diagnosis not present

## 2016-04-18 DIAGNOSIS — I129 Hypertensive chronic kidney disease with stage 1 through stage 4 chronic kidney disease, or unspecified chronic kidney disease: Secondary | ICD-10-CM | POA: Diagnosis not present

## 2016-04-18 DIAGNOSIS — S3282XD Multiple fractures of pelvis without disruption of pelvic ring, subsequent encounter for fracture with routine healing: Secondary | ICD-10-CM | POA: Diagnosis not present

## 2016-04-18 DIAGNOSIS — M150 Primary generalized (osteo)arthritis: Secondary | ICD-10-CM | POA: Diagnosis not present

## 2016-04-18 DIAGNOSIS — M545 Low back pain: Secondary | ICD-10-CM | POA: Diagnosis not present

## 2016-04-20 DIAGNOSIS — M25551 Pain in right hip: Secondary | ICD-10-CM | POA: Diagnosis not present

## 2016-04-20 DIAGNOSIS — I129 Hypertensive chronic kidney disease with stage 1 through stage 4 chronic kidney disease, or unspecified chronic kidney disease: Secondary | ICD-10-CM | POA: Diagnosis not present

## 2016-04-20 DIAGNOSIS — M545 Low back pain: Secondary | ICD-10-CM | POA: Diagnosis not present

## 2016-04-20 DIAGNOSIS — M150 Primary generalized (osteo)arthritis: Secondary | ICD-10-CM | POA: Diagnosis not present

## 2016-04-20 DIAGNOSIS — M6281 Muscle weakness (generalized): Secondary | ICD-10-CM | POA: Diagnosis not present

## 2016-04-20 DIAGNOSIS — S3282XD Multiple fractures of pelvis without disruption of pelvic ring, subsequent encounter for fracture with routine healing: Secondary | ICD-10-CM | POA: Diagnosis not present

## 2016-04-26 DIAGNOSIS — S3282XD Multiple fractures of pelvis without disruption of pelvic ring, subsequent encounter for fracture with routine healing: Secondary | ICD-10-CM | POA: Diagnosis not present

## 2016-04-26 DIAGNOSIS — M25551 Pain in right hip: Secondary | ICD-10-CM | POA: Diagnosis not present

## 2016-04-26 DIAGNOSIS — M150 Primary generalized (osteo)arthritis: Secondary | ICD-10-CM | POA: Diagnosis not present

## 2016-04-26 DIAGNOSIS — M545 Low back pain: Secondary | ICD-10-CM | POA: Diagnosis not present

## 2016-04-26 DIAGNOSIS — M6281 Muscle weakness (generalized): Secondary | ICD-10-CM | POA: Diagnosis not present

## 2016-04-26 DIAGNOSIS — I129 Hypertensive chronic kidney disease with stage 1 through stage 4 chronic kidney disease, or unspecified chronic kidney disease: Secondary | ICD-10-CM | POA: Diagnosis not present

## 2016-04-28 DIAGNOSIS — Z681 Body mass index (BMI) 19 or less, adult: Secondary | ICD-10-CM | POA: Diagnosis not present

## 2016-04-28 DIAGNOSIS — M545 Low back pain: Secondary | ICD-10-CM | POA: Diagnosis not present

## 2016-04-29 DIAGNOSIS — S3282XD Multiple fractures of pelvis without disruption of pelvic ring, subsequent encounter for fracture with routine healing: Secondary | ICD-10-CM | POA: Diagnosis not present

## 2016-04-29 DIAGNOSIS — M6281 Muscle weakness (generalized): Secondary | ICD-10-CM | POA: Diagnosis not present

## 2016-04-29 DIAGNOSIS — M545 Low back pain: Secondary | ICD-10-CM | POA: Diagnosis not present

## 2016-04-29 DIAGNOSIS — M150 Primary generalized (osteo)arthritis: Secondary | ICD-10-CM | POA: Diagnosis not present

## 2016-04-29 DIAGNOSIS — I129 Hypertensive chronic kidney disease with stage 1 through stage 4 chronic kidney disease, or unspecified chronic kidney disease: Secondary | ICD-10-CM | POA: Diagnosis not present

## 2016-04-29 DIAGNOSIS — M25551 Pain in right hip: Secondary | ICD-10-CM | POA: Diagnosis not present

## 2016-05-02 ENCOUNTER — Ambulatory Visit: Payer: PRIVATE HEALTH INSURANCE | Admitting: Endocrinology

## 2016-05-05 DIAGNOSIS — S3282XD Multiple fractures of pelvis without disruption of pelvic ring, subsequent encounter for fracture with routine healing: Secondary | ICD-10-CM | POA: Diagnosis not present

## 2016-05-05 DIAGNOSIS — M25551 Pain in right hip: Secondary | ICD-10-CM | POA: Diagnosis not present

## 2016-05-05 DIAGNOSIS — M545 Low back pain: Secondary | ICD-10-CM | POA: Diagnosis not present

## 2016-05-05 DIAGNOSIS — M6281 Muscle weakness (generalized): Secondary | ICD-10-CM | POA: Diagnosis not present

## 2016-05-05 DIAGNOSIS — I129 Hypertensive chronic kidney disease with stage 1 through stage 4 chronic kidney disease, or unspecified chronic kidney disease: Secondary | ICD-10-CM | POA: Diagnosis not present

## 2016-05-05 DIAGNOSIS — M150 Primary generalized (osteo)arthritis: Secondary | ICD-10-CM | POA: Diagnosis not present

## 2016-05-06 DIAGNOSIS — M25551 Pain in right hip: Secondary | ICD-10-CM | POA: Diagnosis not present

## 2016-05-06 DIAGNOSIS — M150 Primary generalized (osteo)arthritis: Secondary | ICD-10-CM | POA: Diagnosis not present

## 2016-05-06 DIAGNOSIS — N390 Urinary tract infection, site not specified: Secondary | ICD-10-CM | POA: Diagnosis not present

## 2016-05-06 DIAGNOSIS — M6281 Muscle weakness (generalized): Secondary | ICD-10-CM | POA: Diagnosis not present

## 2016-05-06 DIAGNOSIS — M545 Low back pain: Secondary | ICD-10-CM | POA: Diagnosis not present

## 2016-05-06 DIAGNOSIS — I129 Hypertensive chronic kidney disease with stage 1 through stage 4 chronic kidney disease, or unspecified chronic kidney disease: Secondary | ICD-10-CM | POA: Diagnosis not present

## 2016-05-06 DIAGNOSIS — S3282XD Multiple fractures of pelvis without disruption of pelvic ring, subsequent encounter for fracture with routine healing: Secondary | ICD-10-CM | POA: Diagnosis not present

## 2016-05-10 DIAGNOSIS — M25551 Pain in right hip: Secondary | ICD-10-CM | POA: Diagnosis not present

## 2016-05-10 DIAGNOSIS — S3282XD Multiple fractures of pelvis without disruption of pelvic ring, subsequent encounter for fracture with routine healing: Secondary | ICD-10-CM | POA: Diagnosis not present

## 2016-05-10 DIAGNOSIS — M545 Low back pain: Secondary | ICD-10-CM | POA: Diagnosis not present

## 2016-05-10 DIAGNOSIS — I129 Hypertensive chronic kidney disease with stage 1 through stage 4 chronic kidney disease, or unspecified chronic kidney disease: Secondary | ICD-10-CM | POA: Diagnosis not present

## 2016-05-10 DIAGNOSIS — M6281 Muscle weakness (generalized): Secondary | ICD-10-CM | POA: Diagnosis not present

## 2016-05-10 DIAGNOSIS — M150 Primary generalized (osteo)arthritis: Secondary | ICD-10-CM | POA: Diagnosis not present

## 2016-05-12 DIAGNOSIS — M6281 Muscle weakness (generalized): Secondary | ICD-10-CM | POA: Diagnosis not present

## 2016-05-12 DIAGNOSIS — I129 Hypertensive chronic kidney disease with stage 1 through stage 4 chronic kidney disease, or unspecified chronic kidney disease: Secondary | ICD-10-CM | POA: Diagnosis not present

## 2016-05-12 DIAGNOSIS — M150 Primary generalized (osteo)arthritis: Secondary | ICD-10-CM | POA: Diagnosis not present

## 2016-05-12 DIAGNOSIS — S3282XD Multiple fractures of pelvis without disruption of pelvic ring, subsequent encounter for fracture with routine healing: Secondary | ICD-10-CM | POA: Diagnosis not present

## 2016-05-12 DIAGNOSIS — M25551 Pain in right hip: Secondary | ICD-10-CM | POA: Diagnosis not present

## 2016-05-12 DIAGNOSIS — M545 Low back pain: Secondary | ICD-10-CM | POA: Diagnosis not present

## 2016-05-17 DIAGNOSIS — Z23 Encounter for immunization: Secondary | ICD-10-CM | POA: Diagnosis not present

## 2016-05-18 DIAGNOSIS — F338 Other recurrent depressive disorders: Secondary | ICD-10-CM | POA: Diagnosis not present

## 2016-05-18 DIAGNOSIS — F419 Anxiety disorder, unspecified: Secondary | ICD-10-CM | POA: Diagnosis not present

## 2016-05-27 ENCOUNTER — Ambulatory Visit (INDEPENDENT_AMBULATORY_CARE_PROVIDER_SITE_OTHER): Payer: Medicare Other | Admitting: Endocrinology

## 2016-05-27 ENCOUNTER — Encounter: Payer: Self-pay | Admitting: Endocrinology

## 2016-05-27 VITALS — BP 127/55 | HR 66 | Ht 60.0 in | Wt 97.0 lb

## 2016-05-27 DIAGNOSIS — E038 Other specified hypothyroidism: Secondary | ICD-10-CM

## 2016-05-27 DIAGNOSIS — E063 Autoimmune thyroiditis: Secondary | ICD-10-CM

## 2016-05-27 LAB — TSH: TSH: 3.03 u[IU]/mL (ref 0.35–4.50)

## 2016-05-27 LAB — T4, FREE: FREE T4: 0.72 ng/dL (ref 0.60–1.60)

## 2016-05-27 NOTE — Progress Notes (Signed)
Please let her son know that the lab result is normal.  Please confirm with the nursing home that they're giving her only one tablet of 112 g Synthroid daily Fax results to the nursing home

## 2016-05-27 NOTE — Progress Notes (Signed)
Patient ID: [name], female   DOB: March 09, 1920, 82 y.o.   MRN: [id number]   Reason for Appointment:  Hypothyroidism, f/u visit   History of Present Illness:    HYPOTHYROIDISM  was first diagnosed in 1948 when she had thyroidectomy for Graves' disease   The symptoms consistent with hypothyroidism  previously were: feeling sleepy during the day despite sleeping well at night and needing naps. She also has had some dry skin but no significant weight gain or cold intolerance. She has had some hair loss which is not new  She had been referred here because of inadequate control of her hypothyroidism despite increasing her Synthroid doses progressively  Since she was getting iron and calcium tablets in the morning with her Synthroid the timing of these was changed on visit in 02/2013.  She is now taking her Synthroid separately at 7 AM She is taking her calcium supplement at lunch and iron at bedtime  This was however not verified on her [facility] today and this was not available Her Synthroid dose has been adjusted periodically since her initial visit and appears to be requiring progressively less doses now  On her last visit in 2/17 her TSH was 1.8 and her dose was changed from 2 tablets of 125 g daily to take 2 tablets 6 days a week and 1 tablet once a week  She has not been seen in follow-up since then However during her hospitalization in instead of had gone up significantly to 9.25 and her dose was increased to 2 tablets of 125 g daily instead of 112.  Previously in 6/17 her TSH was only 0.08 without a free T4 level done  Apparently her dose was reduced down to 112 g once a day and not clear why it was reduced significantly Subsequently TSH was normal in 7/17 at the new nursing home  Review of her medication record at day nursing home show that she is getting Synthroid before breakfast and getting her calcium, vitamins and iron at lunchtime Also appears that she is now taking  biotin 300 g, not clear for how long and why  She does not think she had any change in her energy level or any change in cold intolerance Her appetite is fairly good She is able to do her usual activities and move around with a walker. No change in weight   Wt Readings from Last 3 Encounters:  05/27/16 97 lb (44 kg)  03/22/16 96 lb 12.8 oz (43.9 kg)  03/21/16 96 lb 12.8 oz (43.9 kg)   Previous TSH levels: on 02/15/13;  result was 21.34 TSH in 9/15 was 0.265  Lab Results  Component Value Date   TSH 3.03 05/27/2016   TSH 1.857 03/01/2016   TSH 0.082 (L) 02/16/2016   FREET4 0.72 05/27/2016   FREET4 1.08 10/05/2015   FREET4 0.63 08/04/2015           Past Medical History:  Diagnosis Date  . Anemia   . CKD (chronic kidney disease) stage 3, GFR 30-59 ml/min 06/07/2015  . Dementia   . Escherichia coli urinary tract infection 02/16/16   Cipro  . Gastric ulcer    gi bleed secondary  . HCAP (healthcare-associated pneumonia) 06/06/2015  . History of fracture of right hip 05/21/2012   ORIF 05/13/12   . History of subdural hematoma (post traumatic) 05/21/2012   Dx on MR I in September 2013   . Hypertension   . Hypothyroidism   . Macular  degeneration   . Vertigo     Past Surgical History:  Procedure Laterality Date  . ABDOMINAL HYSTERECTOMY    . CARPAL TUNNEL RELEASE    . FEMUR IM NAIL  05/13/2012   Procedure: INTRAMEDULLARY (IM) NAIL FEMORAL;  Surgeon: Senaida Lange, MD;  Location: [facility] OR;  Service: Orthopedics;  Laterality: Right;  . FRACTURE SURGERY    . MASTECTOMY    . THYROID SURGERY      Family History  Problem Relation Age of Onset  . Thyroid disease Neg Hx   . Cancer Neg Hx   . Diabetes Neg Hx   . Heart disease Neg Hx   . Stroke Neg Hx    Social History:  reports that she has never smoked. She has never used smokeless tobacco. She reports that she does not drink alcohol or use drugs.  Allergies:  Allergies  Allergen Reactions  . Amoxicillin-Pot Clavulanate      REACTION: UNKNOWN REACTION  . Cephalexin     REACTION: UNKNOWN REACTION  . Nsaids     UNKNOWN REACTION      Medication List       Accurate as of 05/27/16 10:57 AM. Always use your most recent med list.          Biotin 300 MCG Tabs Take 300 mcg by mouth daily.   DAILY VITE Tabs Take 1 tablet by mouth daily.   donepezil 10 MG tablet Commonly known as:  ARICEPT Take 10 mg by mouth daily.   escitalopram 10 MG tablet Commonly known as:  LEXAPRO Take 10 mg by mouth daily.   ferrous sulfate 325 (65 FE) MG tablet Take 1 tablet (325 mg total) by mouth 3 (three) times daily with meals.   HYDROcodone-acetaminophen 5-325 MG tablet Commonly known as:  NORCO/VICODIN Take 1 tablet by mouth every 4 (four) hours as needed for moderate pain.   levothyroxine 112 MCG tablet Commonly known as:  SYNTHROID, LEVOTHROID Take 1 tablet (112 mcg total) by mouth daily.   loperamide 2 MG tablet Commonly known as:  IMODIUM A-D Take 2 mg by mouth 2 (two) times daily.   loratadine 10 MG tablet Commonly known as:  CLARITIN Take 10 mg by mouth daily.   losartan 50 MG tablet Commonly known as:  COZAAR Take 50 mg by mouth daily.   Lutein 6 MG Caps Take 1 capsule by mouth daily.   MAPAP 325 MG tablet Generic drug:  acetaminophen Take 650 mg by mouth every 4 (four) hours as needed for mild pain or moderate pain.   meclizine 25 MG tablet Commonly known as:  ANTIVERT Take 25 mg by mouth every 8 (eight) hours as needed for dizziness.   memantine 10 MG tablet Commonly known as:  NAMENDA Take 10 mg by mouth 2 (two) times daily.   omeprazole 20 MG capsule Commonly known as:  PRILOSEC Take 20 mg by mouth daily.   oyster calcium 500 MG Tabs tablet Take 500 mg of elemental calcium by mouth daily.   psyllium 28 % packet Commonly known as:  METAMUCIL SMOOTH TEXTURE Take 1 packet by mouth daily. Special instructions, 1 packet by mouth for constipation every 8 hours PRN   SYSTANE  OP Apply 1 drop to eye 4 (four) times daily.       Review of Systems:            Patient has long-standing history of dementia History of Iron deficiency anemia        Examination:  BP (!) 127/55   Pulse 66   Ht 5' (1.524 m)   Wt 97 lb (44 kg)   BMI 18.94 kg/m   She is  pleasant, alert, looks well          No puffiness of the face or eyes         The thyroid is not enlarged.  Deep tendon reflexes At biceps appear normal  Skin appears normal  Assessments .   Postsurgical Hypothyroidism, long-standing  Although she was previously requiring large doses of Synthroid, about 250 g daily her dose has been drastically reduced since her hospitalization in June Not clear why her requirement has dropped without any change in her clinical status or other medications  She is currently taking biotin which may interfere with a free T4 assay although her dose is relatively low at 300 g  Currently she appears to be doing well and clinically looks euthyroid Currently taking 112 g daily and separately from iron and calcium    To decide dosage of Synthroid based on TSH level today, followup in 6 months unless another dose changes needed  Treg Diemer 05/27/2016, 10:57 AM   Addendum: TSH is  normal and she will continue the same dose

## 2016-06-02 DIAGNOSIS — F338 Other recurrent depressive disorders: Secondary | ICD-10-CM | POA: Diagnosis not present

## 2016-06-02 DIAGNOSIS — F419 Anxiety disorder, unspecified: Secondary | ICD-10-CM | POA: Diagnosis not present

## 2016-06-02 DIAGNOSIS — F0390 Unspecified dementia without behavioral disturbance: Secondary | ICD-10-CM | POA: Diagnosis not present

## 2016-06-08 DIAGNOSIS — H04123 Dry eye syndrome of bilateral lacrimal glands: Secondary | ICD-10-CM | POA: Diagnosis not present

## 2016-06-08 DIAGNOSIS — I10 Essential (primary) hypertension: Secondary | ICD-10-CM | POA: Diagnosis not present

## 2016-06-08 DIAGNOSIS — M150 Primary generalized (osteo)arthritis: Secondary | ICD-10-CM | POA: Diagnosis not present

## 2016-06-08 DIAGNOSIS — F0390 Unspecified dementia without behavioral disturbance: Secondary | ICD-10-CM | POA: Diagnosis not present

## 2016-06-08 DIAGNOSIS — E039 Hypothyroidism, unspecified: Secondary | ICD-10-CM | POA: Diagnosis not present

## 2016-06-08 DIAGNOSIS — K219 Gastro-esophageal reflux disease without esophagitis: Secondary | ICD-10-CM | POA: Diagnosis not present

## 2016-06-08 DIAGNOSIS — D649 Anemia, unspecified: Secondary | ICD-10-CM | POA: Diagnosis not present

## 2016-06-08 DIAGNOSIS — J309 Allergic rhinitis, unspecified: Secondary | ICD-10-CM | POA: Diagnosis not present

## 2016-06-14 DIAGNOSIS — M79675 Pain in left toe(s): Secondary | ICD-10-CM | POA: Diagnosis not present

## 2016-06-14 DIAGNOSIS — M79674 Pain in right toe(s): Secondary | ICD-10-CM | POA: Diagnosis not present

## 2016-06-14 DIAGNOSIS — B351 Tinea unguium: Secondary | ICD-10-CM | POA: Diagnosis not present

## 2016-06-16 DIAGNOSIS — F0390 Unspecified dementia without behavioral disturbance: Secondary | ICD-10-CM | POA: Diagnosis not present

## 2016-06-16 DIAGNOSIS — F338 Other recurrent depressive disorders: Secondary | ICD-10-CM | POA: Diagnosis not present

## 2016-06-16 DIAGNOSIS — F419 Anxiety disorder, unspecified: Secondary | ICD-10-CM | POA: Diagnosis not present

## 2016-06-21 DIAGNOSIS — F0390 Unspecified dementia without behavioral disturbance: Secondary | ICD-10-CM | POA: Diagnosis not present

## 2016-06-21 DIAGNOSIS — F419 Anxiety disorder, unspecified: Secondary | ICD-10-CM | POA: Diagnosis not present

## 2016-06-22 DIAGNOSIS — E039 Hypothyroidism, unspecified: Secondary | ICD-10-CM | POA: Diagnosis not present

## 2016-06-22 DIAGNOSIS — E119 Type 2 diabetes mellitus without complications: Secondary | ICD-10-CM | POA: Diagnosis not present

## 2016-06-22 DIAGNOSIS — I10 Essential (primary) hypertension: Secondary | ICD-10-CM | POA: Diagnosis not present

## 2016-06-29 DIAGNOSIS — I10 Essential (primary) hypertension: Secondary | ICD-10-CM | POA: Diagnosis not present

## 2016-06-29 DIAGNOSIS — D649 Anemia, unspecified: Secondary | ICD-10-CM | POA: Diagnosis not present

## 2016-06-29 DIAGNOSIS — M150 Primary generalized (osteo)arthritis: Secondary | ICD-10-CM | POA: Diagnosis not present

## 2016-06-29 DIAGNOSIS — D72828 Other elevated white blood cell count: Secondary | ICD-10-CM | POA: Diagnosis not present

## 2016-06-29 DIAGNOSIS — K219 Gastro-esophageal reflux disease without esophagitis: Secondary | ICD-10-CM | POA: Diagnosis not present

## 2016-06-29 DIAGNOSIS — J309 Allergic rhinitis, unspecified: Secondary | ICD-10-CM | POA: Diagnosis not present

## 2016-06-29 DIAGNOSIS — H04123 Dry eye syndrome of bilateral lacrimal glands: Secondary | ICD-10-CM | POA: Diagnosis not present

## 2016-06-29 DIAGNOSIS — E039 Hypothyroidism, unspecified: Secondary | ICD-10-CM | POA: Diagnosis not present

## 2016-07-20 DIAGNOSIS — D649 Anemia, unspecified: Secondary | ICD-10-CM | POA: Diagnosis not present

## 2016-07-20 DIAGNOSIS — F338 Other recurrent depressive disorders: Secondary | ICD-10-CM | POA: Diagnosis not present

## 2016-07-20 DIAGNOSIS — E039 Hypothyroidism, unspecified: Secondary | ICD-10-CM | POA: Diagnosis not present

## 2016-07-20 DIAGNOSIS — I10 Essential (primary) hypertension: Secondary | ICD-10-CM | POA: Diagnosis not present

## 2016-07-20 DIAGNOSIS — F0390 Unspecified dementia without behavioral disturbance: Secondary | ICD-10-CM | POA: Diagnosis not present

## 2016-07-20 DIAGNOSIS — F419 Anxiety disorder, unspecified: Secondary | ICD-10-CM | POA: Diagnosis not present

## 2016-07-21 DIAGNOSIS — F419 Anxiety disorder, unspecified: Secondary | ICD-10-CM | POA: Diagnosis not present

## 2016-07-21 DIAGNOSIS — F0390 Unspecified dementia without behavioral disturbance: Secondary | ICD-10-CM | POA: Diagnosis not present

## 2016-07-21 DIAGNOSIS — F338 Other recurrent depressive disorders: Secondary | ICD-10-CM | POA: Diagnosis not present

## 2016-07-27 DIAGNOSIS — H04123 Dry eye syndrome of bilateral lacrimal glands: Secondary | ICD-10-CM | POA: Diagnosis not present

## 2016-07-27 DIAGNOSIS — I10 Essential (primary) hypertension: Secondary | ICD-10-CM | POA: Diagnosis not present

## 2016-07-27 DIAGNOSIS — D72828 Other elevated white blood cell count: Secondary | ICD-10-CM | POA: Diagnosis not present

## 2016-07-27 DIAGNOSIS — J309 Allergic rhinitis, unspecified: Secondary | ICD-10-CM | POA: Diagnosis not present

## 2016-07-27 DIAGNOSIS — E039 Hypothyroidism, unspecified: Secondary | ICD-10-CM | POA: Diagnosis not present

## 2016-07-27 DIAGNOSIS — K219 Gastro-esophageal reflux disease without esophagitis: Secondary | ICD-10-CM | POA: Diagnosis not present

## 2016-07-27 DIAGNOSIS — M150 Primary generalized (osteo)arthritis: Secondary | ICD-10-CM | POA: Diagnosis not present

## 2016-07-27 DIAGNOSIS — D649 Anemia, unspecified: Secondary | ICD-10-CM | POA: Diagnosis not present

## 2016-08-02 DIAGNOSIS — M150 Primary generalized (osteo)arthritis: Secondary | ICD-10-CM | POA: Diagnosis not present

## 2016-08-02 DIAGNOSIS — D649 Anemia, unspecified: Secondary | ICD-10-CM | POA: Diagnosis not present

## 2016-08-02 DIAGNOSIS — I10 Essential (primary) hypertension: Secondary | ICD-10-CM | POA: Diagnosis not present

## 2016-08-02 DIAGNOSIS — K219 Gastro-esophageal reflux disease without esophagitis: Secondary | ICD-10-CM | POA: Diagnosis not present

## 2016-08-02 DIAGNOSIS — H04123 Dry eye syndrome of bilateral lacrimal glands: Secondary | ICD-10-CM | POA: Diagnosis not present

## 2016-08-02 DIAGNOSIS — D72828 Other elevated white blood cell count: Secondary | ICD-10-CM | POA: Diagnosis not present

## 2016-08-02 DIAGNOSIS — J309 Allergic rhinitis, unspecified: Secondary | ICD-10-CM | POA: Diagnosis not present

## 2016-08-02 DIAGNOSIS — E039 Hypothyroidism, unspecified: Secondary | ICD-10-CM | POA: Diagnosis not present

## 2016-08-03 DIAGNOSIS — N390 Urinary tract infection, site not specified: Secondary | ICD-10-CM | POA: Diagnosis not present

## 2016-08-05 DIAGNOSIS — N390 Urinary tract infection, site not specified: Secondary | ICD-10-CM | POA: Diagnosis not present

## 2016-08-19 DIAGNOSIS — D649 Anemia, unspecified: Secondary | ICD-10-CM | POA: Diagnosis not present

## 2016-08-19 DIAGNOSIS — F338 Other recurrent depressive disorders: Secondary | ICD-10-CM | POA: Diagnosis not present

## 2016-08-19 DIAGNOSIS — F0390 Unspecified dementia without behavioral disturbance: Secondary | ICD-10-CM | POA: Diagnosis not present

## 2016-08-19 DIAGNOSIS — E039 Hypothyroidism, unspecified: Secondary | ICD-10-CM | POA: Diagnosis not present

## 2016-08-19 DIAGNOSIS — F419 Anxiety disorder, unspecified: Secondary | ICD-10-CM | POA: Diagnosis not present

## 2016-08-19 DIAGNOSIS — I10 Essential (primary) hypertension: Secondary | ICD-10-CM | POA: Diagnosis not present

## 2016-08-24 DIAGNOSIS — Z0000 Encounter for general adult medical examination without abnormal findings: Secondary | ICD-10-CM | POA: Diagnosis not present

## 2016-08-25 DIAGNOSIS — F419 Anxiety disorder, unspecified: Secondary | ICD-10-CM | POA: Diagnosis not present

## 2016-08-25 DIAGNOSIS — F0390 Unspecified dementia without behavioral disturbance: Secondary | ICD-10-CM | POA: Diagnosis not present

## 2016-08-25 DIAGNOSIS — F338 Other recurrent depressive disorders: Secondary | ICD-10-CM | POA: Diagnosis not present

## 2016-08-30 DIAGNOSIS — M79675 Pain in left toe(s): Secondary | ICD-10-CM | POA: Diagnosis not present

## 2016-08-30 DIAGNOSIS — M79674 Pain in right toe(s): Secondary | ICD-10-CM | POA: Diagnosis not present

## 2016-08-30 DIAGNOSIS — B351 Tinea unguium: Secondary | ICD-10-CM | POA: Diagnosis not present

## 2016-09-20 ENCOUNTER — Encounter: Payer: Self-pay | Admitting: Internal Medicine

## 2016-09-20 DIAGNOSIS — F338 Other recurrent depressive disorders: Secondary | ICD-10-CM | POA: Diagnosis not present

## 2016-09-20 DIAGNOSIS — I10 Essential (primary) hypertension: Secondary | ICD-10-CM | POA: Diagnosis not present

## 2016-09-20 DIAGNOSIS — F419 Anxiety disorder, unspecified: Secondary | ICD-10-CM | POA: Diagnosis not present

## 2016-09-20 DIAGNOSIS — D649 Anemia, unspecified: Secondary | ICD-10-CM | POA: Diagnosis not present

## 2016-09-20 DIAGNOSIS — F0390 Unspecified dementia without behavioral disturbance: Secondary | ICD-10-CM | POA: Diagnosis not present

## 2016-09-20 DIAGNOSIS — E039 Hypothyroidism, unspecified: Secondary | ICD-10-CM | POA: Diagnosis not present

## 2016-09-21 DIAGNOSIS — H353123 Nonexudative age-related macular degeneration, left eye, advanced atrophic without subfoveal involvement: Secondary | ICD-10-CM | POA: Diagnosis not present

## 2016-09-21 DIAGNOSIS — H04123 Dry eye syndrome of bilateral lacrimal glands: Secondary | ICD-10-CM | POA: Diagnosis not present

## 2016-09-21 DIAGNOSIS — F0390 Unspecified dementia without behavioral disturbance: Secondary | ICD-10-CM | POA: Diagnosis not present

## 2016-09-21 DIAGNOSIS — E039 Hypothyroidism, unspecified: Secondary | ICD-10-CM | POA: Diagnosis not present

## 2016-09-21 DIAGNOSIS — D649 Anemia, unspecified: Secondary | ICD-10-CM | POA: Diagnosis not present

## 2016-09-21 DIAGNOSIS — M150 Primary generalized (osteo)arthritis: Secondary | ICD-10-CM | POA: Diagnosis not present

## 2016-09-21 DIAGNOSIS — J309 Allergic rhinitis, unspecified: Secondary | ICD-10-CM | POA: Diagnosis not present

## 2016-09-21 DIAGNOSIS — D72828 Other elevated white blood cell count: Secondary | ICD-10-CM | POA: Diagnosis not present

## 2016-09-21 DIAGNOSIS — H353213 Exudative age-related macular degeneration, right eye, with inactive scar: Secondary | ICD-10-CM | POA: Diagnosis not present

## 2016-09-21 DIAGNOSIS — I10 Essential (primary) hypertension: Secondary | ICD-10-CM | POA: Diagnosis not present

## 2016-09-21 DIAGNOSIS — K219 Gastro-esophageal reflux disease without esophagitis: Secondary | ICD-10-CM | POA: Diagnosis not present

## 2016-09-22 DIAGNOSIS — F338 Other recurrent depressive disorders: Secondary | ICD-10-CM | POA: Diagnosis not present

## 2016-09-22 DIAGNOSIS — F0390 Unspecified dementia without behavioral disturbance: Secondary | ICD-10-CM | POA: Diagnosis not present

## 2016-09-22 DIAGNOSIS — F419 Anxiety disorder, unspecified: Secondary | ICD-10-CM | POA: Diagnosis not present

## 2016-09-27 ENCOUNTER — Ambulatory Visit (INDEPENDENT_AMBULATORY_CARE_PROVIDER_SITE_OTHER): Payer: Medicare Other | Admitting: Endocrinology

## 2016-09-27 ENCOUNTER — Encounter: Payer: Self-pay | Admitting: Endocrinology

## 2016-09-27 VITALS — BP 128/62 | HR 68 | Temp 98.4°F | Resp 14 | Ht 60.0 in | Wt 101.2 lb

## 2016-09-27 DIAGNOSIS — E038 Other specified hypothyroidism: Secondary | ICD-10-CM | POA: Diagnosis not present

## 2016-09-27 DIAGNOSIS — E063 Autoimmune thyroiditis: Secondary | ICD-10-CM

## 2016-09-27 LAB — T4, FREE: FREE T4: 0.68 ng/dL (ref 0.60–1.60)

## 2016-09-27 LAB — TSH: TSH: 6.73 u[IU]/mL — ABNORMAL HIGH (ref 0.35–4.50)

## 2016-09-27 NOTE — Progress Notes (Signed)
Patient ID: [name], female   DOB: 1919-11-20, 83 y.o.   MRN: [id number]   Reason for Appointment:  Hypothyroidism, f/u visit   History of Present Illness:    HYPOTHYROIDISM  was first diagnosed in 1948 when she had thyroidectomy for Graves' disease   The symptoms consistent with hypothyroidism  previously were: feeling sleepy during the day despite sleeping well at night and needing naps. She also has had some dry skin but no significant weight gain or cold intolerance. She has had some hair loss which is not new  She had been referred here because of inadequate control of her hypothyroidism despite increasing her Synthroid doses progressively  Since she was getting iron and calcium tablets in the morning with her Synthroid the timing of these was changed on visit in 02/2013.  Her Synthroid dose has been adjusted periodically since her initial visit  Apparently her dose was reduced down to 112 g once a day at the nursing home even though she was taking about twice as much previously  Subsequently TSH was normal in 10/17   RECENT history:  She has been requiring progressively less levothyroxine over the last year She is now taking her Synthroid separately at 6 AM She is taking her calcium supplement at lunch and no iron currently  She does not think she had any change in her energy level or any change in cold intolerance Her appetite is fairly good, she does get some Ensure and is gaining some weight  She is able to do her usual activities and move around with a walker.   Wt Readings from Last 3 Encounters:  09/27/16 101 lb 3.2 oz (45.9 kg)  05/27/16 97 lb (44 kg)  03/22/16 96 lb 12.8 oz (43.9 kg)      Lab Results  Component Value Date   TSH 3.03 05/27/2016   TSH 1.857 03/01/2016   TSH 0.082 (L) 02/16/2016   FREET4 0.72 05/27/2016   FREET4 1.08 10/05/2015   FREET4 0.63 08/04/2015           Past Medical History:  Diagnosis Date  . Anemia   . CKD  (chronic kidney disease) stage 3, GFR 30-59 ml/min 06/07/2015  . Dementia   . Escherichia coli urinary tract infection 02/16/16   Cipro  . Gastric ulcer    gi bleed secondary  . HCAP (healthcare-associated pneumonia) 06/06/2015  . History of fracture of right hip 05/21/2012   ORIF 05/13/12   . History of subdural hematoma (post traumatic) 05/21/2012   Dx on MR I in September 2013   . Hypertension   . Hypothyroidism   . Macular degeneration   . Vertigo     Past Surgical History:  Procedure Laterality Date  . ABDOMINAL HYSTERECTOMY    . CARPAL TUNNEL RELEASE    . FEMUR IM NAIL  05/13/2012   Procedure: INTRAMEDULLARY (IM) NAIL FEMORAL;  Surgeon: Senaida Lange, MD;  Location: [facility] OR;  Service: Orthopedics;  Laterality: Right;  . FRACTURE SURGERY    . MASTECTOMY    . THYROID SURGERY      Family History  Problem Relation Age of Onset  . Thyroid disease Neg Hx   . Cancer Neg Hx   . Diabetes Neg Hx   . Heart disease Neg Hx   . Stroke Neg Hx    Social History:  reports that she has never smoked. She has never used smokeless tobacco. She reports that she does not drink alcohol or  use drugs.  Allergies:  Allergies  Allergen Reactions  . Amoxicillin-Pot Clavulanate     REACTION: UNKNOWN REACTION  . Cephalexin     REACTION: UNKNOWN REACTION  . Nsaids     UNKNOWN REACTION    Allergies as of 09/27/2016      Reactions   Amoxicillin-pot Clavulanate    REACTION: UNKNOWN REACTION   Cephalexin    REACTION: UNKNOWN REACTION   Nsaids    UNKNOWN REACTION      Medication List       Accurate as of 09/27/16 11:00 AM. Always use your most recent med list.          Biotin 300 MCG Tabs Take 300 mcg by mouth daily.   DAILY VITE Tabs Take 1 tablet by mouth daily.   donepezil 10 MG tablet Commonly known as:  ARICEPT Take 10 mg by mouth daily.   escitalopram 10 MG tablet Commonly known as:  LEXAPRO Take 10 mg by mouth daily.   ferrous sulfate 325 (65 FE) MG tablet Take 1  tablet (325 mg total) by mouth 3 (three) times daily with meals.   HYDROcodone-acetaminophen 5-325 MG tablet Commonly known as:  NORCO/VICODIN Take 1 tablet by mouth every 4 (four) hours as needed for moderate pain.   levothyroxine 112 MCG tablet Commonly known as:  SYNTHROID, LEVOTHROID Take 1 tablet (112 mcg total) by mouth daily.   loperamide 2 MG tablet Commonly known as:  IMODIUM A-D Take 2 mg by mouth 2 (two) times daily.   loperamide 2 MG capsule Commonly known as:  IMODIUM   loratadine 10 MG tablet Commonly known as:  CLARITIN Take 10 mg by mouth daily.   losartan 50 MG tablet Commonly known as:  COZAAR Take 50 mg by mouth daily.   Lutein 6 MG Caps Take 1 capsule by mouth daily.   MAPAP 325 MG tablet Generic drug:  acetaminophen Take 650 mg by mouth every 4 (four) hours as needed for mild pain or moderate pain.   meclizine 25 MG tablet Commonly known as:  ANTIVERT Take 25 mg by mouth every 8 (eight) hours as needed for dizziness.   memantine 10 MG tablet Commonly known as:  NAMENDA Take 10 mg by mouth 2 (two) times daily.   omeprazole 20 MG capsule Commonly known as:  PRILOSEC Take 20 mg by mouth daily.   oyster calcium 500 MG Tabs tablet Take 500 mg of elemental calcium by mouth daily.   psyllium 28 % packet Commonly known as:  METAMUCIL SMOOTH TEXTURE Take 1 packet by mouth daily. Special instructions, 1 packet by mouth for constipation every 8 hours PRN   SYSTANE OP Apply 1 drop to eye 4 (four) times daily.       Review of Systems:            Patient has long-standing history of dementia, On treatment  Recently having difficulties with macular degeneration and worsening vision        Examination:    BP 128/62   Pulse 68   Temp 98.4 F (36.9 C) (Oral)   Resp 14   Ht 5' (1.524 m)   Wt 101 lb 3.2 oz (45.9 kg)   SpO2 98%   BMI 19.76 kg/m   She is  pleasant, alert, looks well  Eyes did not show any swelling        The thyroid is not  palpable Reflexes at biceps are normal No peripheral edema.  Skin appears normal  Assessments .  Postsurgical Hypothyroidism, long-standing  Although she was previously requiring large doses of Synthroid, about 250 g daily her dose has been much lower since 6/17 Subsequently TSH has been normal as of 10/17 Currently not taking any interacting supplements with her levothyroxine in the morning Clinically appears to be looking fairly good  Currently taking 112 g daily and separately from iron and calcium    To decide dosage of Synthroid based on TSH level today, followup in 6 months unless another dose changes needed  Prudence Heiny 09/27/2016, 11:00 AM

## 2016-10-03 DIAGNOSIS — H52203 Unspecified astigmatism, bilateral: Secondary | ICD-10-CM | POA: Diagnosis not present

## 2016-10-03 DIAGNOSIS — H01001 Unspecified blepharitis right upper eyelid: Secondary | ICD-10-CM | POA: Diagnosis not present

## 2016-10-03 DIAGNOSIS — H04123 Dry eye syndrome of bilateral lacrimal glands: Secondary | ICD-10-CM | POA: Diagnosis not present

## 2016-10-03 DIAGNOSIS — H353213 Exudative age-related macular degeneration, right eye, with inactive scar: Secondary | ICD-10-CM | POA: Diagnosis not present

## 2016-10-18 DIAGNOSIS — E039 Hypothyroidism, unspecified: Secondary | ICD-10-CM | POA: Diagnosis not present

## 2016-10-18 DIAGNOSIS — F0390 Unspecified dementia without behavioral disturbance: Secondary | ICD-10-CM | POA: Diagnosis not present

## 2016-10-18 DIAGNOSIS — F419 Anxiety disorder, unspecified: Secondary | ICD-10-CM | POA: Diagnosis not present

## 2016-10-18 DIAGNOSIS — F338 Other recurrent depressive disorders: Secondary | ICD-10-CM | POA: Diagnosis not present

## 2016-10-18 DIAGNOSIS — D649 Anemia, unspecified: Secondary | ICD-10-CM | POA: Diagnosis not present

## 2016-10-18 DIAGNOSIS — I10 Essential (primary) hypertension: Secondary | ICD-10-CM | POA: Diagnosis not present

## 2016-10-19 DIAGNOSIS — D72828 Other elevated white blood cell count: Secondary | ICD-10-CM | POA: Diagnosis not present

## 2016-10-19 DIAGNOSIS — E039 Hypothyroidism, unspecified: Secondary | ICD-10-CM | POA: Diagnosis not present

## 2016-10-19 DIAGNOSIS — D649 Anemia, unspecified: Secondary | ICD-10-CM | POA: Diagnosis not present

## 2016-10-19 DIAGNOSIS — I10 Essential (primary) hypertension: Secondary | ICD-10-CM | POA: Diagnosis not present

## 2016-10-19 DIAGNOSIS — K219 Gastro-esophageal reflux disease without esophagitis: Secondary | ICD-10-CM | POA: Diagnosis not present

## 2016-10-19 DIAGNOSIS — H04123 Dry eye syndrome of bilateral lacrimal glands: Secondary | ICD-10-CM | POA: Diagnosis not present

## 2016-10-19 DIAGNOSIS — M150 Primary generalized (osteo)arthritis: Secondary | ICD-10-CM | POA: Diagnosis not present

## 2016-10-19 DIAGNOSIS — J309 Allergic rhinitis, unspecified: Secondary | ICD-10-CM | POA: Diagnosis not present

## 2016-10-20 DIAGNOSIS — F0390 Unspecified dementia without behavioral disturbance: Secondary | ICD-10-CM | POA: Diagnosis not present

## 2016-10-20 DIAGNOSIS — F338 Other recurrent depressive disorders: Secondary | ICD-10-CM | POA: Diagnosis not present

## 2016-10-20 DIAGNOSIS — F419 Anxiety disorder, unspecified: Secondary | ICD-10-CM | POA: Diagnosis not present

## 2016-10-26 DIAGNOSIS — E039 Hypothyroidism, unspecified: Secondary | ICD-10-CM | POA: Diagnosis not present

## 2016-10-26 DIAGNOSIS — I10 Essential (primary) hypertension: Secondary | ICD-10-CM | POA: Diagnosis not present

## 2016-11-09 DIAGNOSIS — R4182 Altered mental status, unspecified: Secondary | ICD-10-CM | POA: Diagnosis not present

## 2016-11-10 DIAGNOSIS — N390 Urinary tract infection, site not specified: Secondary | ICD-10-CM | POA: Diagnosis not present

## 2016-11-13 ENCOUNTER — Inpatient Hospital Stay (HOSPITAL_COMMUNITY)
Admission: EM | Admit: 2016-11-13 | Discharge: 2016-11-22 | DRG: 682 | Disposition: A | Discharge status: Hospice | Payer: Medicare Other | Attending: Internal Medicine | Admitting: Internal Medicine

## 2016-11-13 ENCOUNTER — Emergency Department (HOSPITAL_COMMUNITY): Payer: Medicare Other

## 2016-11-13 ENCOUNTER — Encounter (HOSPITAL_COMMUNITY): Payer: Self-pay | Admitting: *Deleted

## 2016-11-13 DIAGNOSIS — S0280XA Fracture of other specified skull and facial bones, unspecified side, initial encounter for closed fracture: Secondary | ICD-10-CM

## 2016-11-13 DIAGNOSIS — S0083XA Contusion of other part of head, initial encounter: Secondary | ICD-10-CM | POA: Diagnosis present

## 2016-11-13 DIAGNOSIS — H3530 Unspecified macular degeneration: Secondary | ICD-10-CM | POA: Diagnosis present

## 2016-11-13 DIAGNOSIS — S61212A Laceration without foreign body of right middle finger without damage to nail, initial encounter: Secondary | ICD-10-CM | POA: Diagnosis not present

## 2016-11-13 DIAGNOSIS — J9600 Acute respiratory failure, unspecified whether with hypoxia or hypercapnia: Secondary | ICD-10-CM | POA: Diagnosis not present

## 2016-11-13 DIAGNOSIS — R509 Fever, unspecified: Secondary | ICD-10-CM | POA: Diagnosis present

## 2016-11-13 DIAGNOSIS — E038 Other specified hypothyroidism: Secondary | ICD-10-CM

## 2016-11-13 DIAGNOSIS — Y92129 Unspecified place in nursing home as the place of occurrence of the external cause: Secondary | ICD-10-CM | POA: Diagnosis not present

## 2016-11-13 DIAGNOSIS — Y95 Nosocomial condition: Secondary | ICD-10-CM | POA: Diagnosis present

## 2016-11-13 DIAGNOSIS — N179 Acute kidney failure, unspecified: Principal | ICD-10-CM | POA: Diagnosis present

## 2016-11-13 DIAGNOSIS — S0231XA Fracture of orbital floor, right side, initial encounter for closed fracture: Secondary | ICD-10-CM | POA: Diagnosis present

## 2016-11-13 DIAGNOSIS — S6991XA Unspecified injury of right wrist, hand and finger(s), initial encounter: Secondary | ICD-10-CM | POA: Diagnosis not present

## 2016-11-13 DIAGNOSIS — R279 Unspecified lack of coordination: Secondary | ICD-10-CM | POA: Diagnosis not present

## 2016-11-13 DIAGNOSIS — S52121A Displaced fracture of head of right radius, initial encounter for closed fracture: Secondary | ICD-10-CM | POA: Diagnosis not present

## 2016-11-13 DIAGNOSIS — R222 Localized swelling, mass and lump, trunk: Secondary | ICD-10-CM | POA: Diagnosis not present

## 2016-11-13 DIAGNOSIS — J189 Pneumonia, unspecified organism: Secondary | ICD-10-CM | POA: Diagnosis not present

## 2016-11-13 DIAGNOSIS — Z7189 Other specified counseling: Secondary | ICD-10-CM | POA: Diagnosis not present

## 2016-11-13 DIAGNOSIS — S62644A Nondisplaced fracture of proximal phalanx of right ring finger, initial encounter for closed fracture: Secondary | ICD-10-CM

## 2016-11-13 DIAGNOSIS — M542 Cervicalgia: Secondary | ICD-10-CM | POA: Diagnosis not present

## 2016-11-13 DIAGNOSIS — N183 Chronic kidney disease, stage 3 unspecified: Secondary | ICD-10-CM | POA: Diagnosis present

## 2016-11-13 DIAGNOSIS — S3993XA Unspecified injury of pelvis, initial encounter: Secondary | ICD-10-CM | POA: Diagnosis not present

## 2016-11-13 DIAGNOSIS — S0990XA Unspecified injury of head, initial encounter: Secondary | ICD-10-CM | POA: Diagnosis not present

## 2016-11-13 DIAGNOSIS — S61214A Laceration without foreign body of right ring finger without damage to nail, initial encounter: Secondary | ICD-10-CM

## 2016-11-13 DIAGNOSIS — S5291XA Unspecified fracture of right forearm, initial encounter for closed fracture: Secondary | ICD-10-CM | POA: Diagnosis present

## 2016-11-13 DIAGNOSIS — I10 Essential (primary) hypertension: Secondary | ICD-10-CM

## 2016-11-13 DIAGNOSIS — S62614A Displaced fracture of proximal phalanx of right ring finger, initial encounter for closed fracture: Secondary | ICD-10-CM | POA: Diagnosis present

## 2016-11-13 DIAGNOSIS — M25512 Pain in left shoulder: Secondary | ICD-10-CM | POA: Diagnosis not present

## 2016-11-13 DIAGNOSIS — S3991XA Unspecified injury of abdomen, initial encounter: Secondary | ICD-10-CM | POA: Diagnosis not present

## 2016-11-13 DIAGNOSIS — Z23 Encounter for immunization: Secondary | ICD-10-CM

## 2016-11-13 DIAGNOSIS — J9601 Acute respiratory failure with hypoxia: Secondary | ICD-10-CM | POA: Diagnosis present

## 2016-11-13 DIAGNOSIS — D62 Acute posthemorrhagic anemia: Secondary | ICD-10-CM | POA: Diagnosis present

## 2016-11-13 DIAGNOSIS — S199XXA Unspecified injury of neck, initial encounter: Secondary | ICD-10-CM | POA: Diagnosis not present

## 2016-11-13 DIAGNOSIS — S02401A Maxillary fracture, unspecified, initial encounter for closed fracture: Secondary | ICD-10-CM

## 2016-11-13 DIAGNOSIS — Z9181 History of falling: Secondary | ICD-10-CM | POA: Diagnosis not present

## 2016-11-13 DIAGNOSIS — F0390 Unspecified dementia without behavioral disturbance: Secondary | ICD-10-CM | POA: Diagnosis present

## 2016-11-13 DIAGNOSIS — S42202D Unspecified fracture of upper end of left humerus, subsequent encounter for fracture with routine healing: Secondary | ICD-10-CM | POA: Diagnosis not present

## 2016-11-13 DIAGNOSIS — S42292A Other displaced fracture of upper end of left humerus, initial encounter for closed fracture: Secondary | ICD-10-CM | POA: Diagnosis present

## 2016-11-13 DIAGNOSIS — D649 Anemia, unspecified: Secondary | ICD-10-CM | POA: Diagnosis present

## 2016-11-13 DIAGNOSIS — W06XXXA Fall from bed, initial encounter: Secondary | ICD-10-CM | POA: Diagnosis present

## 2016-11-13 DIAGNOSIS — H578 Other specified disorders of eye and adnexa: Secondary | ICD-10-CM | POA: Diagnosis not present

## 2016-11-13 DIAGNOSIS — S42202A Unspecified fracture of upper end of left humerus, initial encounter for closed fracture: Secondary | ICD-10-CM | POA: Diagnosis not present

## 2016-11-13 DIAGNOSIS — Z66 Do not resuscitate: Secondary | ICD-10-CM | POA: Diagnosis present

## 2016-11-13 DIAGNOSIS — R682 Dry mouth, unspecified: Secondary | ICD-10-CM | POA: Diagnosis not present

## 2016-11-13 DIAGNOSIS — S02401S Maxillary fracture, unspecified, sequela: Secondary | ICD-10-CM | POA: Diagnosis not present

## 2016-11-13 DIAGNOSIS — F338 Other recurrent depressive disorders: Secondary | ICD-10-CM | POA: Diagnosis not present

## 2016-11-13 DIAGNOSIS — S52501S Unspecified fracture of the lower end of right radius, sequela: Secondary | ICD-10-CM | POA: Diagnosis not present

## 2016-11-13 DIAGNOSIS — E860 Dehydration: Secondary | ICD-10-CM | POA: Diagnosis present

## 2016-11-13 DIAGNOSIS — R102 Pelvic and perineal pain: Secondary | ICD-10-CM | POA: Diagnosis not present

## 2016-11-13 DIAGNOSIS — Z79899 Other long term (current) drug therapy: Secondary | ICD-10-CM

## 2016-11-13 DIAGNOSIS — D638 Anemia in other chronic diseases classified elsewhere: Secondary | ICD-10-CM | POA: Diagnosis present

## 2016-11-13 DIAGNOSIS — R1310 Dysphagia, unspecified: Secondary | ICD-10-CM | POA: Diagnosis present

## 2016-11-13 DIAGNOSIS — Z515 Encounter for palliative care: Secondary | ICD-10-CM | POA: Diagnosis not present

## 2016-11-13 DIAGNOSIS — S0240CA Maxillary fracture, right side, initial encounter for closed fracture: Secondary | ICD-10-CM | POA: Diagnosis not present

## 2016-11-13 DIAGNOSIS — E039 Hypothyroidism, unspecified: Secondary | ICD-10-CM | POA: Diagnosis present

## 2016-11-13 DIAGNOSIS — R51 Headache: Secondary | ICD-10-CM | POA: Diagnosis not present

## 2016-11-13 DIAGNOSIS — S62644D Nondisplaced fracture of proximal phalanx of right ring finger, subsequent encounter for fracture with routine healing: Secondary | ICD-10-CM | POA: Diagnosis not present

## 2016-11-13 DIAGNOSIS — S61214S Laceration without foreign body of right ring finger without damage to nail, sequela: Secondary | ICD-10-CM | POA: Diagnosis not present

## 2016-11-13 DIAGNOSIS — W19XXXA Unspecified fall, initial encounter: Secondary | ICD-10-CM | POA: Diagnosis present

## 2016-11-13 DIAGNOSIS — S52501A Unspecified fracture of the lower end of right radius, initial encounter for closed fracture: Secondary | ICD-10-CM | POA: Diagnosis present

## 2016-11-13 DIAGNOSIS — Z7401 Bed confinement status: Secondary | ICD-10-CM | POA: Diagnosis not present

## 2016-11-13 DIAGNOSIS — S0281XA Fracture of other specified skull and facial bones, right side, initial encounter for closed fracture: Secondary | ICD-10-CM | POA: Diagnosis not present

## 2016-11-13 DIAGNOSIS — F419 Anxiety disorder, unspecified: Secondary | ICD-10-CM | POA: Diagnosis not present

## 2016-11-13 DIAGNOSIS — S42201S Unspecified fracture of upper end of right humerus, sequela: Secondary | ICD-10-CM | POA: Diagnosis not present

## 2016-11-13 DIAGNOSIS — S0232XA Fracture of orbital floor, left side, initial encounter for closed fracture: Secondary | ICD-10-CM | POA: Diagnosis not present

## 2016-11-13 DIAGNOSIS — I129 Hypertensive chronic kidney disease with stage 1 through stage 4 chronic kidney disease, or unspecified chronic kidney disease: Secondary | ICD-10-CM | POA: Diagnosis present

## 2016-11-13 DIAGNOSIS — E46 Unspecified protein-calorie malnutrition: Secondary | ICD-10-CM | POA: Diagnosis not present

## 2016-11-13 DIAGNOSIS — S0285XA Fracture of orbit, unspecified, initial encounter for closed fracture: Secondary | ICD-10-CM | POA: Diagnosis present

## 2016-11-13 DIAGNOSIS — R52 Pain, unspecified: Secondary | ICD-10-CM | POA: Diagnosis not present

## 2016-11-13 DIAGNOSIS — H04129 Dry eye syndrome of unspecified lacrimal gland: Secondary | ICD-10-CM | POA: Diagnosis not present

## 2016-11-13 DIAGNOSIS — R0602 Shortness of breath: Secondary | ICD-10-CM | POA: Diagnosis not present

## 2016-11-13 DIAGNOSIS — S299XXA Unspecified injury of thorax, initial encounter: Secondary | ICD-10-CM | POA: Diagnosis not present

## 2016-11-13 LAB — CBC WITH DIFFERENTIAL/PLATELET
BASOS ABS: 0.1 10*3/uL (ref 0.0–0.1)
BASOS PCT: 1 %
EOS ABS: 0.7 10*3/uL (ref 0.0–0.7)
EOS PCT: 5 %
HCT: 35.3 % — ABNORMAL LOW (ref 36.0–46.0)
HEMOGLOBIN: 11.9 g/dL — AB (ref 12.0–15.0)
Lymphocytes Relative: 23 %
Lymphs Abs: 3.2 10*3/uL (ref 0.7–4.0)
MCH: 32.6 pg (ref 26.0–34.0)
MCHC: 33.7 g/dL (ref 30.0–36.0)
MCV: 96.7 fL (ref 78.0–100.0)
Monocytes Absolute: 1.1 10*3/uL — ABNORMAL HIGH (ref 0.1–1.0)
Monocytes Relative: 8 %
NEUTROS PCT: 63 %
Neutro Abs: 9 10*3/uL — ABNORMAL HIGH (ref 1.7–7.7)
PLATELETS: 230 10*3/uL (ref 150–400)
RBC: 3.65 MIL/uL — AB (ref 3.87–5.11)
RDW: 13.3 % (ref 11.5–15.5)
WBC: 14 10*3/uL — AB (ref 4.0–10.5)

## 2016-11-13 LAB — BASIC METABOLIC PANEL
Anion gap: 8 (ref 5–15)
BUN: 30 mg/dL — AB (ref 6–20)
CALCIUM: 9.2 mg/dL (ref 8.9–10.3)
CHLORIDE: 102 mmol/L (ref 101–111)
CO2: 24 mmol/L (ref 22–32)
CREATININE: 1.72 mg/dL — AB (ref 0.44–1.00)
GFR, EST AFRICAN AMERICAN: 28 mL/min — AB (ref >60)
GFR, EST NON AFRICAN AMERICAN: 24 mL/min — AB (ref >60)
Glucose, Bld: 110 mg/dL — ABNORMAL HIGH (ref 65–99)
Potassium: 4.5 mmol/L (ref 3.5–5.1)
SODIUM: 134 mmol/L — AB (ref 135–145)

## 2016-11-13 LAB — URINALYSIS, ROUTINE W REFLEX MICROSCOPIC
Bilirubin Urine: NEGATIVE
Glucose, UA: NEGATIVE mg/dL
HGB URINE DIPSTICK: NEGATIVE
Ketones, ur: NEGATIVE mg/dL
LEUKOCYTES UA: NEGATIVE
Nitrite: NEGATIVE
Protein, ur: NEGATIVE mg/dL
Specific Gravity, Urine: 1.017 (ref 1.005–1.030)
pH: 6 (ref 5.0–8.0)

## 2016-11-13 LAB — MRSA PCR SCREENING: MRSA BY PCR: NEGATIVE

## 2016-11-13 MED ORDER — ESCITALOPRAM OXALATE 10 MG PO TABS
10.0000 mg | ORAL_TABLET | Freq: Every day | ORAL | Status: DC
  Administered 2016-11-14 – 2016-11-20 (×7): 10 mg via ORAL
  Filled 2016-11-13 (×7): qty 1

## 2016-11-13 MED ORDER — DONEPEZIL HCL 5 MG PO TABS
10.0000 mg | ORAL_TABLET | Freq: Every day | ORAL | Status: DC
  Administered 2016-11-14 – 2016-11-20 (×7): 10 mg via ORAL
  Filled 2016-11-13 (×7): qty 2

## 2016-11-13 MED ORDER — LORATADINE 10 MG PO TABS
10.0000 mg | ORAL_TABLET | Freq: Every day | ORAL | Status: DC
  Administered 2016-11-14 – 2016-11-20 (×7): 10 mg via ORAL
  Filled 2016-11-13 (×7): qty 1

## 2016-11-13 MED ORDER — SULFAMETHOXAZOLE-TRIMETHOPRIM 800-160 MG PO TABS
1.0000 | ORAL_TABLET | Freq: Two times a day (BID) | ORAL | Status: AC
  Administered 2016-11-13 – 2016-11-15 (×3): 1 via ORAL
  Filled 2016-11-13 (×3): qty 1

## 2016-11-13 MED ORDER — ACETAMINOPHEN 650 MG RE SUPP: 650.0000 mg | Freq: Four times a day (QID) | RECTAL | Status: DC | PRN

## 2016-11-13 MED ORDER — ACETAMINOPHEN 325 MG PO TABS
650.0000 mg | ORAL_TABLET | Freq: Four times a day (QID) | ORAL | Status: DC | PRN
  Administered 2016-11-15 – 2016-11-16 (×2): 650 mg via ORAL
  Filled 2016-11-13 (×2): qty 2

## 2016-11-13 MED ORDER — TETANUS-DIPHTH-ACELL PERTUSSIS 5-2.5-18.5 LF-MCG/0.5 IM SUSP
0.5000 mL | Freq: Once | INTRAMUSCULAR | Status: AC
  Administered 2016-11-13: 0.5 mL via INTRAMUSCULAR
  Filled 2016-11-13: qty 0.5

## 2016-11-13 MED ORDER — HEPARIN SODIUM (PORCINE) 5000 UNIT/ML IJ SOLN: 5000.0000 [IU] | Freq: Three times a day (TID) | INTRAMUSCULAR | Status: DC

## 2016-11-13 MED ORDER — TETANUS-DIPHTH-ACELL PERTUSSIS 5-2.5-18.5 LF-MCG/0.5 IM SUSP
INTRAMUSCULAR | Status: AC
  Filled 2016-11-13: qty 0.5

## 2016-11-13 MED ORDER — HEPARIN SODIUM (PORCINE) 5000 UNIT/ML IJ SOLN
5000.0000 [IU] | Freq: Three times a day (TID) | INTRAMUSCULAR | Status: DC
  Administered 2016-11-13 – 2016-11-21 (×21): 5000 [IU] via SUBCUTANEOUS
  Filled 2016-11-13 (×23): qty 1

## 2016-11-13 MED ORDER — ORAL CARE MOUTH RINSE
15.0000 mL | Freq: Two times a day (BID) | OROMUCOSAL | Status: DC
  Administered 2016-11-13 – 2016-11-22 (×15): 15 mL via OROMUCOSAL

## 2016-11-13 MED ORDER — ONDANSETRON HCL 4 MG/2ML IJ SOLN: 4.0000 mg | Freq: Four times a day (QID) | INTRAMUSCULAR | Status: DC | PRN

## 2016-11-13 MED ORDER — MEMANTINE HCL 10 MG PO TABS
10.0000 mg | ORAL_TABLET | Freq: Two times a day (BID) | ORAL | Status: DC
  Administered 2016-11-13 – 2016-11-20 (×14): 10 mg via ORAL
  Filled 2016-11-13 (×14): qty 1

## 2016-11-13 MED ORDER — SODIUM CHLORIDE 0.9 % IV BOLUS (SEPSIS)
1000.0000 mL | Freq: Once | INTRAVENOUS | Status: AC
  Administered 2016-11-13: 1000 mL via INTRAVENOUS

## 2016-11-13 MED ORDER — MORPHINE SULFATE (PF) 2 MG/ML IV SOLN
1.0000 mg | INTRAVENOUS | Status: DC | PRN
  Administered 2016-11-13: 2 mg via INTRAVENOUS
  Administered 2016-11-13 (×2): 1 mg via INTRAVENOUS
  Administered 2016-11-14 – 2016-11-15 (×6): 2 mg via INTRAVENOUS
  Filled 2016-11-13 (×9): qty 1

## 2016-11-13 MED ORDER — ONDANSETRON HCL 4 MG PO TABS: 4.0000 mg | ORAL_TABLET | Freq: Four times a day (QID) | ORAL | Status: DC | PRN

## 2016-11-13 MED ORDER — LOSARTAN POTASSIUM 50 MG PO TABS
50.0000 mg | ORAL_TABLET | Freq: Every day | ORAL | Status: DC
  Administered 2016-11-14 – 2016-11-21 (×8): 50 mg via ORAL
  Filled 2016-11-13 (×9): qty 1

## 2016-11-13 MED ORDER — LIDOCAINE HCL (PF) 1 % IJ SOLN
INTRAMUSCULAR | Status: AC
  Administered 2016-11-13: 10:00:00
  Filled 2016-11-13: qty 5

## 2016-11-13 MED ORDER — LOPERAMIDE HCL 2 MG PO CAPS
2.0000 mg | ORAL_CAPSULE | Freq: Two times a day (BID) | ORAL | Status: DC
  Administered 2016-11-13 – 2016-11-20 (×13): 2 mg via ORAL
  Filled 2016-11-13 (×31): qty 1

## 2016-11-13 MED ORDER — FENTANYL CITRATE (PF) 100 MCG/2ML IJ SOLN
50.0000 ug | Freq: Once | INTRAMUSCULAR | Status: AC
  Administered 2016-11-13: 50 ug via INTRAVENOUS
  Filled 2016-11-13: qty 2

## 2016-11-13 MED ORDER — SODIUM CHLORIDE 0.9 % IV SOLN
INTRAVENOUS | Status: DC
  Administered 2016-11-13 – 2016-11-14 (×3): via INTRAVENOUS

## 2016-11-13 MED ORDER — LEVOTHYROXINE SODIUM 25 MCG PO TABS
125.0000 ug | ORAL_TABLET | Freq: Every day | ORAL | Status: DC
  Administered 2016-11-14 – 2016-11-21 (×8): 125 ug via ORAL
  Filled 2016-11-13 (×9): qty 1
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
## 2016-11-13 NOTE — H&P (Addendum)
History and Physical    JAVAEH MUSCATELLO ZOX:[id number] DOB: 1919-10-13 DOA: 11/13/2016  PCP: [name], MD  Patient coming from: [facility]  I have personally briefly reviewed patient's old medical records in [facility]  Chief Complaint: fall  HPI: PAYZLEE RYDER is a 83 y.o. female with medical history significant of chronic kidney disease, dementia, hypertension and hypothyroidism. Patient is a resident of assisted living facility. She was brought to the hospital today after being found on the floor. Patient apparently had fallen out of bed and was screaming in pain when staff found her on floor. Prior to her fall, she was reportedly in her normal state of health. She was recently diagnosed with a urinary tract infection and was started on Bactrim. She has another 2 days of treatment. She did not been having any fevers . Oral intake had been normal for her.   ED Course: Vitals are noted to be stable in the emergency room. Lab work indicated elevated creatinine consistent with acute kidney injury. She had extensive imaging done showing several injuries including right open fracture, right maxillary fractures, right radial fracture, left humerus fracture. She is referred for admission.  Review of Systems: unable to assess due to mental status    Past Medical History:  Diagnosis Date  . Anemia   . CKD (chronic kidney disease) stage 3, GFR 30-59 ml/min 06/07/2015  . Dementia   . Escherichia coli urinary tract infection 02/16/16   Cipro  . Gastric ulcer    gi bleed secondary  . HCAP (healthcare-associated pneumonia) 06/06/2015  . History of fracture of right hip 05/21/2012   ORIF 05/13/12   . History of subdural hematoma (post traumatic) 05/21/2012   Dx on MR I in September 2013   . Hypertension   . Hypothyroidism   . Macular degeneration   . Vertigo     Past Surgical History:  Procedure Laterality Date  . ABDOMINAL HYSTERECTOMY    . CARPAL TUNNEL RELEASE    . FEMUR  IM NAIL  05/13/2012   Procedure: INTRAMEDULLARY (IM) NAIL FEMORAL;  Surgeon: Senaida Lange, MD;  Location: [facility] OR;  Service: Orthopedics;  Laterality: Right;  . FRACTURE SURGERY    . MASTECTOMY    . THYROID SURGERY       reports that she has never smoked. She has never used smokeless tobacco. She reports that she does not drink alcohol or use drugs.  Allergies  Allergen Reactions  . Amoxicillin-Pot Clavulanate     REACTION: UNKNOWN REACTION  . Cephalexin     REACTION: UNKNOWN REACTION  . Nsaids     UNKNOWN REACTION    Family History  Problem Relation Age of Onset  . Thyroid disease Neg Hx   . Cancer Neg Hx   . Diabetes Neg Hx   . Heart disease Neg Hx   . Stroke Neg Hx     Prior to Admission medications   Medication Sig Start Date End Date Taking? Authorizing Provider  acetaminophen (TYLENOL) 500 MG tablet Take 500 mg by mouth at bedtime. For pain management   Yes Historical Provider, MD  donepezil (ARICEPT) 10 MG tablet Take 10 mg by mouth daily.    Yes Historical Provider, MD  escitalopram (LEXAPRO) 10 MG tablet Take 10 mg by mouth daily.   Yes Historical Provider, MD  levothyroxine (SYNTHROID, LEVOTHROID) 125 MCG tablet Take 125 mcg by mouth daily before breakfast.   Yes Historical Provider, MD  loperamide (IMODIUM A-D) 2  MG tablet Take 2 mg by mouth 2 (two) times daily.   Yes Historical Provider, MD  loratadine (CLARITIN) 10 MG tablet Take 10 mg by mouth daily.   Yes Historical Provider, MD  losartan (COZAAR) 50 MG tablet Take 50 mg by mouth daily.   Yes Historical Provider, MD  Lutein 6 MG CAPS Take 1 capsule by mouth daily.   Yes Historical Provider, MD  memantine (NAMENDA) 10 MG tablet Take 10 mg by mouth 2 (two) times daily.   Yes Historical Provider, MD  miconazole (BAZA ANTIFUNGAL) 2 % cream Apply 1 application topically 2 (two) times daily. To buttocks for redness   Yes Historical Provider, MD  Multiple Vitamin (DAILY VITE) TABS Take 1 tablet by mouth daily.   Yes  Historical Provider, MD  Polyethyl Glycol-Propyl Glycol (SYSTANE OP) Apply 1 drop to eye 4 (four) times daily.   Yes Historical Provider, MD  sulfamethoxazole-trimethoprim (BACTRIM DS,SEPTRA DS) 800-160 MG tablet Take 1 tablet by mouth 2 (two) times daily. For 5 days to treat UTI. 11/10/16 11/15/16 Yes Historical Provider, MD    Physical Exam: Vitals:   11/13/16 1030 11/13/16 1100 11/13/16 1130 11/13/16 1227  BP: 120/88 140/77 (!) 150/72 (!) 144/85  Pulse: 79 73 80 86  Resp: 20 17 18 18   Temp:    98.7 F (37.1 C)  TempSrc:    Oral  SpO2: 96% 94% 92% 96%  Weight:    46.4 kg (102 lb 4.8 oz)  Height:    5\' 2"  (1.575 m)    Constitutional: NAD, calm, comfortable Vitals:   11/13/16 1030 11/13/16 1100 11/13/16 1130 11/13/16 1227  BP: 120/88 140/77 (!) 150/72 (!) 144/85  Pulse: 79 73 80 86  Resp: 20 17 18 18   Temp:    98.7 F (37.1 C)  TempSrc:    Oral  SpO2: 96% 94% 92% 96%  Weight:    46.4 kg (102 lb 4.8 oz)  Height:    5\' 2"  (1.575 m)   Eyes: PERRL, right conjunctiva is bloody ENMT: Mucous membranes are dry. Dried blood noted in mouth.Normal dentition.  Neck: normal, supple, no masses, no thyromegaly Respiratory: clear to auscultation bilaterally, no wheezing, no crackles. Normal respiratory effort. No accessory muscle use.  Cardiovascular: Regular rate and rhythm, no murmurs / rubs / gallops. No extremity edema. 2+ pedal pulses. No carotid bruits.  Abdomen: no tenderness, no masses palpated. No hepatosplenomegaly. Bowel sounds positive.  Musculoskeletal: no clubbing / cyanosis. Left arm in sling, write forearm in spint.  Skin: bruising noted on right side of face, swelling on right side of face Neurologic: CN 2-12 grossly intact. Sensation intact, DTR normal. Strength 5/5 in all 4.  Psychiatric: confused.   Labs on Admission: I have personally reviewed following labs and imaging studies  CBC:  Recent Labs Lab 11/13/16 0705  WBC 14.0*  NEUTROABS 9.0*  HGB 11.9*  HCT  35.3*  MCV 96.7  PLT 230   Basic Metabolic Panel:  Recent Labs Lab 11/13/16 0705  NA 134*  K 4.5  CL 102  CO2 24  GLUCOSE 110*  BUN 30*  CREATININE 1.72*  CALCIUM 9.2   GFR: Estimated Creatinine Clearance: 14.3 mL/min (A) (by C-G formula based on SCr of 1.72 mg/dL (H)). Liver Function Tests: No results for input(s): AST, ALT, ALKPHOS, BILITOT, PROT, ALBUMIN in the last 168 hours. No results for input(s): LIPASE, AMYLASE in the last 168 hours. No results for input(s): AMMONIA in the last 168 hours. Coagulation Profile: No  results for input(s): INR, PROTIME in the last 168 hours. Cardiac Enzymes: No results for input(s): CKTOTAL, CKMB, CKMBINDEX, TROPONINI in the last 168 hours. BNP (last 3 results) No results for input(s): PROBNP in the last 8760 hours. HbA1C: No results for input(s): HGBA1C in the last 72 hours. CBG: No results for input(s): GLUCAP in the last 168 hours. Lipid Profile: No results for input(s): CHOL, HDL, LDLCALC, TRIG, CHOLHDL, LDLDIRECT in the last 72 hours. Thyroid Function Tests: No results for input(s): TSH, T4TOTAL, FREET4, T3FREE, THYROIDAB in the last 72 hours. Anemia Panel: No results for input(s): VITAMINB12, FOLATE, FERRITIN, TIBC, IRON, RETICCTPCT in the last 72 hours. Urine analysis:    Component Value Date/Time   COLORURINE YELLOW 11/13/2016 0823   APPEARANCEUR CLEAR 11/13/2016 0823   LABSPEC 1.017 11/13/2016 0823   PHURINE 6.0 11/13/2016 0823   GLUCOSEU NEGATIVE 11/13/2016 0823   HGBUR NEGATIVE 11/13/2016 0823   BILIRUBINUR NEGATIVE 11/13/2016 0823   KETONESUR NEGATIVE 11/13/2016 0823   PROTEINUR NEGATIVE 11/13/2016 0823   UROBILINOGEN 0.2 06/21/2015 0109   NITRITE NEGATIVE 11/13/2016 0823   LEUKOCYTESUR NEGATIVE 11/13/2016 0823    Radiological Exams on Admission: Ct Abdomen Pelvis Wo Contrast  Result Date: 11/13/2016 CLINICAL DATA:  Found on floor. Complains of right facial pain with bruising and swelling. Left arm pain.  EXAM: CT CHEST, ABDOMEN AND PELVIS WITHOUT CONTRAST TECHNIQUE: Multidetector CT imaging of the chest, abdomen and pelvis was performed following the standard protocol without IV contrast. COMPARISON:  Chest radiograph 11/13/2016. CT pelvis 02/16/2016. Lumbar spine 02/12/2016 FINDINGS: CT CHEST FINDINGS Cardiovascular: Calcifications of the aortic root. Calcifications at the aortic arch. Caliber of the thoracic aorta is within normal limits. Mediastinum/Nodes: No significant chest lymphadenopathy. There is minimal pericardial fluid. Lungs/Pleura: Trachea and mainstem bronchi are patent. 1.1 x 0.5 cm nodule structure in the right upper lobe on sequence 3, image 49. Small calcified granuloma in the right upper lobe on image 43. Mild scarring at the lung apices. No large areas of airspace disease or lung consolidation. Negative for pneumothorax. Musculoskeletal: Significant degenerative changes and joint space narrowing in the right shoulder joint. There is a large bursal fluid collection around the right shoulder. Displaced and comminuted fracture of the proximal left humerus. Evidence for prior left mastectomy. CT ABDOMEN PELVIS FINDINGS Hepatobiliary: Gallbladder is mildly distended with evidence of stones or sludge. No gross abnormality to the liver on this noncontrast examination. Pancreas: Limited evaluation of pancreas without gross abnormality. Spleen: Normal appearance of spleen without enlargement. Adrenals/Urinary Tract: Adrenal glands not well visualized. There is quite a bit of motion artifact on this examination. There are least 2 small hyperdense structures involving the left kidney, largest measuring 0.8 cm in the anterior left interpolar region. Negative for hydronephrosis. Stomach/Bowel: The rectum is distended with a large amount stool. Normal caliber of the small bowel and stomach. No evidence for bowel obstruction. Vascular/Lymphatic: Atherosclerotic calcifications of the abdominal aorta without  aneurysm. No significant lymph node enlargement in the abdomen or pelvis. Reproductive: Status post hysterectomy. No adnexal masses. Other: No free fluid.  No free air. Musculoskeletal: Surgical fixation in the right hip. Old fracture involving the left inferior pubic ramus. There is a severe compression fracture of the L2 vertebral body with approximately 6 mm of bone retropulsion. This fracture is new from an exam on 02/12/2016. There is approximately 60-70% loss of vertebral body height at L2. Mild disc space narrowing at L4-L5. IMPRESSION: Comminuted and displaced fracture involving the proximal left humerus. Compression fracture  of L2 with bone retropulsion. This is new from the previous examinations and this fracture is age indeterminate. No acute intrathoracic or intra-abdominal abnormalities. Limited evaluation of the intra-abdominal structures due to motion and the lack of intravenous contrast. Indeterminate 1.1 cm nodular lesion in the right upper lung. Neoplastic process cannot be excluded. This nodule could be further evaluated with a PET-CT if a neoplastic workup is desired. Hyperdense structures in left kidney. These could represent hemorrhagic or proteinaceous cyst but indeterminate. Left amount of fluid surrounding the right shoulder probably related to a bursal fluid collection. Electronically Signed   By: Richarda Overlie M.D.   On: 11/13/2016 09:08   Dg Chest 1 View  Result Date: 11/13/2016 CLINICAL DATA:  Patient found down at nursing home. EXAM: CHEST 1 VIEW COMPARISON:  Chest radiograph 06/06/2015 FINDINGS: Monitoring leads overlie the patient. Stable enlarged cardiac and mediastinal contours with tortuosity and calcification of the thoracic aorta. No large area of pulmonary consolidation. No pleural effusion or pneumothorax. Displaced proximal left humeral diaphysis fracture. IMPRESSION: No acute cardiopulmonary process. Proximal left humerus diaphyseal fracture. See dedicated radiographs.  Electronically Signed   By: [name] Belt M.D.   On: 11/13/2016 08:09   Dg Pelvis 1-2 Views  Result Date: 11/13/2016 CLINICAL DATA:  Unwitnessed fall with pain EXAM: PELVIS - 1-2 VIEW COMPARISON:  February 17, 2016 FINDINGS: There is screw and plate fixation in the proximal right femur with alignment in these areas is anatomic. The tip of the screw is in the proximal femoral head. There is no evident acute fracture or dislocation. There is stable moderate narrowing of both hip joints. No erosive change. Bones are diffusely osteoporotic. There is aortic atherosclerosis. IMPRESSION: Postoperative change right femur. No acute fracture or dislocation. Bones osteoporotic. Symmetric narrowing both hip joints, stable. Aortic atherosclerosis. Electronically Signed   By: Bretta Bang III M.D.   On: 11/13/2016 08:08   Dg Wrist Complete Right  Result Date: 11/13/2016 CLINICAL DATA:  Pain following fall EXAM: RIGHT WRIST - COMPLETE 3+ VIEW COMPARISON:  None. FINDINGS: Frontal, oblique, and lateral views were obtained. There is an impacted fracture of the distal radial metaphysis. There is evidence of what appears to be an old fracture of the distal scaphoid bone with remodeling. No other fractures are evident. There appears to be dislocation at the scaphotrapezial joint. There is evidence of what appears to be chronic scapholunate disassociation. There is advanced osteoarthritic change in the lateral wrist region. Bones are osteoporotic. There is extensive chondrocalcinosis. IMPRESSION: Acute fracture of the right distal radial metaphysis with impaction at the fracture site. Evidence of old fracture of the distal scaphoid with remodeling in this area. Apparent scaphotrapezial joint dislocation, likely chronic. 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 Advanced arthropathy in the lateral aspect of the wrist joint. Extensive chondrocalcinosis which may be due to either osteoarthritis or calcium pyrophosphate deposition  disease. Bones diffusely osteoporotic. Electronically Signed   By: Bretta Bang III M.D.   On: 11/13/2016 08:13   Ct Head Wo Contrast  Result Date: 11/13/2016 CLINICAL DATA:  Pain following fall EXAM: CT HEAD WITHOUT CONTRAST CT MAXILLOFACIAL WITHOUT CONTRAST CT CERVICAL SPINE WITHOUT CONTRAST TECHNIQUE: Multidetector CT imaging of the head, cervical spine, and maxillofacial structures were performed using the standard protocol without intravenous contrast. Multiplanar CT image reconstructions of the cervical spine and maxillofacial structures were also generated. COMPARISON:  Head CT and cervical spine CT February 16, 2016 FINDINGS: CT HEAD FINDINGS Brain: There is moderate diffuse atrophy. There is no intracranial  mass, hemorrhage, extra-axial fluid collection, or midline shift. There is patchy small vessel disease in the centra semiovale bilaterally. There is mild small vessel disease in each thalamus. There is no new gray-white compartment lesion. No acute infarct evident. Vascular: No hyperdense vessel. There is calcification in both carotid siphon regions. Skull: Bony calvarium appears intact. Other: Mastoid air cells are clear. CT MAXILLOFACIAL FINDINGS Osseous: There is a comminuted fracture of the lateral orbital wall on the right. There is a comminuted orbital floor fracture on the right with depressed fracture fragments. There does not appear to be extraocular muscle entrapment from this depressed orbital floor fracture. There is a comminuted anterior maxillary wall fracture on the right with fracture fragments depressed posteriorly into the maxillary antral region. There is a displaced fracture along the medial maxillary antrum on the right with a posterior fragment lateral to the anterior fragment. The pterygoid plate on the right remains intact. However, superior to the pterygoid plate on the right, there is a mildly displaced fracture of the posterior wall of the maxillary antrum on the right.  There is a fracture through the midportion of the zygomatic arch on the right. No fractures to the left of midline are demonstrable. There is advanced arthropathy in both temporomandibular joints with erosion of the each mandibular condyle. Orbits: There is soft tissue swelling in the preseptal right orbital region. There is no intraorbital lesion beyond a small amount of air that has tracked from the right maxillary antrum into the inferior right orbit. There is no hemorrhage within either orbit. Sinuses: There is complete opacification of the right maxillary antrum, likely hemorrhage. Hemorrhage tracks into the right near ease, likely through a fracture of the medial maxillary sinus wall on the right. There is obstruction of the ostiomeatal unit complex on the right. There is opacification in several ethmoid air cells bilaterally, more on the right than on the left. There is an air-fluid level in a posterior right ethmoid air cell. Frontal sinuses are clear. There is hemorrhage in the right sphenoid sinus complex with air-fluid level. No appreciable mucosal thickening seen in this area. Soft tissues: There is marked soft tissue swelling over the entire right face with areas of developing hematoma throughout the right facial region. Air tracks into the soft tissues on the right via the complex maxillary sinus region fractures. More inferiorly, soft tissues appear normal. No abscess. Salivary glands appear normal. Visualize pharynx appears unremarkable. No tongue or tongue base lesions evident. CT CERVICAL SPINE FINDINGS Alignment: There is levoscoliosis. There is 2 mm of anterolisthesis of C4 on C5. There is 2 mm of C5 on C6. There is 3 mm of anterolisthesis of C6 on C7. There is 2 mm of anterolisthesis of T1 on T2. Skull base and vertebrae: The skull base and craniocervical junction regions appear normal. There is no appreciable fracture. There are no blastic or lytic bone lesions. There is osteoarthritic change  with cystic lesions in the odontoid. No impending fracture from this cystic change evident. Soft tissues and spinal canal: Prevertebral soft tissues and predental space regions are normal. No paraspinous lesions. No cord or canal hematoma is demonstrable on this study. No spinal stenosis evident. Disc levels: There is moderately severe disc space narrowing at C3-4, C5-6, C6-7, C7-T1. There is moderate disc space narrowing at C4-5. There is facet hypertrophy at multiple levels. No disc extrusion. Upper chest: Visualized lung apices are clear. Other: There is atherosclerotic calcification in each carotid artery. IMPRESSION: CT head: Chronic atrophy  with supratentorial small vessel disease, stable. No intracranial mass, hemorrhage, or extra-axial fluid collection. No acute infarct evident. No calvarial fracture. CT maxillofacial: There are fractures of the right orbital wall and right orbital floor. There are fractures of the right anterior, medial, lateral, and posterior maxillary antral walls with displaced fracture fragments in all of these regions. The pterygoid plate on the right remains intact. Posterior maxillary antral wall fracture is superior to the pterygoid plate. There is a mildly displaced fracture of the midportion of the zygomatic arch. The posterior aspect of the right zygomatic arch is displaced mildly laterally compared to the anterior portion. No fractures to the left of midline apparent. Extensive right-sided paranasal sinus disease is noted, most pronounced in the right maxillary antral region. Air tracks from the right maxillary antrum throughout the surrounding face and inferior orbital regions. 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 CT cervical spine: Scoliosis with mild spondylolisthesis at several levels, felt to be due to underlying spondylosis. No fracture. Multilevel osteoarthritic change. Bilateral carotid artery calcification noted. Electronically  Signed   By: Bretta Bang III M.D.   On: 11/13/2016 08:38   Ct Chest Wo Contrast  Result Date: 11/13/2016 CLINICAL DATA:  Found on floor. Complains of right facial pain with bruising and swelling. Left arm pain. EXAM: CT CHEST, ABDOMEN AND PELVIS WITHOUT CONTRAST TECHNIQUE: Multidetector CT imaging of the chest, abdomen and pelvis was performed following the standard protocol without IV contrast. COMPARISON:  Chest radiograph 11/13/2016. CT pelvis 02/16/2016. Lumbar spine 02/12/2016 FINDINGS: CT CHEST FINDINGS Cardiovascular: Calcifications of the aortic root. Calcifications at the aortic arch. Caliber of the thoracic aorta is within normal limits. Mediastinum/Nodes: No significant chest lymphadenopathy. There is minimal pericardial fluid. Lungs/Pleura: Trachea and mainstem bronchi are patent. 1.1 x 0.5 cm nodule structure in the right upper lobe on sequence 3, image 49. Small calcified granuloma in the right upper lobe on image 43. Mild scarring at the lung apices. No large areas of airspace disease or lung consolidation. Negative for pneumothorax. Musculoskeletal: Significant degenerative changes and joint space narrowing in the right shoulder joint. There is a large bursal fluid collection around the right shoulder. Displaced and comminuted fracture of the proximal left humerus. Evidence for prior left mastectomy. CT ABDOMEN PELVIS FINDINGS Hepatobiliary: Gallbladder is mildly distended with evidence of stones or sludge. No gross abnormality to the liver on this noncontrast examination. Pancreas: Limited evaluation of pancreas without gross abnormality. Spleen: Normal appearance of spleen without enlargement. Adrenals/Urinary Tract: Adrenal glands not well visualized. There is quite a bit of motion artifact on this examination. There are least 2 small hyperdense structures involving the left kidney, largest measuring 0.8 cm in the anterior left interpolar region. Negative for hydronephrosis.  Stomach/Bowel: The rectum is distended with a large amount stool. Normal caliber of the small bowel and stomach. No evidence for bowel obstruction. Vascular/Lymphatic: Atherosclerotic calcifications of the abdominal aorta without aneurysm. No significant lymph node enlargement in the abdomen or pelvis. Reproductive: Status post hysterectomy. No adnexal masses. Other: No free fluid.  No free air. Musculoskeletal: Surgical fixation in the right hip. Old fracture involving the left inferior pubic ramus. There is a severe compression fracture of the L2 vertebral body with approximately 6 mm of bone retropulsion. This fracture is new from an exam on 02/12/2016. There is approximately 60-70% loss of vertebral body height at L2. Mild disc space narrowing at L4-L5. IMPRESSION: Comminuted and displaced fracture involving the proximal left humerus. Compression fracture of L2  with bone retropulsion. This is new from the previous examinations and this fracture is age indeterminate. No acute intrathoracic or intra-abdominal abnormalities. Limited evaluation of the intra-abdominal structures due to motion and the lack of intravenous contrast. Indeterminate 1.1 cm nodular lesion in the right upper lung. Neoplastic process cannot be excluded. This nodule could be further evaluated with a PET-CT if a neoplastic workup is desired. Hyperdense structures in left kidney. These could represent hemorrhagic or proteinaceous cyst but indeterminate. Left amount of fluid surrounding the right shoulder probably related to a bursal fluid collection. Electronically Signed   By: Richarda OverlieAdam  Henn M.D.   On: 11/13/2016 09:08   Ct Cervical Spine Wo Contrast  Result Date: 11/13/2016 CLINICAL DATA:  Pain following fall EXAM: CT HEAD WITHOUT CONTRAST CT MAXILLOFACIAL WITHOUT CONTRAST CT CERVICAL SPINE WITHOUT CONTRAST TECHNIQUE: Multidetector CT imaging of the head, cervical spine, and maxillofacial structures were performed using the standard protocol  without intravenous contrast. Multiplanar CT image reconstructions of the cervical spine and maxillofacial structures were also generated. COMPARISON:  Head CT and cervical spine CT February 16, 2016 FINDINGS: CT HEAD FINDINGS Brain: There is moderate diffuse atrophy. There is no intracranial mass, hemorrhage, extra-axial fluid collection, or midline shift. There is patchy small vessel disease in the centra semiovale bilaterally. There is mild small vessel disease in each thalamus. There is no new gray-white compartment lesion. No acute infarct evident. Vascular: No hyperdense vessel. There is calcification in both carotid siphon regions. Skull: Bony calvarium appears intact. Other: Mastoid air cells are clear. CT MAXILLOFACIAL FINDINGS Osseous: There is a comminuted fracture of the lateral orbital wall on the right. There is a comminuted orbital floor fracture on the right with depressed fracture fragments. There does not appear to be extraocular muscle entrapment from this depressed orbital floor fracture. There is a comminuted anterior maxillary wall fracture on the right with fracture fragments depressed posteriorly into the maxillary antral region. There is a displaced fracture along the medial maxillary antrum on the right with a posterior fragment lateral to the anterior fragment. The pterygoid plate on the right remains intact. However, superior to the pterygoid plate on the right, there is a mildly displaced fracture of the posterior wall of the maxillary antrum on the right. There is a fracture through the midportion of the zygomatic arch on the right. No fractures to the left of midline are demonstrable. There is advanced arthropathy in both temporomandibular joints with erosion of the each mandibular condyle. Orbits: There is soft tissue swelling in the preseptal right orbital region. There is no intraorbital lesion beyond a small amount of air that has tracked from the right maxillary antrum into the  inferior right orbit. There is no hemorrhage within either orbit. Sinuses: There is complete opacification of the right maxillary antrum, likely hemorrhage. Hemorrhage tracks into the right near ease, likely through a fracture of the medial maxillary sinus wall on the right. There is obstruction of the ostiomeatal unit complex on the right. There is opacification in several ethmoid air cells bilaterally, more on the right than on the left. There is an air-fluid level in a posterior right ethmoid air cell. Frontal sinuses are clear. There is hemorrhage in the right sphenoid sinus complex with air-fluid level. No appreciable mucosal thickening seen in this area. Soft tissues: There is marked soft tissue swelling over the entire right face with areas of developing hematoma throughout the right facial region. Air tracks into the soft tissues on the right via the complex  maxillary sinus region fractures. More inferiorly, soft tissues appear normal. No abscess. Salivary glands appear normal. Visualize pharynx appears unremarkable. No tongue or tongue base lesions evident. CT CERVICAL SPINE FINDINGS Alignment: There is levoscoliosis. There is 2 mm of anterolisthesis of C4 on C5. There is 2 mm of C5 on C6. There is 3 mm of anterolisthesis of C6 on C7. There is 2 mm of anterolisthesis of T1 on T2. Skull base and vertebrae: The skull base and craniocervical junction regions appear normal. There is no appreciable fracture. There are no blastic or lytic bone lesions. There is osteoarthritic change with cystic lesions in the odontoid. No impending fracture from this cystic change evident. Soft tissues and spinal canal: Prevertebral soft tissues and predental space regions are normal. No paraspinous lesions. No cord or canal hematoma is demonstrable on this study. No spinal stenosis evident. Disc levels: There is moderately severe disc space narrowing at C3-4, C5-6, C6-7, C7-T1. There is moderate disc space narrowing at C4-5.  There is facet hypertrophy at multiple levels. No disc extrusion. Upper chest: Visualized lung apices are clear. Other: There is atherosclerotic calcification in each carotid artery. IMPRESSION: CT head: Chronic atrophy with supratentorial small vessel disease, stable. No intracranial mass, hemorrhage, or extra-axial fluid collection. No acute infarct evident. No calvarial fracture. CT maxillofacial: There are fractures of the right orbital wall and right orbital floor. There are fractures of the right anterior, medial, lateral, and posterior maxillary antral walls with displaced fracture fragments in all of these regions. The pterygoid plate on the right remains intact. Posterior maxillary antral wall fracture is superior to the pterygoid plate. There is a mildly displaced fracture of the midportion of the zygomatic arch. The posterior aspect of the right zygomatic arch is displaced mildly laterally compared to the anterior portion. No fractures to the left of midline apparent. Extensive right-sided paranasal sinus disease is noted, most pronounced in the right maxillary antral region. Air tracks from the right maxillary antrum throughout the surrounding face and inferior orbital regions. 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 CT cervical spine: Scoliosis with mild spondylolisthesis at several levels, felt to be due to underlying spondylosis. No fracture. Multilevel osteoarthritic change. Bilateral carotid artery calcification noted. Electronically Signed   By: Bretta Bang III M.D.   On: 11/13/2016 08:38   Dg Shoulder Left  Result Date: 11/13/2016 CLINICAL DATA:  Pain following fall EXAM: LEFT SHOULDER - 2+ VIEW COMPARISON:  None. FINDINGS: Frontal and Y scapular images were obtained. There is a fracture of the proximal left humerus extending from the head region into the proximal diaphysis laterally with separation of fracture fragments. There is advanced avascular  necrosis in the humeral head with severe erosion of the humeral head. There is no frank dislocation, although extensive remodeling in the left shoulder joint is noted. Bones are diffusely osteoporotic. There is separation of the acromioclavicular joint. Visualized left lung is clear. There is aortic atherosclerosis. IMPRESSION: Fracture proximal humerus with displacement of fracture fragments. No frank dislocation. 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 Acromioclavicular separation, likely chronic. Bones diffusely osteoporotic. There is aortic atherosclerosis. Electronically Signed   By: Bretta Bang III M.D.   On: 11/13/2016 08:09   Dg Hand Complete Right  Result Date: 11/13/2016 CLINICAL DATA:  Pain following fall EXAM: RIGHT HAND - COMPLETE 3+ VIEW COMPARISON:  None. FINDINGS: Frontal, oblique, and lateral views obtained. There is an impacted fracture of the distal radial metaphysis.  There is a nondisplaced fracture of the proximal portion of the fourth proximal phalanx. There is evidence of an old fracture of the scaphoid bone with scaphotrapezial joint dislocation, felt to be chronic. There is age uncertain dislocation at the second and third MCP joints. 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 There is erosive osteoarthritis in the fourth PIP joint. There is osteoarthritis in all MCP, PIP, and DIP joints. There is advanced osteoporosis. There is extensive chondrocalcinosis in the wrist region proximally. IMPRESSION: Impacted fracture distal radial metaphysis. Nondisplaced fracture proximal aspect fourth proximal phalanx. Old appearing fracture distal scaphoid with chronic appearing scaphotrapezial joint dislocation. 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 Age uncertain dislocation is noted in the second and third metacarpals. Multilevel osteoarthritis with erosive osteoarthritis in the fourth PIP joint. Advanced osteoporosis. Chondrocalcinosis in the  proximal radial region may be due either to osteoarthritis or calcium pyrophosphate deposition disease. Electronically Signed   By: Bretta Bang III M.D.   On: 11/13/2016 08:16   Ct Maxillofacial Wo Contrast  Result Date: 11/13/2016 CLINICAL DATA:  Pain following fall EXAM: CT HEAD WITHOUT CONTRAST CT MAXILLOFACIAL WITHOUT CONTRAST CT CERVICAL SPINE WITHOUT CONTRAST TECHNIQUE: Multidetector CT imaging of the head, cervical spine, and maxillofacial structures were performed using the standard protocol without intravenous contrast. Multiplanar CT image reconstructions of the cervical spine and maxillofacial structures were also generated. COMPARISON:  Head CT and cervical spine CT February 16, 2016 FINDINGS: CT HEAD FINDINGS Brain: There is moderate diffuse atrophy. There is no intracranial mass, hemorrhage, extra-axial fluid collection, or midline shift. There is patchy small vessel disease in the centra semiovale bilaterally. There is mild small vessel disease in each thalamus. There is no new gray-white compartment lesion. No acute infarct evident. Vascular: No hyperdense vessel. There is calcification in both carotid siphon regions. Skull: Bony calvarium appears intact. Other: Mastoid air cells are clear. CT MAXILLOFACIAL FINDINGS Osseous: There is a comminuted fracture of the lateral orbital wall on the right. There is a comminuted orbital floor fracture on the right with depressed fracture fragments. There does not appear to be extraocular muscle entrapment from this depressed orbital floor fracture. There is a comminuted anterior maxillary wall fracture on the right with fracture fragments depressed posteriorly into the maxillary antral region. There is a displaced fracture along the medial maxillary antrum on the right with a posterior fragment lateral to the anterior fragment. The pterygoid plate on the right remains intact. However, superior to the pterygoid plate on the right, there is a mildly  displaced fracture of the posterior wall of the maxillary antrum on the right. There is a fracture through the midportion of the zygomatic arch on the right. No fractures to the left of midline are demonstrable. There is advanced arthropathy in both temporomandibular joints with erosion of the each mandibular condyle. Orbits: There is soft tissue swelling in the preseptal right orbital region. There is no intraorbital lesion beyond a small amount of air that has tracked from the right maxillary antrum into the inferior right orbit. There is no hemorrhage within either orbit. Sinuses: There is complete opacification of the right maxillary antrum, likely hemorrhage. Hemorrhage tracks into the right near ease, likely through a fracture of the medial maxillary sinus wall on the right. There is obstruction of the ostiomeatal unit complex on the right. There is opacification in several ethmoid air cells bilaterally, more on the right than on the left. There is an air-fluid level in a posterior right ethmoid air cell. Frontal sinuses are clear. There is  hemorrhage in the right sphenoid sinus complex with air-fluid level. No appreciable mucosal thickening seen in this area. Soft tissues: There is marked soft tissue swelling over the entire right face with areas of developing hematoma throughout the right facial region. Air tracks into the soft tissues on the right via the complex maxillary sinus region fractures. More inferiorly, soft tissues appear normal. No abscess. Salivary glands appear normal. Visualize pharynx appears unremarkable. No tongue or tongue base lesions evident. CT CERVICAL SPINE FINDINGS Alignment: There is levoscoliosis. There is 2 mm of anterolisthesis of C4 on C5. There is 2 mm of C5 on C6. There is 3 mm of anterolisthesis of C6 on C7. There is 2 mm of anterolisthesis of T1 on T2. Skull base and vertebrae: The skull base and craniocervical junction regions appear normal. There is no appreciable  fracture. There are no blastic or lytic bone lesions. There is osteoarthritic change with cystic lesions in the odontoid. No impending fracture from this cystic change evident. Soft tissues and spinal canal: Prevertebral soft tissues and predental space regions are normal. No paraspinous lesions. No cord or canal hematoma is demonstrable on this study. No spinal stenosis evident. Disc levels: There is moderately severe disc space narrowing at C3-4, C5-6, C6-7, C7-T1. There is moderate disc space narrowing at C4-5. There is facet hypertrophy at multiple levels. No disc extrusion. Upper chest: Visualized lung apices are clear. Other: There is atherosclerotic calcification in each carotid artery. IMPRESSION: CT head: Chronic atrophy with supratentorial small vessel disease, stable. No intracranial mass, hemorrhage, or extra-axial fluid collection. No acute infarct evident. No calvarial fracture. CT maxillofacial: There are fractures of the right orbital wall and right orbital floor. There are fractures of the right anterior, medial, lateral, and posterior maxillary antral walls with displaced fracture fragments in all of these regions. The pterygoid plate on the right remains intact. Posterior maxillary antral wall fracture is superior to the pterygoid plate. There is a mildly displaced fracture of the midportion of the zygomatic arch. The posterior aspect of the right zygomatic arch is displaced mildly laterally compared to the anterior portion. No fractures to the left of midline apparent. Extensive right-sided paranasal sinus disease is noted, most pronounced in the right maxillary antral region. Air tracks from the right maxillary antrum throughout the surrounding face and inferior orbital regions. 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 CT cervical spine: Scoliosis with mild spondylolisthesis at several levels, felt to be due to underlying spondylosis. No fracture. Multilevel  osteoarthritic change. Bilateral carotid artery calcification noted. Electronically Signed   By: Bretta Bang III M.D.   On: 11/13/2016 08:38    EKG: Independently reviewed. Sinus at 64, no acute changes  Assessment/Plan Active Problems:   Essential hypertension   Hypothyroidism   Dementia   CKD (chronic kidney disease) stage 3, GFR 30-59 ml/min   AKI (acute kidney injury) (HCC)   Fall   Right orbital fracture (HCC)   Right maxillary fracture (HCC)   Closed fracture of left proximal humerus   Right radial fracture    1. Right orbital fracture/right maxillary fracture. Case was discussed by Dr.Mesner with Dr. Annalee Genta on call for ENT. It was felt that patient's injuries were nonoperative and the patient should follow-up with ENT in the office in 10 days. Soft diet was recommended. It was advised the patient not blow her nose. 2. Right radial fracture/left humerus fracture. Currently in sling/splint. Orthopedics has been consulted. 3. Chronic kidney disease stage III with acute  kidney injury. Suspect is related to volume depletion. We'll continue fluids overnight and recheck labs in a.m . 4. Hypothyroidism. Continue Synthroid 5. Hypertension. Stable. Continue outpatient regimen 6. Fall. The patient has sustained several injuries. Case was discussed with Dr. Dwain Sarna on call for trauma surgery who agreed with keeping the patient at [facility] . Physical therapy will be consulted. She will receive IV morphine for pain control. She will likely need a higher level of care on discharge.  DVT prophylaxis: heparin Code Status: DNR Family Communication: discussed with son at the bedside Disposition Plan: possible SNF placement Consults called: orthopedic Admission status: inpatient, Link Snuffer MD [facility] Pager [phone]  If 7PM-7AM, please contact night-coverage [email or web address] Password [facility]  11/13/2016, 5:58 PM

## 2016-11-13 NOTE — ED Provider Notes (Signed)
MSE was initiated and I personally evaluated the patient and placed orders (if any) at  6:53 AM on November 13, 2016.  The patient appears stable so that the remainder of the MSE may be completed by another provider.  Pt here from nursing home for reported fall She has bruising to right face She has deformity to right wrist and laceration to right fingers Deformity to left shoulder She appears uncomfortable Imaging/labs ordered    Zadie Rhineonald Cozette Braggs, MD 11/13/16 [phone]

## 2016-11-13 NOTE — ED Notes (Signed)
[facility]. Informed [facility] nurse that patient was being admitted.

## 2016-11-13 NOTE — ED Notes (Addendum)
Pt alert and responsive Pt oriented to person only. Unable to determine cause of fall. States she was doing exercise. Pt with cervical collar in place. Right eye with bruising and swelling. Dried blood in bilateral nose and mouth. Noted deformity to right wrist with laceration to middle  ring finger. Pain with movement left shoulder.

## 2016-11-13 NOTE — ED Provider Notes (Signed)
THIS IS A SHARED VISIT WITH DR. Clayborne DanaMESNER.   PROCEDURE NOTE: LACERATION REPAIR Performed by: NEESE,HOPE Authorized by: NEESE,HOPE Consent: Verbal consent obtained. Risks and benefits: risks, benefits and alternatives were discussed Consent given by: patient Patient identity confirmed: provided demographic data Prepped and Draped in normal sterile fashion Wound explored  Laceration Location: right middle finger dorsum  Laceration Length: 2 cm  No Foreign Bodies seen or palpated  Anesthesia: local infiltration  Local anesthetic: lidocaine 1% without epinephrine  Anesthetic total: 2 ml  Irrigation method: syringe Amount of cleaning: standard  Tendon visualized, not lacerated  Skin closure: 4-0 prolene  Number of sutures: 5  Technique: simple interrupted  Patient tolerance: Patient tolerated the procedure well with no immediate complications.    La JaraHope M Neese, NP 11/13/16 1031    Marily MemosJason Mesner, MD 11/14/16 [phone]

## 2016-11-13 NOTE — ED Notes (Signed)
Son at bedside. Pt transported to xray

## 2016-11-13 NOTE — ED Notes (Signed)
Cleared by EDP to remove cervical collar. Resident cleaned and repositioned. Suction moderate amot of blood from pts mouth

## 2016-11-13 NOTE — ED Notes (Signed)
Pt has c collar applied pt has wound to right index finger pt state that she always wears a ring on her right hand she has deformities to left shoulder and right wrist. She has blood in her mouth and nose her right eye is swollen shut. Pt had yellow fall risk bracelet and yellow socks applied.

## 2016-11-14 DIAGNOSIS — S62644A Nondisplaced fracture of proximal phalanx of right ring finger, initial encounter for closed fracture: Secondary | ICD-10-CM

## 2016-11-14 DIAGNOSIS — D649 Anemia, unspecified: Secondary | ICD-10-CM

## 2016-11-14 DIAGNOSIS — S02401A Maxillary fracture, unspecified, initial encounter for closed fracture: Secondary | ICD-10-CM

## 2016-11-14 DIAGNOSIS — S61214A Laceration without foreign body of right ring finger without damage to nail, initial encounter: Secondary | ICD-10-CM

## 2016-11-14 LAB — CBC
HCT: 25.8 % — ABNORMAL LOW (ref 36.0–46.0)
Hemoglobin: 8.8 g/dL — ABNORMAL LOW (ref 12.0–15.0)
MCH: 32.7 pg (ref 26.0–34.0)
MCHC: 34.1 g/dL (ref 30.0–36.0)
MCV: 95.9 fL (ref 78.0–100.0)
PLATELETS: 188 10*3/uL (ref 150–400)
RBC: 2.69 MIL/uL — ABNORMAL LOW (ref 3.87–5.11)
RDW: 13.3 % (ref 11.5–15.5)
WBC: 11 10*3/uL — ABNORMAL HIGH (ref 4.0–10.5)

## 2016-11-14 LAB — BASIC METABOLIC PANEL
Anion gap: 6 (ref 5–15)
BUN: 29 mg/dL — AB (ref 6–20)
CHLORIDE: 107 mmol/L (ref 101–111)
CO2: 24 mmol/L (ref 22–32)
CREATININE: 1.2 mg/dL — AB (ref 0.44–1.00)
Calcium: 8.3 mg/dL — ABNORMAL LOW (ref 8.9–10.3)
GFR calc Af Amer: 43 mL/min — ABNORMAL LOW (ref >60)
GFR calc non Af Amer: 37 mL/min — ABNORMAL LOW (ref >60)
Glucose, Bld: 138 mg/dL — ABNORMAL HIGH (ref 65–99)
Potassium: 4.1 mmol/L (ref 3.5–5.1)
SODIUM: 137 mmol/L (ref 135–145)

## 2016-11-14 MED ORDER — MAGNESIUM HYDROXIDE 400 MG/5ML PO SUSP: 30.0000 mL | Freq: Every day | ORAL | Status: DC | PRN

## 2016-11-14 NOTE — NC FL2 (Signed)
[place] MEDICAID FL2 LEVEL OF CARE SCREENING TOOL     IDENTIFICATION  Patient Name: [name] Birthdate: [birth year] Sex: female Admission Date (Current Location): 11/13/2016  [facility] [place] Number:  [employer] and Address:  [facility],  [address], [name] [address]      Provider Number: [id number]  Attending Physician Name and Address:  Erick Blinks, MD  Relative Name and Phone Number:       Current Level of Care: Hospital Recommended Level of Care: Skilled Nursing Facility Prior Approval Number:    Date Approved/Denied:   PASRR Number: [id number]  Discharge Plan: SNF    Current Diagnoses: Patient Active Problem List   Diagnosis Date Noted  . Laceration of right ring finger without foreign body without damage to nail   . Closed nondisplaced fracture of proximal phalanx of right ring finger   . AKI (acute kidney injury) (HCC) 11/13/2016  . Fall 11/13/2016  . Right orbital fracture (HCC) 11/13/2016  . Right maxillary fracture (HCC) 11/13/2016  . Closed fracture of left proximal humerus 11/13/2016  . Right radial fracture 11/13/2016  . Delirium 02/19/2016  . Fracture of multiple pubic rami (HCC) 02/16/2016  . PUD (peptic ulcer disease) 02/16/2016  . Hyponatremia 02/16/2016  . Infection of urinary tract 02/16/2016  . Acute respiratory failure with hypoxia (HCC) 06/07/2015  . GERD (gastroesophageal reflux disease) 06/07/2015  . CKD (chronic kidney disease) stage 3, GFR 30-59 ml/min 06/07/2015  . HCAP (healthcare-associated pneumonia) 06/06/2015  . Fever 05/21/2012  . Encephalopathy acute 05/21/2012  . Dementia 05/21/2012  . History of fracture of right hip 05/21/2012  . History of subdural hematoma (post traumatic) 05/21/2012  . Hemiparesis (HCC) 05/15/2012  . Hypoxemia 05/15/2012  . Hypothyroidism 05/14/2012  . Anemia 05/14/2012  . GASTRIC ULCER 09/23/2009  . GI BLEEDING 08/20/2009  . DIARRHEA, ACUTE 08/18/2009  .  Peptic ulcer 07/31/2009  . HEMATEMESIS 07/31/2009  . Essential hypertension 07/30/2009  . DIVERTICULOSIS OF COLON 07/30/2009  . ARTHRITIS, BACK 07/30/2009  . OSTEOPOROSIS 07/30/2009  . DYSPHAGIA UNSPECIFIED 07/30/2009    Orientation RESPIRATION BLADDER Height & Weight     Self, Place  Normal Incontinent Weight: 102 lb 4.8 oz (46.4 kg) Height:  5\' 2"  (157.5 cm)  BEHAVIORAL SYMPTOMS/MOOD NEUROLOGICAL BOWEL NUTRITION STATUS      Incontinent Diet (soft diet/see dc summary)  AMBULATORY STATUS COMMUNICATION OF NEEDS Skin   Extensive Assist (uses walker) Verbally Skin abrasions, Bruising                       Personal Care Assistance Level of Assistance  Bathing, Feeding, Dressing Bathing Assistance: Limited assistance Feeding assistance: Limited assistance Dressing Assistance: Limited assistance     Functional Limitations Info  Sight, Hearing, Speech Sight Info: Adequate Hearing Info: Adequate Speech Info: Adequate    SPECIAL CARE FACTORS FREQUENCY  PT (By licensed PT), OT (By licensed OT)     PT Frequency: 5x OT Frequency: 5x            Contractures Contractures Info: Not present    Additional Factors Info  Code Status, Allergies Code Status Info: DNR Allergies Info: Amoxicillin-pot Clavulanate, Cephalexin, Nsaids           Current Medications (11/14/2016):  This is the current hospital active medication list Current Facility-Administered Medications  Medication Dose Route Frequency Provider Last Rate Last Dose  . 0.9 %  sodium chloride infusion   Intravenous Continuous Erick Blinks, MD 75  mL/hr at 11/14/16 0154    . acetaminophen (TYLENOL) tablet 650 mg  650 mg Oral Q6H PRN Erick BlinksJehanzeb Memon, MD       Or  . acetaminophen (TYLENOL) suppository 650 mg  650 mg Rectal Q6H PRN Erick BlinksJehanzeb Memon, MD      . donepezil (ARICEPT) tablet 10 mg  10 mg Oral Daily Erick BlinksJehanzeb Memon, MD   10 mg at 11/14/16 0926  . escitalopram (LEXAPRO) tablet 10 mg  10 mg Oral Daily Erick BlinksJehanzeb  Memon, MD   10 mg at 11/14/16 0926  . heparin injection 5,000 Units  5,000 Units Subcutaneous Q8H Erick BlinksJehanzeb Memon, MD   5,000 Units at 11/14/16 [id number]  . levothyroxine (SYNTHROID, LEVOTHROID) tablet 125 mcg  125 mcg Oral QAC breakfast Erick BlinksJehanzeb Memon, MD   125 mcg at 11/14/16 0925  . loperamide (IMODIUM) capsule 2 mg  2 mg Oral BID Erick BlinksJehanzeb Memon, MD   2 mg at 11/14/16 0926  . loratadine (CLARITIN) tablet 10 mg  10 mg Oral Daily Erick BlinksJehanzeb Memon, MD   10 mg at 11/14/16 0926  . losartan (COZAAR) tablet 50 mg  50 mg Oral Daily Erick BlinksJehanzeb Memon, MD   50 mg at 11/14/16 0926  . MEDLINE mouth rinse  15 mL Mouth Rinse BID Erick BlinksJehanzeb Memon, MD   15 mL at 11/14/16 0941  . memantine (NAMENDA) tablet 10 mg  10 mg Oral BID Erick BlinksJehanzeb Memon, MD   10 mg at 11/14/16 0926  . morphine 2 MG/ML injection 1-2 mg  1-2 mg Intravenous Q4H PRN Erick BlinksJehanzeb Memon, MD   2 mg at 11/14/16 [id number]  . ondansetron (ZOFRAN) tablet 4 mg  4 mg Oral Q6H PRN Erick BlinksJehanzeb Memon, MD       Or  . ondansetron (ZOFRAN) injection 4 mg  4 mg Intravenous Q6H PRN Erick BlinksJehanzeb Memon, MD      . sulfamethoxazole-trimethoprim (BACTRIM DS,SEPTRA DS) 800-160 MG per tablet 1 tablet  1 tablet Oral BID Erick BlinksJehanzeb Memon, MD   1 tablet at 11/14/16 [id number]     Discharge Medications: Please see discharge summary for a list of discharge medications.  Relevant Imaging Results:  Relevant Lab Results:   Additional Information SS#: [id number]     Raye SorrowCoble, Harshita Bernales N, [place]

## 2016-11-14 NOTE — Progress Notes (Signed)
OT Cancellation Note  Patient Details Name: Hedda Sladenn P Abed MRN: [id number] DOB: 1920/02/25   Cancelled Treatment:    Reason Eval/Treat Not Completed: Medical issues which prohibited therapy. Due to patient's recent left humerus and right radius fracture OT will be held until Ortho has a chance to see patient.   Limmie PatriciaLaura Rosenda Geffrard, OTR/L,CBIS  [phone]  11/14/2016, 9:26 AM

## 2016-11-14 NOTE — Progress Notes (Signed)
PT Cancellation Note  Patient Details Name: [name] P Wilemon MRN: [id number] DOB: [birth year]   Cancelled Treatment:    Reason Eval/Treat Not Completed: Patient not medically ready (Hold PT evaluation until ortho consult has been completed due to L humeral fx, and R radial fx.  PT will need B UE weight bearing clarifications as pt was using a RW prior to admission for ambulation. )   Beth Deaken Jurgens, PT, DPT X: [phone]

## 2016-11-14 NOTE — Consult Note (Addendum)
Reason for Consult: multiples fractures Referring Physician: DR Veneda Melter  Right hand RRF FRACTURE  RIGHT DISTAL RADIUS FRACTURE  LEFT PROXIMAL HUMERUS FRACTURE   [name] is an 83 y.o. female.  HPI: HISTORY FROM RECORD(PATIENT WITH DEMENTIA)  [name] is a 83 y.o. female with medical history significant of chronic kidney disease, dementia, hypertension and hypothyroidism. Patient is a resident of assisted living facility. She was brought to the hospital today after being found on the floor. Patient apparently had fallen out of bed and was screaming in pain when staff found her on floor. Prior to her fall, she was reportedly in her normal state of health. She was recently diagnosed with a urinary tract infection and was started on Bactrim. She has another 2 days of treatment. She did not been having any fevers . Oral intake had been normal for her.    Past Medical History:  Diagnosis Date  . Anemia   . CKD (chronic kidney disease) stage 3, GFR 30-59 ml/min 06/07/2015  . Dementia   . Escherichia coli urinary tract infection 02/16/16   Cipro  . Gastric ulcer    gi bleed secondary  . HCAP (healthcare-associated pneumonia) 06/06/2015  . History of fracture of right hip 05/21/2012   ORIF 05/13/12   . History of subdural hematoma (post traumatic) 05/21/2012   Dx on MR I in September 2013   . Hypertension   . Hypothyroidism   . Macular degeneration   . Vertigo     Past Surgical History:  Procedure Laterality Date  . ABDOMINAL HYSTERECTOMY    . CARPAL TUNNEL RELEASE    . FEMUR IM NAIL  05/13/2012   Procedure: INTRAMEDULLARY (IM) NAIL FEMORAL;  Surgeon: Senaida Lange, MD;  Location: [facility] OR;  Service: Orthopedics;  Laterality: Right;  . FRACTURE SURGERY    . MASTECTOMY    . THYROID SURGERY      Family History  Problem Relation Age of Onset  . Thyroid disease Neg Hx   . Cancer Neg Hx   . Diabetes Neg Hx   . Heart disease Neg Hx   . Stroke Neg Hx     Social History:   reports that she has never smoked. She has never used smokeless tobacco. She reports that she does not drink alcohol or use drugs.  Allergies:  Allergies  Allergen Reactions  . Amoxicillin-Pot Clavulanate     REACTION: UNKNOWN REACTION  . Cephalexin     REACTION: UNKNOWN REACTION  . Nsaids     UNKNOWN REACTION     Current Facility-Administered Medications:  .  0.9 %  sodium chloride infusion, , Intravenous, Continuous, Erick Blinks, MD, Last Rate: 75 mL/hr at 11/14/16 0154 .  acetaminophen (TYLENOL) tablet 650 mg, 650 mg, Oral, Q6H PRN **OR** acetaminophen (TYLENOL) suppository 650 mg, 650 mg, Rectal, Q6H PRN, Erick Blinks, MD .  donepezil (ARICEPT) tablet 10 mg, 10 mg, Oral, Daily, Erick Blinks, MD .  escitalopram (LEXAPRO) tablet 10 mg, 10 mg, Oral, Daily, Erick Blinks, MD .  heparin injection 5,000 Units, 5,000 Units, Subcutaneous, Q8H, Erick Blinks, MD, 5,000 Units at 11/14/16 2952 .  levothyroxine (SYNTHROID, LEVOTHROID) tablet 125 mcg, 125 mcg, Oral, QAC breakfast, Erick Blinks, MD .  loperamide (IMODIUM) capsule 2 mg, 2 mg, Oral, BID, Erick Blinks, MD, 2 mg at 11/13/16 2120 .  loratadine (CLARITIN) tablet 10 mg, 10 mg, Oral, Daily, Erick Blinks, MD .  losartan (COZAAR) tablet 50 mg, 50 mg, Oral, Daily, [employer],  MD .  MEDLINE mouth rinse, 15 mL, Mouth Rinse, BID, Erick Blinks, MD, 15 mL at 11/13/16 2200 .  memantine (NAMENDA) tablet 10 mg, 10 mg, Oral, BID, Erick Blinks, MD, 10 mg at 11/13/16 2120 .  morphine 2 MG/ML injection 1-2 mg, 1-2 mg, Intravenous, Q4H PRN, Erick Blinks, MD, 2 mg at 11/14/16 4098 .  ondansetron (ZOFRAN) tablet 4 mg, 4 mg, Oral, Q6H PRN **OR** ondansetron (ZOFRAN) injection 4 mg, 4 mg, Intravenous, Q6H PRN, Erick Blinks, MD .  sulfamethoxazole-trimethoprim (BACTRIM DS,SEPTRA DS) 800-160 MG per tablet 1 tablet, 1 tablet, Oral, BID, Erick Blinks, MD, 1 tablet at 11/13/16 2120   BMP Latest Ref Rng & Units 11/14/2016 11/13/2016  03/22/2016  Glucose 65 - 99 mg/dL 119(J) 478(G) 88  BUN 6 - 20 mg/dL 95(A) 21(H) 08(M)  Creatinine 0.44 - 1.00 mg/dL 5.78(I) 6.96(E) 9.52(W)  Sodium 135 - 145 mmol/L 137 134(L) 138  Potassium 3.5 - 5.1 mmol/L 4.1 4.5 4.9  Chloride 101 - 111 mmol/L 107 102 103  CO2 22 - 32 mmol/L 24 24 30   Calcium 8.9 - 10.3 mg/dL 8.3(L) 9.2 9.0   CBC Latest Ref Rng & Units 11/14/2016 11/13/2016 03/22/2016  WBC 4.0 - 10.5 K/uL 11.0(H) 14.0(H) 8.6  Hemoglobin 12.0 - 15.0 g/dL 4.1(L) 11.9(L) 11.5(L)  Hematocrit 36.0 - 46.0 % 25.8(L) 35.3(L) 34.2(L)  Platelets 150 - 400 K/uL 188 230 241     Review of Systems  Unable to perform ROS: Dementia   Blood pressure (!) 156/98, pulse 85, temperature 98.9 F (37.2 C), temperature source Axillary, resp. rate 20, height 5\' 2"  (1.575 m), weight 102 lb 4.8 oz (46.4 kg), SpO2 94 %. Physical Exam General appearance of the patient. I see no developmental abnormalities. I cannot assess nutrition. Body habitus is endomorphic. There are no gross deformities. However, she has multiple facial abrasions especially on the right side  Peripheral vascular system minimal swelling and temperature of her legs is normal without edema  Lymph nodes in the axilla and groin area are normal  She has no ambulatory ability at present as she is significantly sedated  On the right side in the upper shoulder she has no tenderness to palpation just normal passive range of motion without joint subluxation and normal muscle tone   however on the wrist she has a splint is no gross deformity of the ring finger which was fractured there is suture line with no evidence of infection drainage  On the left side she has ecchymosis in the axilla and over the lateral deltoid with tenderness mild crepitation at the fracture site with no evidence of contracture we did not assess range of motion because of fracture there was no subluxation dislocation of the shoulder elbow or wrist muscle tone was normal  In  the right and left lower extremities alignment is normal passive range of motion is normal at the hip and knee hip knee ankle without subluxation muscle strength and tone normal  Could not test coordination because of her sedation  Gross reflexes in terms of Babinski's were normal  Could not assess orientation normal mood   Assessment/Plan: RIGHT RING FINGER PROX PHAL FRACTURE  LEFT PROXIMAL HUM FRACTURE RIGHT DISTAL RAD FRACTURE   Multiple nondisplaced fractures, this is a correction to the medical record on 11/16/2016 at 11:16 AM none of these fractures will require surgical treatment, however with a combination of fractures will prove to be very disabling for the patient and will make it very difficult for her  to be cared for. Both upper extremities require nonweightbearing status for 6 weeks  [id number]  Fuller CanadaStanley Kiri Hinderliter 11/14/2016, 8:15 AM
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
## 2016-11-14 NOTE — Progress Notes (Signed)
PROGRESS NOTE    [name]  WJX:[id number] DOB: October 31, 1919 DOA: 11/13/2016 PCP: [name], MD    Brief Narrative:  83 y/o female resident of ALF, was brought to ED after a fall. She has sustained several injuries as described below. Orthopedics is evaluating for bilateral upper extremity fractures. She was also dehydrated with AKI and is on IV fluids.    Assessment & Plan:   Active Problems:   Essential hypertension   Hypothyroidism   Anemia   Dementia   CKD (chronic kidney disease) stage 3, GFR 30-59 ml/min   AKI (acute kidney injury) (HCC)   Fall   Right orbital fracture (HCC)   Right maxillary fracture (HCC)   Closed fracture of left proximal humerus   Right radial fracture   Laceration of right ring finger without foreign body without damage to nail   Closed nondisplaced fracture of proximal phalanx of right ring finger   1. Right orbital fracture/right maxillary fracture. Case was discussed by Dr.Mesner with Dr. Annalee Genta on call for ENT. It was felt that patient's injuries were nonoperative and the patient should follow-up with ENT in the office in 10 days. Soft diet was recommended. It was advised the patient not blow her nose. 2. Right radial fracture/left humerus fracture. Currently in sling/splint. Orthopedics has been consulted. 3. Chronic kidney disease stage III with acute kidney injury. Suspect is related to volume depletion. Improving with IV fluids. 4. Hypothyroidism. Continue Synthroid 5. Hypertension. Stable. Continue outpatient regimen 6. Fall. The patient has sustained several injuries. Case was discussed with Dr. Dwain Sarna on call for trauma surgery who agreed with keeping the patient at [facility] . Physical therapy has been consulted. She will receive IV morphine for pain control. She will likely need a higher level of care on discharge. 7. Anemia. Patient has had a significant decline in hemoglobin. She has had some bleeding from  facial injuries. There is also likely an element of hemodilution, since she presented with dehydration. Continue to monitor. 8. Dementia.   DVT prophylaxis: heparin Code Status: DNR Family Communication: discussed with sons at the bedside Disposition Plan: will likely need SNF   Consultants:   orthopedics  Procedures:     Antimicrobials:      Subjective: Confused, feels comfortable  Objective: Vitals:   11/14/16 0617 11/14/16 0700 11/14/16 1200 11/14/16 1500  BP:  (!) 156/98  (!) 147/64  Pulse: 85  77   Resp: 20     Temp: 98.9 F (37.2 C)  97.4 F (36.3 C)   TempSrc: Axillary  Oral   SpO2: 94%  97%   Weight:      Height:        Intake/Output Summary (Last 24 hours) at 11/14/16 1641 Last data filed at 11/13/16 2224  Gross per 24 hour  Intake                0 ml  Output                1 ml  Net               -1 ml   Filed Weights   11/13/16 0646 11/13/16 1227  Weight: 46.3 kg (102 lb) 46.4 kg (102 lb 4.8 oz)    Examination:  General exam: Appears calm and comfortable  Respiratory system: bilateral rhonchi. Respiratory effort normal. Cardiovascular system: S1 & S2 heard, RRR. No JVD, murmurs, rubs, gallops or clicks. No pedal edema. Gastrointestinal system: Abdomen  is nondistended, soft and nontender. No organomegaly or masses felt. Normal bowel sounds heard. Central nervous system: confused. No focal neurological deficits. Extremities: bilateral upper extremities in sling/ splint. Skin: ecchymosis over right face with swelling Psychiatry: confused    Data Reviewed: I have personally reviewed following labs and imaging studies  CBC:  Recent Labs Lab 11/13/16 0705 11/14/16 0431  WBC 14.0* 11.0*  NEUTROABS 9.0*  --   HGB 11.9* 8.8*  HCT 35.3* 25.8*  MCV 96.7 95.9  PLT 230 188   Basic Metabolic Panel:  Recent Labs Lab 11/13/16 0705 11/14/16 0431  NA 134* 137  K 4.5 4.1  CL 102 107  CO2 24 24  GLUCOSE 110* 138*  BUN 30* 29*    CREATININE 1.72* 1.20*  CALCIUM 9.2 8.3*   GFR: Estimated Creatinine Clearance: 20.5 mL/min (A) (by C-G formula based on SCr of 1.2 mg/dL (H)). Liver Function Tests: No results for input(s): AST, ALT, ALKPHOS, BILITOT, PROT, ALBUMIN in the last 168 hours. No results for input(s): LIPASE, AMYLASE in the last 168 hours. No results for input(s): AMMONIA in the last 168 hours. Coagulation Profile: No results for input(s): INR, PROTIME in the last 168 hours. Cardiac Enzymes: No results for input(s): CKTOTAL, CKMB, CKMBINDEX, TROPONINI in the last 168 hours. BNP (last 3 results) No results for input(s): PROBNP in the last 8760 hours. HbA1C: No results for input(s): HGBA1C in the last 72 hours. CBG: No results for input(s): GLUCAP in the last 168 hours. Lipid Profile: No results for input(s): CHOL, HDL, LDLCALC, TRIG, CHOLHDL, LDLDIRECT in the last 72 hours. Thyroid Function Tests: No results for input(s): TSH, T4TOTAL, FREET4, T3FREE, THYROIDAB in the last 72 hours. Anemia Panel: No results for input(s): VITAMINB12, FOLATE, FERRITIN, TIBC, IRON, RETICCTPCT in the last 72 hours. Sepsis Labs: No results for input(s): PROCALCITON, LATICACIDVEN in the last 168 hours.  Recent Results (from the past 240 hour(s))  MRSA PCR Screening     Status: None   Collection Time: 11/13/16 11:58 AM  Result Value Ref Range Status   MRSA by PCR NEGATIVE NEGATIVE Final    Comment:        The GeneXpert MRSA Assay (FDA approved for NASAL specimens only), is one component of a comprehensive MRSA colonization surveillance program. It is not intended to diagnose MRSA infection nor to guide or monitor treatment for MRSA infections.          Radiology Studies: Ct Abdomen Pelvis Wo Contrast  Result Date: 11/13/2016 CLINICAL DATA:  Found on floor. Complains of right facial pain with bruising and swelling. Left arm pain. EXAM: CT CHEST, ABDOMEN AND PELVIS WITHOUT CONTRAST TECHNIQUE: Multidetector CT  imaging of the chest, abdomen and pelvis was performed following the standard protocol without IV contrast. COMPARISON:  Chest radiograph 11/13/2016. CT pelvis 02/16/2016. Lumbar spine 02/12/2016 FINDINGS: CT CHEST FINDINGS Cardiovascular: Calcifications of the aortic root. Calcifications at the aortic arch. Caliber of the thoracic aorta is within normal limits. Mediastinum/Nodes: No significant chest lymphadenopathy. There is minimal pericardial fluid. Lungs/Pleura: Trachea and mainstem bronchi are patent. 1.1 x 0.5 cm nodule structure in the right upper lobe on sequence 3, image 49. Small calcified granuloma in the right upper lobe on image 43. Mild scarring at the lung apices. No large areas of airspace disease or lung consolidation. Negative for pneumothorax. Musculoskeletal: Significant degenerative changes and joint space narrowing in the right shoulder joint. There is a large bursal fluid collection around the right shoulder. Displaced and comminuted fracture of  the proximal left humerus. Evidence for prior left mastectomy. CT ABDOMEN PELVIS FINDINGS Hepatobiliary: Gallbladder is mildly distended with evidence of stones or sludge. No gross abnormality to the liver on this noncontrast examination. Pancreas: Limited evaluation of pancreas without gross abnormality. Spleen: Normal appearance of spleen without enlargement. Adrenals/Urinary Tract: Adrenal glands not well visualized. There is quite a bit of motion artifact on this examination. There are least 2 small hyperdense structures involving the left kidney, largest measuring 0.8 cm in the anterior left interpolar region. Negative for hydronephrosis. Stomach/Bowel: The rectum is distended with a large amount stool. Normal caliber of the small bowel and stomach. No evidence for bowel obstruction. Vascular/Lymphatic: Atherosclerotic calcifications of the abdominal aorta without aneurysm. No significant lymph node enlargement in the abdomen or pelvis.  Reproductive: Status post hysterectomy. No adnexal masses. Other: No free fluid.  No free air. Musculoskeletal: Surgical fixation in the right hip. Old fracture involving the left inferior pubic ramus. There is a severe compression fracture of the L2 vertebral body with approximately 6 mm of bone retropulsion. This fracture is new from an exam on 02/12/2016. There is approximately 60-70% loss of vertebral body height at L2. Mild disc space narrowing at L4-L5. IMPRESSION: Comminuted and displaced fracture involving the proximal left humerus. Compression fracture of L2 with bone retropulsion. This is new from the previous examinations and this fracture is age indeterminate. No acute intrathoracic or intra-abdominal abnormalities. Limited evaluation of the intra-abdominal structures due to motion and the lack of intravenous contrast. Indeterminate 1.1 cm nodular lesion in the right upper lung. Neoplastic process cannot be excluded. This nodule could be further evaluated with a PET-CT if a neoplastic workup is desired. Hyperdense structures in left kidney. These could represent hemorrhagic or proteinaceous cyst but indeterminate. Left amount of fluid surrounding the right shoulder probably related to a bursal fluid collection. Electronically Signed   By: Richarda Overlie M.D.   On: 11/13/2016 09:08   Dg Chest 1 View  Result Date: 11/13/2016 CLINICAL DATA:  Patient found down at nursing home. EXAM: CHEST 1 VIEW COMPARISON:  Chest radiograph 06/06/2015 FINDINGS: Monitoring leads overlie the patient. Stable enlarged cardiac and mediastinal contours with tortuosity and calcification of the thoracic aorta. No large area of pulmonary consolidation. No pleural effusion or pneumothorax. Displaced proximal left humeral diaphysis fracture. IMPRESSION: No acute cardiopulmonary process. Proximal left humerus diaphyseal fracture. See dedicated radiographs. Electronically Signed   By: [name] Belt M.D.   On: 11/13/2016 08:09   Dg  Pelvis 1-2 Views  Result Date: 11/13/2016 CLINICAL DATA:  Unwitnessed fall with pain EXAM: PELVIS - 1-2 VIEW COMPARISON:  February 17, 2016 FINDINGS: There is screw and plate fixation in the proximal right femur with alignment in these areas is anatomic. The tip of the screw is in the proximal femoral head. There is no evident acute fracture or dislocation. There is stable moderate narrowing of both hip joints. No erosive change. Bones are diffusely osteoporotic. There is aortic atherosclerosis. IMPRESSION: Postoperative change right femur. No acute fracture or dislocation. Bones osteoporotic. Symmetric narrowing both hip joints, stable. Aortic atherosclerosis. Electronically Signed   By: Bretta Bang III M.D.   On: 11/13/2016 08:08   Dg Wrist Complete Right  Result Date: 11/13/2016 CLINICAL DATA:  Pain following fall EXAM: RIGHT WRIST - COMPLETE 3+ VIEW COMPARISON:  None. FINDINGS: Frontal, oblique, and lateral views were obtained. There is an impacted fracture of the distal radial metaphysis. There is evidence of what appears to be an old fracture  of the distal scaphoid bone with remodeling. No other fractures are evident. There appears to be dislocation at the scaphotrapezial joint. There is evidence of what appears to be chronic scapholunate disassociation. There is advanced osteoarthritic change in the lateral wrist region. Bones are osteoporotic. There is extensive chondrocalcinosis. IMPRESSION: Acute fracture of the right distal radial metaphysis with impaction at the fracture site. Evidence of old fracture of the distal scaphoid with remodeling in this area. Apparent scaphotrapezial joint dislocation, likely chronic. 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 Advanced arthropathy in the lateral aspect of the wrist joint. Extensive chondrocalcinosis which may be due to either osteoarthritis or calcium pyrophosphate deposition disease. Bones diffusely osteoporotic. Electronically Signed   By: Bretta Bang III M.D.   On: 11/13/2016 08:13   Ct Head Wo Contrast  Result Date: 11/13/2016 CLINICAL DATA:  Pain following fall EXAM: CT HEAD WITHOUT CONTRAST CT MAXILLOFACIAL WITHOUT CONTRAST CT CERVICAL SPINE WITHOUT CONTRAST TECHNIQUE: Multidetector CT imaging of the head, cervical spine, and maxillofacial structures were performed using the standard protocol without intravenous contrast. Multiplanar CT image reconstructions of the cervical spine and maxillofacial structures were also generated. COMPARISON:  Head CT and cervical spine CT February 16, 2016 FINDINGS: CT HEAD FINDINGS Brain: There is moderate diffuse atrophy. There is no intracranial mass, hemorrhage, extra-axial fluid collection, or midline shift. There is patchy small vessel disease in the centra semiovale bilaterally. There is mild small vessel disease in each thalamus. There is no new gray-white compartment lesion. No acute infarct evident. Vascular: No hyperdense vessel. There is calcification in both carotid siphon regions. Skull: Bony calvarium appears intact. Other: Mastoid air cells are clear. CT MAXILLOFACIAL FINDINGS Osseous: There is a comminuted fracture of the lateral orbital wall on the right. There is a comminuted orbital floor fracture on the right with depressed fracture fragments. There does not appear to be extraocular muscle entrapment from this depressed orbital floor fracture. There is a comminuted anterior maxillary wall fracture on the right with fracture fragments depressed posteriorly into the maxillary antral region. There is a displaced fracture along the medial maxillary antrum on the right with a posterior fragment lateral to the anterior fragment. The pterygoid plate on the right remains intact. However, superior to the pterygoid plate on the right, there is a mildly displaced fracture of the posterior wall of the maxillary antrum on the right. There is a fracture through the midportion of the zygomatic arch on the  right. No fractures to the left of midline are demonstrable. There is advanced arthropathy in both temporomandibular joints with erosion of the each mandibular condyle. Orbits: There is soft tissue swelling in the preseptal right orbital region. There is no intraorbital lesion beyond a small amount of air that has tracked from the right maxillary antrum into the inferior right orbit. There is no hemorrhage within either orbit. Sinuses: There is complete opacification of the right maxillary antrum, likely hemorrhage. Hemorrhage tracks into the right near ease, likely through a fracture of the medial maxillary sinus wall on the right. There is obstruction of the ostiomeatal unit complex on the right. There is opacification in several ethmoid air cells bilaterally, more on the right than on the left. There is an air-fluid level in a posterior right ethmoid air cell. Frontal sinuses are clear. There is hemorrhage in the right sphenoid sinus complex with air-fluid level. No appreciable mucosal thickening seen in this area. Soft tissues: There is marked soft tissue swelling over the entire right face with areas of developing  hematoma throughout the right facial region. Air tracks into the soft tissues on the right via the complex maxillary sinus region fractures. More inferiorly, soft tissues appear normal. No abscess. Salivary glands appear normal. Visualize pharynx appears unremarkable. No tongue or tongue base lesions evident. CT CERVICAL SPINE FINDINGS Alignment: There is levoscoliosis. There is 2 mm of anterolisthesis of C4 on C5. There is 2 mm of C5 on C6. There is 3 mm of anterolisthesis of C6 on C7. There is 2 mm of anterolisthesis of T1 on T2. Skull base and vertebrae: The skull base and craniocervical junction regions appear normal. There is no appreciable fracture. There are no blastic or lytic bone lesions. There is osteoarthritic change with cystic lesions in the odontoid. No impending fracture from this  cystic change evident. Soft tissues and spinal canal: Prevertebral soft tissues and predental space regions are normal. No paraspinous lesions. No cord or canal hematoma is demonstrable on this study. No spinal stenosis evident. Disc levels: There is moderately severe disc space narrowing at C3-4, C5-6, C6-7, C7-T1. There is moderate disc space narrowing at C4-5. There is facet hypertrophy at multiple levels. No disc extrusion. Upper chest: Visualized lung apices are clear. Other: There is atherosclerotic calcification in each carotid artery. IMPRESSION: CT head: Chronic atrophy with supratentorial small vessel disease, stable. No intracranial mass, hemorrhage, or extra-axial fluid collection. No acute infarct evident. No calvarial fracture. CT maxillofacial: There are fractures of the right orbital wall and right orbital floor. There are fractures of the right anterior, medial, lateral, and posterior maxillary antral walls with displaced fracture fragments in all of these regions. The pterygoid plate on the right remains intact. Posterior maxillary antral wall fracture is superior to the pterygoid plate. There is a mildly displaced fracture of the midportion of the zygomatic arch. The posterior aspect of the right zygomatic arch is displaced mildly laterally compared to the anterior portion. No fractures to the left of midline apparent. Extensive right-sided paranasal sinus disease is noted, most pronounced in the right maxillary antral region. Air tracks from the right maxillary antrum throughout the surrounding face and inferior orbital regions. 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 CT cervical spine: Scoliosis with mild spondylolisthesis at several levels, felt to be due to underlying spondylosis. No fracture. Multilevel osteoarthritic change. Bilateral carotid artery calcification noted. Electronically Signed   By: Bretta Bang III M.D.   On: 11/13/2016 08:38   Ct  Chest Wo Contrast  Result Date: 11/13/2016 CLINICAL DATA:  Found on floor. Complains of right facial pain with bruising and swelling. Left arm pain. EXAM: CT CHEST, ABDOMEN AND PELVIS WITHOUT CONTRAST TECHNIQUE: Multidetector CT imaging of the chest, abdomen and pelvis was performed following the standard protocol without IV contrast. COMPARISON:  Chest radiograph 11/13/2016. CT pelvis 02/16/2016. Lumbar spine 02/12/2016 FINDINGS: CT CHEST FINDINGS Cardiovascular: Calcifications of the aortic root. Calcifications at the aortic arch. Caliber of the thoracic aorta is within normal limits. Mediastinum/Nodes: No significant chest lymphadenopathy. There is minimal pericardial fluid. Lungs/Pleura: Trachea and mainstem bronchi are patent. 1.1 x 0.5 cm nodule structure in the right upper lobe on sequence 3, image 49. Small calcified granuloma in the right upper lobe on image 43. Mild scarring at the lung apices. No large areas of airspace disease or lung consolidation. Negative for pneumothorax. Musculoskeletal: Significant degenerative changes and joint space narrowing in the right shoulder joint. There is a large bursal fluid collection around the right shoulder. Displaced and comminuted fracture of the proximal  left humerus. Evidence for prior left mastectomy. CT ABDOMEN PELVIS FINDINGS Hepatobiliary: Gallbladder is mildly distended with evidence of stones or sludge. No gross abnormality to the liver on this noncontrast examination. Pancreas: Limited evaluation of pancreas without gross abnormality. Spleen: Normal appearance of spleen without enlargement. Adrenals/Urinary Tract: Adrenal glands not well visualized. There is quite a bit of motion artifact on this examination. There are least 2 small hyperdense structures involving the left kidney, largest measuring 0.8 cm in the anterior left interpolar region. Negative for hydronephrosis. Stomach/Bowel: The rectum is distended with a large amount stool. Normal caliber  of the small bowel and stomach. No evidence for bowel obstruction. Vascular/Lymphatic: Atherosclerotic calcifications of the abdominal aorta without aneurysm. No significant lymph node enlargement in the abdomen or pelvis. Reproductive: Status post hysterectomy. No adnexal masses. Other: No free fluid.  No free air. Musculoskeletal: Surgical fixation in the right hip. Old fracture involving the left inferior pubic ramus. There is a severe compression fracture of the L2 vertebral body with approximately 6 mm of bone retropulsion. This fracture is new from an exam on 02/12/2016. There is approximately 60-70% loss of vertebral body height at L2. Mild disc space narrowing at L4-L5. IMPRESSION: Comminuted and displaced fracture involving the proximal left humerus. Compression fracture of L2 with bone retropulsion. This is new from the previous examinations and this fracture is age indeterminate. No acute intrathoracic or intra-abdominal abnormalities. Limited evaluation of the intra-abdominal structures due to motion and the lack of intravenous contrast. Indeterminate 1.1 cm nodular lesion in the right upper lung. Neoplastic process cannot be excluded. This nodule could be further evaluated with a PET-CT if a neoplastic workup is desired. Hyperdense structures in left kidney. These could represent hemorrhagic or proteinaceous cyst but indeterminate. Left amount of fluid surrounding the right shoulder probably related to a bursal fluid collection. Electronically Signed   By: Richarda Overlie M.D.   On: 11/13/2016 09:08   Ct Cervical Spine Wo Contrast  Result Date: 11/13/2016 CLINICAL DATA:  Pain following fall EXAM: CT HEAD WITHOUT CONTRAST CT MAXILLOFACIAL WITHOUT CONTRAST CT CERVICAL SPINE WITHOUT CONTRAST TECHNIQUE: Multidetector CT imaging of the head, cervical spine, and maxillofacial structures were performed using the standard protocol without intravenous contrast. Multiplanar CT image reconstructions of the cervical  spine and maxillofacial structures were also generated. COMPARISON:  Head CT and cervical spine CT February 16, 2016 FINDINGS: CT HEAD FINDINGS Brain: There is moderate diffuse atrophy. There is no intracranial mass, hemorrhage, extra-axial fluid collection, or midline shift. There is patchy small vessel disease in the centra semiovale bilaterally. There is mild small vessel disease in each thalamus. There is no new gray-white compartment lesion. No acute infarct evident. Vascular: No hyperdense vessel. There is calcification in both carotid siphon regions. Skull: Bony calvarium appears intact. Other: Mastoid air cells are clear. CT MAXILLOFACIAL FINDINGS Osseous: There is a comminuted fracture of the lateral orbital wall on the right. There is a comminuted orbital floor fracture on the right with depressed fracture fragments. There does not appear to be extraocular muscle entrapment from this depressed orbital floor fracture. There is a comminuted anterior maxillary wall fracture on the right with fracture fragments depressed posteriorly into the maxillary antral region. There is a displaced fracture along the medial maxillary antrum on the right with a posterior fragment lateral to the anterior fragment. The pterygoid plate on the right remains intact. However, superior to the pterygoid plate on the right, there is a mildly displaced fracture of the posterior wall  of the maxillary antrum on the right. There is a fracture through the midportion of the zygomatic arch on the right. No fractures to the left of midline are demonstrable. There is advanced arthropathy in both temporomandibular joints with erosion of the each mandibular condyle. Orbits: There is soft tissue swelling in the preseptal right orbital region. There is no intraorbital lesion beyond a small amount of air that has tracked from the right maxillary antrum into the inferior right orbit. There is no hemorrhage within either orbit. Sinuses: There is  complete opacification of the right maxillary antrum, likely hemorrhage. Hemorrhage tracks into the right near ease, likely through a fracture of the medial maxillary sinus wall on the right. There is obstruction of the ostiomeatal unit complex on the right. There is opacification in several ethmoid air cells bilaterally, more on the right than on the left. There is an air-fluid level in a posterior right ethmoid air cell. Frontal sinuses are clear. There is hemorrhage in the right sphenoid sinus complex with air-fluid level. No appreciable mucosal thickening seen in this area. Soft tissues: There is marked soft tissue swelling over the entire right face with areas of developing hematoma throughout the right facial region. Air tracks into the soft tissues on the right via the complex maxillary sinus region fractures. More inferiorly, soft tissues appear normal. No abscess. Salivary glands appear normal. Visualize pharynx appears unremarkable. No tongue or tongue base lesions evident. CT CERVICAL SPINE FINDINGS Alignment: There is levoscoliosis. There is 2 mm of anterolisthesis of C4 on C5. There is 2 mm of C5 on C6. There is 3 mm of anterolisthesis of C6 on C7. There is 2 mm of anterolisthesis of T1 on T2. Skull base and vertebrae: The skull base and craniocervical junction regions appear normal. There is no appreciable fracture. There are no blastic or lytic bone lesions. There is osteoarthritic change with cystic lesions in the odontoid. No impending fracture from this cystic change evident. Soft tissues and spinal canal: Prevertebral soft tissues and predental space regions are normal. No paraspinous lesions. No cord or canal hematoma is demonstrable on this study. No spinal stenosis evident. Disc levels: There is moderately severe disc space narrowing at C3-4, C5-6, C6-7, C7-T1. There is moderate disc space narrowing at C4-5. There is facet hypertrophy at multiple levels. No disc extrusion. Upper chest:  Visualized lung apices are clear. Other: There is atherosclerotic calcification in each carotid artery. IMPRESSION: CT head: Chronic atrophy with supratentorial small vessel disease, stable. No intracranial mass, hemorrhage, or extra-axial fluid collection. No acute infarct evident. No calvarial fracture. CT maxillofacial: There are fractures of the right orbital wall and right orbital floor. There are fractures of the right anterior, medial, lateral, and posterior maxillary antral walls with displaced fracture fragments in all of these regions. The pterygoid plate on the right remains intact. Posterior maxillary antral wall fracture is superior to the pterygoid plate. There is a mildly displaced fracture of the midportion of the zygomatic arch. The posterior aspect of the right zygomatic arch is displaced mildly laterally compared to the anterior portion. No fractures to the left of midline apparent. Extensive right-sided paranasal sinus disease is noted, most pronounced in the right maxillary antral region. Air tracks from the right maxillary antrum throughout the surrounding face and inferior orbital regions. 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 CT cervical spine: Scoliosis with mild spondylolisthesis at several levels, felt to be due to underlying spondylosis. No fracture. Multilevel osteoarthritic change. Bilateral carotid  artery calcification noted. Electronically Signed   By: Bretta BangWilliam  Woodruff III M.D.   On: 11/13/2016 08:38   Dg Shoulder Left  Result Date: 11/13/2016 CLINICAL DATA:  Pain following fall EXAM: LEFT SHOULDER - 2+ VIEW COMPARISON:  None. FINDINGS: Frontal and Y scapular images were obtained. There is a fracture of the proximal left humerus extending from the head region into the proximal diaphysis laterally with separation of fracture fragments. 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 There is  no frank dislocation, although extensive remodeling in the left shoulder joint is noted. Bones are diffusely osteoporotic. There is separation of the acromioclavicular joint. Visualized left lung is clear. There is aortic atherosclerosis. IMPRESSION: Fracture proximal humerus with displacement of fracture fragments. No frank dislocation. 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 Acromioclavicular separation, likely chronic. Bones diffusely osteoporotic. There is aortic atherosclerosis. Electronically Signed   By: Bretta BangWilliam  Woodruff III M.D.   On: 11/13/2016 08:09   Dg Hand Complete Right  Result Date: 11/13/2016 CLINICAL DATA:  Pain following fall EXAM: RIGHT HAND - COMPLETE 3+ VIEW COMPARISON:  None. FINDINGS: Frontal, oblique, and lateral views obtained. There is an impacted fracture of the distal radial metaphysis. There is a nondisplaced fracture of the proximal portion of the fourth proximal phalanx. There is evidence of an old fracture of the scaphoid bone with scaphotrapezial joint dislocation, felt to be chronic. There is age uncertain dislocation at the second and third MCP joints. 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 There is erosive osteoarthritis in the fourth PIP joint. There is osteoarthritis in all MCP, PIP, and DIP joints. There is advanced osteoporosis. There is extensive chondrocalcinosis in the wrist region proximally. IMPRESSION: Impacted fracture distal radial metaphysis. Nondisplaced fracture proximal aspect fourth proximal phalanx. Old appearing fracture distal scaphoid with chronic appearing scaphotrapezial joint dislocation. 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 Age uncertain dislocation is noted in the second and third metacarpals. Multilevel osteoarthritis with erosive osteoarthritis in the fourth PIP joint. Advanced osteoporosis. Chondrocalcinosis in the proximal radial region may be due either to osteoarthritis or calcium  pyrophosphate deposition disease. Electronically Signed   By: Bretta BangWilliam  Woodruff III M.D.   On: 11/13/2016 08:16   Ct Maxillofacial Wo Contrast  Result Date: 11/13/2016 CLINICAL DATA:  Pain following fall EXAM: CT HEAD WITHOUT CONTRAST CT MAXILLOFACIAL WITHOUT CONTRAST CT CERVICAL SPINE WITHOUT CONTRAST TECHNIQUE: Multidetector CT imaging of the head, cervical spine, and maxillofacial structures were performed using the standard protocol without intravenous contrast. Multiplanar CT image reconstructions of the cervical spine and maxillofacial structures were also generated. COMPARISON:  Head CT and cervical spine CT February 16, 2016 FINDINGS: CT HEAD FINDINGS Brain: There is moderate diffuse atrophy. There is no intracranial mass, hemorrhage, extra-axial fluid collection, or midline shift. There is patchy small vessel disease in the centra semiovale bilaterally. There is mild small vessel disease in each thalamus. There is no new gray-white compartment lesion. No acute infarct evident. Vascular: No hyperdense vessel. There is calcification in both carotid siphon regions. Skull: Bony calvarium appears intact. Other: Mastoid air cells are clear. CT MAXILLOFACIAL FINDINGS Osseous: There is a comminuted fracture of the lateral orbital wall on the right. There is a comminuted orbital floor fracture on the right with depressed fracture fragments. There does not appear to be extraocular muscle entrapment from this depressed orbital floor fracture. There is a comminuted anterior maxillary wall fracture on the right with fracture fragments depressed posteriorly into  the maxillary antral region. There is a displaced fracture along the medial maxillary antrum on the right with a posterior fragment lateral to the anterior fragment. The pterygoid plate on the right remains intact. However, superior to the pterygoid plate on the right, there is a mildly displaced fracture of the posterior wall of the maxillary antrum on the  right. There is a fracture through the midportion of the zygomatic arch on the right. No fractures to the left of midline are demonstrable. There is advanced arthropathy in both temporomandibular joints with erosion of the each mandibular condyle. Orbits: There is soft tissue swelling in the preseptal right orbital region. There is no intraorbital lesion beyond a small amount of air that has tracked from the right maxillary antrum into the inferior right orbit. There is no hemorrhage within either orbit. Sinuses: There is complete opacification of the right maxillary antrum, likely hemorrhage. Hemorrhage tracks into the right near ease, likely through a fracture of the medial maxillary sinus wall on the right. There is obstruction of the ostiomeatal unit complex on the right. There is opacification in several ethmoid air cells bilaterally, more on the right than on the left. There is an air-fluid level in a posterior right ethmoid air cell. Frontal sinuses are clear. There is hemorrhage in the right sphenoid sinus complex with air-fluid level. No appreciable mucosal thickening seen in this area. Soft tissues: There is marked soft tissue swelling over the entire right face with areas of developing hematoma throughout the right facial region. Air tracks into the soft tissues on the right via the complex maxillary sinus region fractures. More inferiorly, soft tissues appear normal. No abscess. Salivary glands appear normal. Visualize pharynx appears unremarkable. No tongue or tongue base lesions evident. CT CERVICAL SPINE FINDINGS Alignment: There is levoscoliosis. There is 2 mm of anterolisthesis of C4 on C5. There is 2 mm of C5 on C6. There is 3 mm of anterolisthesis of C6 on C7. There is 2 mm of anterolisthesis of T1 on T2. Skull base and vertebrae: The skull base and craniocervical junction regions appear normal. There is no appreciable fracture. There are no blastic or lytic bone lesions. There is osteoarthritic  change with cystic lesions in the odontoid. No impending fracture from this cystic change evident. Soft tissues and spinal canal: Prevertebral soft tissues and predental space regions are normal. No paraspinous lesions. No cord or canal hematoma is demonstrable on this study. No spinal stenosis evident. Disc levels: There is moderately severe disc space narrowing at C3-4, C5-6, C6-7, C7-T1. There is moderate disc space narrowing at C4-5. There is facet hypertrophy at multiple levels. No disc extrusion. Upper chest: Visualized lung apices are clear. Other: There is atherosclerotic calcification in each carotid artery. IMPRESSION: CT head: Chronic atrophy with supratentorial small vessel disease, stable. No intracranial mass, hemorrhage, or extra-axial fluid collection. No acute infarct evident. No calvarial fracture. CT maxillofacial: There are fractures of the right orbital wall and right orbital floor. There are fractures of the right anterior, medial, lateral, and posterior maxillary antral walls with displaced fracture fragments in all of these regions. The pterygoid plate on the right remains intact. Posterior maxillary antral wall fracture is superior to the pterygoid plate. There is a mildly displaced fracture of the midportion of the zygomatic arch. The posterior aspect of the right zygomatic arch is displaced mildly laterally compared to the anterior portion. No fractures to the left of midline apparent. Extensive right-sided paranasal sinus disease is noted, most pronounced in  the right maxillary antral region. Air tracks from the right maxillary antrum throughout the surrounding face and inferior orbital regions. 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 CT cervical spine: Scoliosis with mild spondylolisthesis at several levels, felt to be due to underlying spondylosis. No fracture. Multilevel osteoarthritic change. Bilateral carotid artery calcification noted.  Electronically Signed   By: Bretta Bang III M.D.   On: 11/13/2016 08:38        Scheduled Meds: . donepezil  10 mg Oral Daily  . escitalopram  10 mg Oral Daily  . heparin  5,000 Units Subcutaneous Q8H  . levothyroxine  125 mcg Oral QAC breakfast  . loperamide  2 mg Oral BID  . loratadine  10 mg Oral Daily  . losartan  50 mg Oral Daily  . mouth rinse  15 mL Mouth Rinse BID  . memantine  10 mg Oral BID  . sulfamethoxazole-trimethoprim  1 tablet Oral BID   Continuous Infusions: . sodium chloride 75 mL/hr at 11/14/16 1536     LOS: 1 day    Time spent:    Shadawn Hanaway, MD [facility] Pager [phone]  If 7PM-7AM, please contact night-coverage [email or web address] Password [facility] 11/14/2016, 4:41 PM

## 2016-11-15 ENCOUNTER — Inpatient Hospital Stay (HOSPITAL_COMMUNITY): Payer: Medicare Other

## 2016-11-15 DIAGNOSIS — J9601 Acute respiratory failure with hypoxia: Secondary | ICD-10-CM

## 2016-11-15 LAB — URINALYSIS, ROUTINE W REFLEX MICROSCOPIC
Bilirubin Urine: NEGATIVE
GLUCOSE, UA: NEGATIVE mg/dL
Hgb urine dipstick: NEGATIVE
KETONES UR: 5 mg/dL — AB
Leukocytes, UA: NEGATIVE
NITRITE: NEGATIVE
PH: 5 (ref 5.0–8.0)
Protein, ur: NEGATIVE mg/dL
SPECIFIC GRAVITY, URINE: 1.017 (ref 1.005–1.030)

## 2016-11-15 LAB — CBC
HEMATOCRIT: 25.8 % — AB (ref 36.0–46.0)
HEMOGLOBIN: 8.6 g/dL — AB (ref 12.0–15.0)
MCH: 32.3 pg (ref 26.0–34.0)
MCHC: 33.3 g/dL (ref 30.0–36.0)
MCV: 97 fL (ref 78.0–100.0)
Platelets: 190 10*3/uL (ref 150–400)
RBC: 2.66 MIL/uL — ABNORMAL LOW (ref 3.87–5.11)
RDW: 13.4 % (ref 11.5–15.5)
WBC: 13.6 10*3/uL — ABNORMAL HIGH (ref 4.0–10.5)

## 2016-11-15 LAB — IRON AND TIBC
IRON: 23 ug/dL — AB (ref 28–170)
Saturation Ratios: 11 % (ref 10.4–31.8)
TIBC: 206 ug/dL — ABNORMAL LOW (ref 250–450)
UIBC: 183 ug/dL

## 2016-11-15 LAB — BASIC METABOLIC PANEL
ANION GAP: 9 (ref 5–15)
BUN: 26 mg/dL — AB (ref 6–20)
CHLORIDE: 107 mmol/L (ref 101–111)
CO2: 22 mmol/L (ref 22–32)
Calcium: 8.4 mg/dL — ABNORMAL LOW (ref 8.9–10.3)
Creatinine, Ser: 1.07 mg/dL — ABNORMAL HIGH (ref 0.44–1.00)
GFR calc Af Amer: 50 mL/min — ABNORMAL LOW (ref >60)
GFR, EST NON AFRICAN AMERICAN: 43 mL/min — AB (ref >60)
GLUCOSE: 115 mg/dL — AB (ref 65–99)
POTASSIUM: 4.1 mmol/L (ref 3.5–5.1)
Sodium: 138 mmol/L (ref 135–145)

## 2016-11-15 LAB — RETICULOCYTES
RBC.: 2.79 MIL/uL — ABNORMAL LOW (ref 3.87–5.11)
Retic Count, Absolute: 44.6 10*3/uL (ref 19.0–186.0)
Retic Ct Pct: 1.6 % (ref 0.4–3.1)

## 2016-11-15 LAB — FOLATE: Folate: 25.2 ng/mL (ref >5.9)

## 2016-11-15 LAB — FERRITIN: FERRITIN: 1010 ng/mL — AB (ref 11–307)

## 2016-11-15 LAB — VITAMIN B12: Vitamin B-12: 597 pg/mL (ref 180–914)

## 2016-11-15 MED ORDER — HYDROCODONE-ACETAMINOPHEN 5-325 MG PO TABS
1.0000 | ORAL_TABLET | ORAL | Status: DC | PRN
  Administered 2016-11-15 – 2016-11-21 (×9): 1 via ORAL
  Filled 2016-11-15 (×9): qty 1

## 2016-11-15 NOTE — Progress Notes (Addendum)
PT Cancellation Note  Patient Details Name: [name] MRN: [id number] DOB: 06-16-1920   Cancelled Treatment:    Reason Eval/Treat Not Completed: Patient's level of consciousness (Pt is soundly sleeping.  Spoke with pt's son and dtr in law who state she had a rough morning.  Ortho recommendations are for surgery, however no specifics have been set.  Family is awaiting discussion with ortho to weigh pros and cons of surgery.  Will check back on patient tomorrow.)   Beth Atira Borello, PT, DPT X: [phone]

## 2016-11-15 NOTE — Progress Notes (Addendum)
Went into patient's room this morning to recheck BP and temp, as they were out of normal range at 0630. Noted patient to be diaphoretic and tachypnic while sleeping, with respirations counted at 28 per min. Patient became agitated while attempting vitals, and BP noted to be very high, and SPO2 at 84% on room air. After 5 minutes when patient was sleeping again, rechecked BP and it remained high at 163/87, HR 77. Placed on continuous pulse ox and 3LPM oxygen, patient at 89% now, increased to 4LMP. Son at bedside. Will notify MD and continue to monitor.
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
## 2016-11-15 NOTE — Progress Notes (Signed)
PROGRESS NOTE    [name]  ZOX:[id number] DOB: February 02, 1920 DOA: 11/13/2016 PCP: [name], MD    Brief Narrative:  83 y/o female resident of ALF, was brought to ED after a fall. She sustained several injuries as described below. Orthopedics is evaluating for bilateral upper extremity fractures. She was also dehydrated with AKI, which has improved with IV fluids. At this time, she has developed acute respiratory failure with mild fever and is undergoing further work up. Will likely need SNF on discharge when she is ready..    Assessment & Plan:   Active Problems:   Essential hypertension   Hypothyroidism   Anemia   Fever   Dementia   Acute respiratory failure with hypoxia (HCC)   CKD (chronic kidney disease) stage 3, GFR 30-59 ml/min   AKI (acute kidney injury) (HCC)   Fall   Right orbital fracture (HCC)   Right maxillary fracture (HCC)   Closed fracture of left proximal humerus   Right radial fracture   Laceration of right ring finger without foreign body without damage to nail   Closed nondisplaced fracture of proximal phalanx of right ring finger    Right orbital fracture/right maxillary fracture. Case was discussed by Dr.Mesner with Dr. Annalee Genta on call for ENT. It was felt that patient's injuries were nonoperative and the patient should follow-up with ENT in the office in 10 days. Soft diet was recommended. It was advised the patient not blow her nose.  Right radial fracture/left humerus fracture. Currently in sling/splint. Orthopedics has been consulted.  Chronic kidney disease stage III with acute kidney injury. Suspect is related to volume depletion. Improved with IV fluids.  Acute respiratory failure with hypoxia. Patient was admitted to the hospital on room air. Overnight, she appears to have an increasing oxygen requirement and was hypoxic this morning. Currently on 4L. Will check chest xray. She is also mildly febrile. Will check  urinalysis.  Hypothyroidism. Continue Synthroid  Hypertension. Stable. Continue outpatient regimen  Fall. The patient has sustained several injuries. Case was discussed with Dr. Dwain Sarna on call for trauma surgery who agreed with keeping the patient at [facility] . Physical therapy has been consulted. She has been receiving IV morphine for pain control, but due to excess sedation, will change to po meds. She will likely need a higher level of care on discharge.  Anemia. Patient has had a significant decline in hemoglobin. She has had some bleeding from facial injuries. There is also likely an element of hemodilution, since she presented with dehydration. Check anemia panel. Continue to monitor.  Dementia.     DVT prophylaxis: heparin Code Status: DNR, if she fails to improve, family would want to consider comfort measures Family Communication: discussed with sons at the bedside Disposition Plan: will likely need SNF   Consultants:   orthopedics  Procedures:     Antimicrobials:      Subjective: Sleeping on arrival, wakes up to voice/pain.  Objective: Vitals:   11/15/16 0651 11/15/16 0810 11/15/16 0832 11/15/16 0835  BP: (!) 61/52 (!) 160/85 (!) 163/87   Pulse: 94 77 75   Resp: 20 (!) 28 (!) 24   Temp: (!) 100.5 F (38.1 C) 99.3 F (37.4 C)    TempSrc: Axillary Axillary    SpO2: 97% (!) 84% (!) 88% 91%  Weight:      Height:        Intake/Output Summary (Last 24 hours) at 11/15/16 1023 Last data filed at 11/14/16 1900  Gross per 24 hour  Intake          2061.25 ml  Output                0 ml  Net          2061.25 ml   Filed Weights   11/13/16 0646 11/13/16 1227  Weight: 46.3 kg (102 lb) 46.4 kg (102 lb 4.8 oz)    Examination:  General exam: Appears calm and comfortable  Respiratory system: bilateral rhonchi. Respiratory effort normal. Cardiovascular system: S1 & S2 heard, RRR. No JVD, murmurs, rubs, gallops or clicks. No pedal  edema. Gastrointestinal system: Abdomen is nondistended, soft and nontender. No organomegaly or masses felt. Normal bowel sounds heard. Central nervous system: confused, wakes up to voice. No focal neurological deficits. Extremities: bilateral upper extremities in sling/ splint. Skin: ecchymosis over right face with swelling Psychiatry: confused    Data Reviewed: I have personally reviewed following labs and imaging studies  CBC:  Recent Labs Lab 11/13/16 0705 11/14/16 0431 11/15/16 0606  WBC 14.0* 11.0* 13.6*  NEUTROABS 9.0*  --   --   HGB 11.9* 8.8* 8.6*  HCT 35.3* 25.8* 25.8*  MCV 96.7 95.9 97.0  PLT 230 188 190   Basic Metabolic Panel:  Recent Labs Lab 11/13/16 0705 11/14/16 0431 11/15/16 0606  NA 134* 137 138  K 4.5 4.1 4.1  CL 102 107 107  CO2 24 24 22   GLUCOSE 110* 138* 115*  BUN 30* 29* 26*  CREATININE 1.72* 1.20* 1.07*  CALCIUM 9.2 8.3* 8.4*   GFR: Estimated Creatinine Clearance: 23 mL/min (A) (by C-G formula based on SCr of 1.07 mg/dL (H)). Liver Function Tests: No results for input(s): AST, ALT, ALKPHOS, BILITOT, PROT, ALBUMIN in the last 168 hours. No results for input(s): LIPASE, AMYLASE in the last 168 hours. No results for input(s): AMMONIA in the last 168 hours. Coagulation Profile: No results for input(s): INR, PROTIME in the last 168 hours. Cardiac Enzymes: No results for input(s): CKTOTAL, CKMB, CKMBINDEX, TROPONINI in the last 168 hours. BNP (last 3 results) No results for input(s): PROBNP in the last 8760 hours. HbA1C: No results for input(s): HGBA1C in the last 72 hours. CBG: No results for input(s): GLUCAP in the last 168 hours. Lipid Profile: No results for input(s): CHOL, HDL, LDLCALC, TRIG, CHOLHDL, LDLDIRECT in the last 72 hours. Thyroid Function Tests: No results for input(s): TSH, T4TOTAL, FREET4, T3FREE, THYROIDAB in the last 72 hours. Anemia Panel: No results for input(s): VITAMINB12, FOLATE, FERRITIN, TIBC, IRON, RETICCTPCT  in the last 72 hours. Sepsis Labs: No results for input(s): PROCALCITON, LATICACIDVEN in the last 168 hours.  Recent Results (from the past 240 hour(s))  MRSA PCR Screening     Status: None   Collection Time: 11/13/16 11:58 AM  Result Value Ref Range Status   MRSA by PCR NEGATIVE NEGATIVE Final    Comment:        The GeneXpert MRSA Assay (FDA approved for NASAL specimens only), is one component of a comprehensive MRSA colonization surveillance program. It is not intended to diagnose MRSA infection nor to guide or monitor treatment for MRSA infections.          Radiology Studies: No results found.      Scheduled Meds: . donepezil  10 mg Oral Daily  . escitalopram  10 mg Oral Daily  . heparin  5,000 Units Subcutaneous Q8H  . levothyroxine  125 mcg Oral QAC breakfast  . loperamide  2  mg Oral BID  . loratadine  10 mg Oral Daily  . losartan  50 mg Oral Daily  . mouth rinse  15 mL Mouth Rinse BID  . memantine  10 mg Oral BID  . sulfamethoxazole-trimethoprim  1 tablet Oral BID   Continuous Infusions:    LOS: 2 days    Time spent:    Abriana Saltos, MD [facility] Pager [phone]  If 7PM-7AM, please contact night-coverage [email or web address] Password [facility] 11/15/2016, 10:23 AM

## 2016-11-16 LAB — BASIC METABOLIC PANEL
Anion gap: 7 (ref 5–15)
BUN: 32 mg/dL — ABNORMAL HIGH (ref 6–20)
CHLORIDE: 106 mmol/L (ref 101–111)
CO2: 25 mmol/L (ref 22–32)
Calcium: 8.5 mg/dL — ABNORMAL LOW (ref 8.9–10.3)
Creatinine, Ser: 1.19 mg/dL — ABNORMAL HIGH (ref 0.44–1.00)
GFR calc non Af Amer: 38 mL/min — ABNORMAL LOW (ref >60)
GFR, EST AFRICAN AMERICAN: 44 mL/min — AB (ref >60)
Glucose, Bld: 110 mg/dL — ABNORMAL HIGH (ref 65–99)
Potassium: 3.9 mmol/L (ref 3.5–5.1)
SODIUM: 138 mmol/L (ref 135–145)

## 2016-11-16 LAB — CBC
HEMATOCRIT: 25.8 % — AB (ref 36.0–46.0)
Hemoglobin: 8.7 g/dL — ABNORMAL LOW (ref 12.0–15.0)
MCH: 32.7 pg (ref 26.0–34.0)
MCHC: 33.7 g/dL (ref 30.0–36.0)
MCV: 97 fL (ref 78.0–100.0)
Platelets: 180 10*3/uL (ref 150–400)
RBC: 2.66 MIL/uL — AB (ref 3.87–5.11)
RDW: 13.3 % (ref 11.5–15.5)
WBC: 15.1 10*3/uL — AB (ref 4.0–10.5)

## 2016-11-16 NOTE — Progress Notes (Signed)
PROGRESS NOTE    [name]  ZOX:[id number] DOB: Jul 31, 1920 DOA: 11/13/2016 PCP: [name], MD    Brief Narrative:  83 y/o female resident of ALF, was brought to ED after a fall. She sustained several injuries as described below. Orthopedics is evaluating for bilateral upper extremity fractures. She was also dehydrated with AKI, which has improved with IV fluids.Will likely need SNF on discharge when she is ready..    Assessment & Plan:   Active Problems:   Essential hypertension   Hypothyroidism   Anemia   Fever   Dementia   Acute respiratory failure with hypoxia (HCC)   CKD (chronic kidney disease) stage 3, GFR 30-59 ml/min   AKI (acute kidney injury) (HCC)   Fall   Right orbital fracture (HCC)   Right maxillary fracture (HCC)   Closed fracture of left proximal humerus   Right radial fracture   Laceration of right ring finger without foreign body without damage to nail   Closed nondisplaced fracture of proximal phalanx of right ring finger    Right orbital fracture/right maxillary fracture. Case was discussed by Dr.Mesner with Dr. Annalee Genta on call for ENT. It was felt that patient's injuries were nonoperative and the patient should follow-up with ENT in the office in 10 days. Soft diet was recommended. It was advised the patient not blow her nose.pain well controlled.   Right radial fracture/left humerus fracture. Currently in sling/splint. Orthopedics has been consulted and recommended non operative management. Pain control.   Chronic kidney disease stage III with acute kidney injury. Suspect is related to volume depletion. Improved with IV fluids.   Acute respiratory failure with hypoxia. Patient was admitted to the hospital on room air.  On the night of 3/ 26 pt had some respiratory distress, and was put on 4 lit of [place] oxygen, currently off oxygen. Afebrile , but worsening wbc count.   Hypothyroidism. Continue Synthroid  Hypertension. Stable. Continue  outpatient regimen  Fall. The patient has sustained several injuries. Case was discussed with Dr. Dwain Sarna on call for trauma surgery who agreed with keeping the patient at [facility] . Physical therapy has been consulted. She has been receiving IV morphine for pain control, but due to excess sedation, will change to po meds. She will likely need a higher level of care on discharge.   Anemia. Patient has had a significant decline in hemoglobin. She has had some bleeding from facial injuries. There is also likely an element of hemodilution, since she presented with dehydration. Check anemia panel. Continue to monitor.   Dementia. No agitation. Stable.      DVT prophylaxis: heparin Code Status: DNR, if she fails to improve, family would want to consider comfort measures Family Communication: discussed with son at the bedside Disposition Plan: will likely need SNF   Consultants:   orthopedics  Procedures:   None   Antimicrobials:   None.    Subjective: Alert and awake , no pain or complaints.   Objective: Vitals:   11/15/16 2058 11/15/16 2253 11/16/16 0518 11/16/16 1318  BP:  130/65 (!) 120/58 140/90  Pulse:  (!) 59 81 78  Resp:  15 17 18   Temp:  98.6 F (37 C) 99.3 F (37.4 C) 99.2 F (37.3 C)  TempSrc:  Axillary Axillary Axillary  SpO2: 98% 100% 95% 95%  Weight:      Height:        Intake/Output Summary (Last 24 hours) at 11/16/16 1429 Last data filed at 11/16/16  1300  Gross per 24 hour  Intake              410 ml  Output                0 ml  Net              410 ml   Filed Weights   11/13/16 0646 11/13/16 1227  Weight: 46.3 kg (102 lb) 46.4 kg (102 lb 4.8 oz)    Examination:  General exam: Appears calm and comfortable  Respiratory system: bilateral rhonchi. Respiratory effort normal. Cardiovascular system: S1 & S2 heard, RRR. No JVD, murmurs, rubs, gallops or clicks. No pedal edema. Gastrointestinal system: Abdomen is nondistended, soft and  nontender. No organomegaly or masses felt. Normal bowel sounds heard. Central nervous system: confused,  Alert and awake and answering simple questions. No focal neurological deficits. Extremities: bilateral upper extremities in sling/ splint. Skin: ecchymosis over right face with swelling Psychiatry: confused    Data Reviewed: I have personally reviewed following labs and imaging studies  CBC:  Recent Labs Lab 11/13/16 0705 11/14/16 0431 11/15/16 0606 11/16/16 0612  WBC 14.0* 11.0* 13.6* 15.1*  NEUTROABS 9.0*  --   --   --   HGB 11.9* 8.8* 8.6* 8.7*  HCT 35.3* 25.8* 25.8* 25.8*  MCV 96.7 95.9 97.0 97.0  PLT 230 188 190 180   Basic Metabolic Panel:  Recent Labs Lab 11/13/16 0705 11/14/16 0431 11/15/16 0606 11/16/16 0612  NA 134* 137 138 138  K 4.5 4.1 4.1 3.9  CL 102 107 107 106  CO2 24 24 22 25   GLUCOSE 110* 138* 115* 110*  BUN 30* 29* 26* 32*  CREATININE 1.72* 1.20* 1.07* 1.19*  CALCIUM 9.2 8.3* 8.4* 8.5*   GFR: Estimated Creatinine Clearance: 20.7 mL/min (A) (by C-G formula based on SCr of 1.19 mg/dL (H)). Liver Function Tests: No results for input(s): AST, ALT, ALKPHOS, BILITOT, PROT, ALBUMIN in the last 168 hours. No results for input(s): LIPASE, AMYLASE in the last 168 hours. No results for input(s): AMMONIA in the last 168 hours. Coagulation Profile: No results for input(s): INR, PROTIME in the last 168 hours. Cardiac Enzymes: No results for input(s): CKTOTAL, CKMB, CKMBINDEX, TROPONINI in the last 168 hours. BNP (last 3 results) No results for input(s): PROBNP in the last 8760 hours. HbA1C: No results for input(s): HGBA1C in the last 72 hours. CBG: No results for input(s): GLUCAP in the last 168 hours. Lipid Profile: No results for input(s): CHOL, HDL, LDLCALC, TRIG, CHOLHDL, LDLDIRECT in the last 72 hours. Thyroid Function Tests: No results for input(s): TSH, T4TOTAL, FREET4, T3FREE, THYROIDAB in the last 72 hours. Anemia Panel:  Recent Labs   11/15/16 1303  VITAMINB12 597  FOLATE 25.2  FERRITIN 1,010*  TIBC 206*  IRON 23*  RETICCTPCT 1.6   Sepsis Labs: No results for input(s): PROCALCITON, LATICACIDVEN in the last 168 hours.  Recent Results (from the past 240 hour(s))  MRSA PCR Screening     Status: None   Collection Time: 11/13/16 11:58 AM  Result Value Ref Range Status   MRSA by PCR NEGATIVE NEGATIVE Final    Comment:        The GeneXpert MRSA Assay (FDA approved for NASAL specimens only), is one component of a comprehensive MRSA colonization surveillance program. It is not intended to diagnose MRSA infection nor to guide or monitor treatment for MRSA infections.          Radiology Studies: Dg Chest  Port 1 View  Result Date: 11/15/2016 CLINICAL DATA:  Acute respiratory failure. Short of breath and pneumonia. Fever EXAM: PORTABLE CHEST 1 VIEW COMPARISON:  11/13/2016 FINDINGS: Heart size upper normal. Negative for heart failure. Mild bibasilar airspace disease and small pleural effusions have developed since the prior study. Mild vascular congestion has developed since the prior study. Displaced fracture proximal left humerus unchanged. Severe degenerative change in the shoulder joint bilaterally. IMPRESSION: Pulmonary vascular congestion with bilateral effusions and bibasilar airspace disease most compatible with congestive heart failure. Left humeral fracture Electronically Signed   By: Marlan Palau M.D.   On: 11/15/2016 11:19        Scheduled Meds: . donepezil  10 mg Oral Daily  . escitalopram  10 mg Oral Daily  . heparin  5,000 Units Subcutaneous Q8H  . levothyroxine  125 mcg Oral QAC breakfast  . loperamide  2 mg Oral BID  . loratadine  10 mg Oral Daily  . losartan  50 mg Oral Daily  . mouth rinse  15 mL Mouth Rinse BID  . memantine  10 mg Oral BID   Continuous Infusions:    LOS: 3 days    Time spent:    Daanya Lanphier, MD [facility] Pager [phone]  If 7PM-7AM,  please contact night-coverage [email or web address] Password [facility] 11/16/2016, 2:29 PM

## 2016-11-16 NOTE — Progress Notes (Signed)
PT Cancellation Note  Patient Details Name: [name] MRN: [id number] DOB: 19-Jan-1920   Cancelled Treatment:    Reason Eval/Treat Not Completed: Patient's level of consciousness (Pt is sleeping very soundly at this point, and son states that she has been worn out after receiving a bath earlier today.  Alice RiegerOrth note has been corrected, and she is not a surgical candidate.  NWB B UE's.  Will attempt back tomorrow AM.  )   Waynetta SandyBeth Antaniya Venuti, PT, DPT X: [phone]

## 2016-11-17 ENCOUNTER — Inpatient Hospital Stay (HOSPITAL_COMMUNITY): Payer: Medicare Other

## 2016-11-17 DIAGNOSIS — F338 Other recurrent depressive disorders: Secondary | ICD-10-CM | POA: Diagnosis not present

## 2016-11-17 DIAGNOSIS — F419 Anxiety disorder, unspecified: Secondary | ICD-10-CM | POA: Diagnosis not present

## 2016-11-17 DIAGNOSIS — E039 Hypothyroidism, unspecified: Secondary | ICD-10-CM | POA: Diagnosis not present

## 2016-11-17 DIAGNOSIS — F0390 Unspecified dementia without behavioral disturbance: Secondary | ICD-10-CM | POA: Diagnosis not present

## 2016-11-17 DIAGNOSIS — I10 Essential (primary) hypertension: Secondary | ICD-10-CM | POA: Diagnosis not present

## 2016-11-17 DIAGNOSIS — D649 Anemia, unspecified: Secondary | ICD-10-CM | POA: Diagnosis not present

## 2016-11-17 LAB — URINALYSIS, ROUTINE W REFLEX MICROSCOPIC
BILIRUBIN URINE: NEGATIVE
Bacteria, UA: NONE SEEN
Glucose, UA: NEGATIVE mg/dL
Hgb urine dipstick: NEGATIVE
KETONES UR: NEGATIVE mg/dL
Leukocytes, UA: NEGATIVE
NITRITE: NEGATIVE
PH: 5 (ref 5.0–8.0)
Protein, ur: 30 mg/dL — AB
Specific Gravity, Urine: 1.025 (ref 1.005–1.030)

## 2016-11-17 MED ORDER — VANCOMYCIN HCL 500 MG IV SOLR: 500.0000 mg | INTRAVENOUS | Status: DC

## 2016-11-17 MED ORDER — VANCOMYCIN HCL IN DEXTROSE 1-5 GM/200ML-% IV SOLN
1000.0000 mg | Freq: Once | INTRAVENOUS | Status: AC
  Administered 2016-11-17: 1000 mg via INTRAVENOUS
  Filled 2016-11-17: qty 200

## 2016-11-17 MED ORDER — LEVOFLOXACIN IN D5W 500 MG/100ML IV SOLN
500.0000 mg | INTRAVENOUS | Status: DC
  Administered 2016-11-17 – 2016-11-19 (×2): 500 mg via INTRAVENOUS
  Filled 2016-11-17 (×2): qty 100

## 2016-11-17 MED ORDER — CLINDAMYCIN HCL 150 MG PO CAPS: 300.0000 mg | ORAL_CAPSULE | Freq: Three times a day (TID) | ORAL | Status: DC

## 2016-11-17 NOTE — Evaluation (Signed)
Occupational Therapy Evaluation Patient Details Name: [name] MRN: [id number] DOB: 12-May-1920 Today's Date: 11/17/2016    History of Present Illness 83 y/o female resident of ALF, was brought to ED after a fall. Pt with bilateral UE fractures.  Pt with fx L humerus with L sling and R radial andring finger fx.  Pt NWB BUE.   Clinical Impression   Pt admitted s/p fall with BUE fractures. Pt currently with functional limitations due to the deficits listed below (see OT Problem List).  Pt will benefit from skilled OT to increase their safety and independence with ADL and functional mobility for ADL to facilitate discharge to venue listed below.      Follow Up Recommendations  SNF    Equipment Recommendations  None recommended by OT    Recommendations for Other Services       Precautions / Restrictions Restrictions Weight Bearing Restrictions: Yes RUE Weight Bearing: Non weight bearing LUE Weight Bearing: Non weight bearing      Mobility Bed Mobility Overal bed mobility: Needs Assistance Bed Mobility: Supine to Sit     Supine to sit: +2 for physical assistance;+2 for safety/equipment;Total assist     General bed mobility comments: pt VERY fearful of falling even with 2 person A and reassurance  Transfers                 General transfer comment: did not perform    Balance Overall balance assessment: History of Falls;Needs assistance Sitting-balance support: No upper extremity supported Sitting balance-Leahy Scale: Fair   Postural control: Posterior lean                                 ADL either performed or assessed with clinical judgement   ADL Overall ADL's : Needs assistance/impaired Eating/Feeding: Bed level;Maximal assistance   Grooming: Maximal assistance;Sitting   Upper Body Bathing: Total assistance;Bed level   Lower Body Bathing: Total assistance;Bed level                         General ADL Comments: pt  basically max - total A with ADL activity due to BUE fractures     Vision Patient Visual Report: No change from baseline              Pertinent Vitals/Pain Pain Assessment: Faces Faces Pain Scale: Hurts even more Pain Location: with moving to EOB. Pt did raise voice in pain but did not localize pain to certain location Pain Intervention(s): Limited activity within patient's tolerance;Monitored during session;Repositioned        Extremity/Trunk Assessment Upper Extremity Assessment Upper Extremity Assessment: RUE deficits/detail;LUE deficits/detail RUE Deficits / Details: Pt with R radius fx and R ring finger fx. Pt is NWB on R LUE Deficits / Details: Pt with L humeral fx with sling.  Pt is NWB   Lower Extremity Assessment Lower Extremity Assessment: Defer to PT evaluation       Communication     Cognition Arousal/Alertness: Awake/alert Behavior During Therapy: Agitated Overall Cognitive Status: No family/caregiver present to determine baseline cognitive functioning Area of Impairment: Orientation;Memory;Awareness;Problem solving                 Orientation Level: Disoriented to;Place;Time;Situation                 General Comments   obtained large sling for pt and educated CNA on positioning  Home Living Family/patient expects to be discharged to:: Skilled nursing facility                                                 OT Problem List: Decreased strength;Decreased range of motion;Decreased activity tolerance;Decreased knowledge of use of DME or AE;Impaired UE functional use;Pain;Impaired balance (sitting and/or standing);Decreased cognition;Decreased safety awareness      OT Treatment/Interventions: Self-care/ADL training;DME and/or AE instruction;Patient/family education    OT Goals(Current goals can be found in the care plan section) Acute Rehab OT Goals Patient Stated Goal: did not state  OT Frequency: Min  3X/week              End of Session Equipment Utilized During Treatment: Other (comment) (obtained sling that fit for LUE. sling on LUe was too small) Nurse Communication: Mobility status;Weight bearing status;Precautions;Other (comment) (positioning of BUE in bed)  Activity Tolerance: Patient tolerated treatment well Patient left: in bed;with bed alarm set  OT Visit Diagnosis: Muscle weakness (generalized) (M62.81);Repeated falls (R29.6);Pain;Unsteadiness on feet (R26.81);History of falling (Z91.81);Feeding difficulties (R63.3);Other abnormalities of gait and mobility (R26.89)                Time: [id number] OT Time Calculation (min): 30 min Charges:  OT General Charges $OT Visit: 1 Procedure OT Evaluation $OT Eval Moderate Complexity: 1 Procedure OT Treatments $Self Care/Home Management : 8-22 mins G-Codes:     Lise AuerLori Tyreik Delahoussaye, OT [phone]  Einar CrowEDDING, [name] D 11/17/2016, 10:09 AM

## 2016-11-17 NOTE — Care Management Note (Signed)
Case Management Note  Patient Details  Name: [name] MRN: [id number] Date of Birth: [birth year]  Subjective/Objective:                  Pt admitted with multiple fx. She is from [facility]. PT eval pending but DC to SNF anticipated. CSW following.  Action/Plan: CSW will make arrangements for facility placement.   Expected Discharge Date:    11/17/2016              Expected Discharge Plan:  Skilled Nursing Facility  In-House Referral:  Clinical Social Work  Discharge planning Services  CM Consult  Post Acute Care Choice:  NA Choice offered to:  NA  Status of Service:  Completed, signed off  Malcolm MetroChildress, Hideo Googe Demske, RN 11/17/2016, 10:13 AM

## 2016-11-17 NOTE — Progress Notes (Signed)
PROGRESS NOTE    [name]  WUJ:[id number] DOB: 08-29-1919 DOA: 11/13/2016 PCP: [name], MD    Brief Narrative:  83 y/o female resident of ALF, was brought to ED after a fall. She sustained several injuries as described below. Orthopedics is evaluating for bilateral upper extremity fractures. She was also dehydrated with AKI, which has improved with IV fluids.Will likely need SNF on discharge when she is ready..    Assessment & Plan:   Active Problems:   Essential hypertension   Hypothyroidism   Anemia   Fever   Dementia   Acute respiratory failure with hypoxia (HCC)   CKD (chronic kidney disease) stage 3, GFR 30-59 ml/min   AKI (acute kidney injury) (HCC)   Fall   Right orbital fracture (HCC)   Right maxillary fracture (HCC)   Closed fracture of left proximal humerus   Right radial fracture   Laceration of right ring finger without foreign body without damage to nail   Closed nondisplaced fracture of proximal phalanx of right ring finger    Right orbital fracture/right maxillary fracture. Case was discussed by Dr.Mesner with Dr. Annalee Genta on call for ENT. It was felt that patient's injuries were nonoperative and the patient should follow-up with ENT in the office in 10 days. Soft diet was recommended. It was advised the patient not blow her nose.pain well controlled.   Right radial fracture/left humerus fracture. Currently in sling/splint. Orthopedics has been consulted and recommended non operative management. Pain control. And recommended follow up with ortho in 10 days as outpatient. PT eval recommended SNF placement.   Chronic kidney disease stage III with acute kidney injury. Suspect is related to volume depletion. Improved with IV fluids.   Acute respiratory failure with hypoxia. Patient was admitted to the hospital on room air.  On the night of 3/ 26 pt had some respiratory distress, and was put on 4 lit of [place] oxygen, currently off oxygen. , . But she has  low grade fevers, and wbc count is getting worse. Repeat CXR and UA ordered.   Hypothyroidism. Continue Synthroid  Hypertension. Stable. Continue outpatient regimen  Fall. The patient has sustained several injuries. Case was discussed with Dr. Dwain Sarna on call for trauma surgery who agreed with keeping the patient at [facility] . Physical therapy has been consulted. She has been receiving IV morphine for pain control, but due to excess sedation, will change to po meds. She will likely need a higher level of care on discharge.   Anemia. Patient has had a significant decline in hemoglobin. She has had some bleeding from facial injuries. There is also likely an element of hemodilution, since she presented with dehydration. Anemia panel shows elevated ferritin.. Continue to monitor.   Dementia. No agitation. Stable.   Low grade fevers and worsening wbc count: CXR shows asymmetric right air space disease. Will get SLP evaluation.        DVT prophylaxis: heparin Code Status: DNR, if she fails to improve, family would want to consider comfort measures Family Communication: discussed with son at the bedside Disposition Plan: will likely need SNF   Consultants:   orthopedics  Procedures:   None   Antimicrobials:   None.    Subjective: Alert and awake , no pain or complaints.   Objective: Vitals:   11/16/16 1318 11/16/16 2300 11/17/16 0047 11/17/16 0501  BP: 140/90 (!) 154/55  139/62  Pulse: 78 80  70  Resp: 18 18  18   Temp: 99.2  F (37.3 C) 100.2 F (37.9 C) 98.8 F (37.1 C) 100.3 F (37.9 C)  TempSrc: Axillary Axillary Axillary Axillary  SpO2: 95% 93%  96%  Weight:      Height:        Intake/Output Summary (Last 24 hours) at 11/17/16 1352 Last data filed at 11/17/16 0700  Gross per 24 hour  Intake              240 ml  Output                0 ml  Net              240 ml   Filed Weights   11/13/16 0646 11/13/16 1227  Weight: 46.3 kg (102 lb) 46.4 kg  (102 lb 4.8 oz)    Examination:  General exam: Appears calm and comfortable  Respiratory system: bilateral rhonchi.  Diminished at bases. Respiratory effort normal. Cardiovascular system: S1 & S2 heard, RRR. No JVD, murmurs, rubs, gallops or clicks. No pedal edema. Gastrointestinal system: Abdomen is nondistended, soft and nontender. No organomegaly or masses felt. Normal bowel sounds heard. Central nervous system: confused,  Alert and awake and answering simple questions. No focal neurological deficits. Extremities: bilateral upper extremities in sling/ splint. Skin: ecchymosis over right face with swelling Psychiatry: confused    Data Reviewed: I have personally reviewed following labs and imaging studies  CBC:  Recent Labs Lab 11/13/16 0705 11/14/16 0431 11/15/16 0606 11/16/16 0612  WBC 14.0* 11.0* 13.6* 15.1*  NEUTROABS 9.0*  --   --   --   HGB 11.9* 8.8* 8.6* 8.7*  HCT 35.3* 25.8* 25.8* 25.8*  MCV 96.7 95.9 97.0 97.0  PLT 230 188 190 180   Basic Metabolic Panel:  Recent Labs Lab 11/13/16 0705 11/14/16 0431 11/15/16 0606 11/16/16 0612  NA 134* 137 138 138  K 4.5 4.1 4.1 3.9  CL 102 107 107 106  CO2 24 24 22 25   GLUCOSE 110* 138* 115* 110*  BUN 30* 29* 26* 32*  CREATININE 1.72* 1.20* 1.07* 1.19*  CALCIUM 9.2 8.3* 8.4* 8.5*   GFR: Estimated Creatinine Clearance: 20.7 mL/min (A) (by C-G formula based on SCr of 1.19 mg/dL (H)). Liver Function Tests: No results for input(s): AST, ALT, ALKPHOS, BILITOT, PROT, ALBUMIN in the last 168 hours. No results for input(s): LIPASE, AMYLASE in the last 168 hours. No results for input(s): AMMONIA in the last 168 hours. Coagulation Profile: No results for input(s): INR, PROTIME in the last 168 hours. Cardiac Enzymes: No results for input(s): CKTOTAL, CKMB, CKMBINDEX, TROPONINI in the last 168 hours. BNP (last 3 results) No results for input(s): PROBNP in the last 8760 hours. HbA1C: No results for input(s): HGBA1C in  the last 72 hours. CBG: No results for input(s): GLUCAP in the last 168 hours. Lipid Profile: No results for input(s): CHOL, HDL, LDLCALC, TRIG, CHOLHDL, LDLDIRECT in the last 72 hours. Thyroid Function Tests: No results for input(s): TSH, T4TOTAL, FREET4, T3FREE, THYROIDAB in the last 72 hours. Anemia Panel:  Recent Labs  11/15/16 1303  VITAMINB12 597  FOLATE 25.2  FERRITIN 1,010*  TIBC 206*  IRON 23*  RETICCTPCT 1.6   Sepsis Labs: No results for input(s): PROCALCITON, LATICACIDVEN in the last 168 hours.  Recent Results (from the past 240 hour(s))  MRSA PCR Screening     Status: None   Collection Time: 11/13/16 11:58 AM  Result Value Ref Range Status   MRSA by PCR NEGATIVE NEGATIVE Final  Comment:        The GeneXpert MRSA Assay (FDA approved for NASAL specimens only), is one component of a comprehensive MRSA colonization surveillance program. It is not intended to diagnose MRSA infection nor to guide or monitor treatment for MRSA infections.          Radiology Studies: No results found.      Scheduled Meds: . donepezil  10 mg Oral Daily  . escitalopram  10 mg Oral Daily  . heparin  5,000 Units Subcutaneous Q8H  . levothyroxine  125 mcg Oral QAC breakfast  . loperamide  2 mg Oral BID  . loratadine  10 mg Oral Daily  . losartan  50 mg Oral Daily  . mouth rinse  15 mL Mouth Rinse BID  . memantine  10 mg Oral BID   Continuous Infusions:    LOS: 4 days    Time spent:    Nakenya Theall, MD [facility] Pager [phone]  If 7PM-7AM, please contact night-coverage [email or web address] Password [email or web address] 11/17/2016, 1:52 PM

## 2016-11-17 NOTE — Progress Notes (Signed)
Patient ID: [name], female   DOB: 13-May-1920, 83 y.o.   MRN: [id number] ORTHO FU IN 2 WEEKS

## 2016-11-17 NOTE — Evaluation (Signed)
Physical Therapy Evaluation Patient Details Name: [name] MRN: [id number] DOB: July 06, 1920 Today's Date: 11/17/2016   History of Present Illness  83 y/o female resident of ALF, was brought to ED after a fall. Pt with bilateral UE fractures.  Pt with fx L humerus with L sling and R radial andring finger fx.  Pt NWB BUE.  Clinical Impression  Pt received in bed, and was marginally agreeable to PT evaluation.  She normally is able to ambulate with a RW, however she requires assistance for dressing and bathing.  Today, pt required total A for all functional mobility including bed mobility due to being NWB on B UE's.  She was able to perform a squat pivot transfer from the bed<>chair with Max/Total A.  Recommend that she d/c to SNF at this point.      Follow Up Recommendations SNF    Equipment Recommendations  None recommended by PT    Recommendations for Other Services       Precautions / Restrictions Precautions Precautions: Fall Restrictions Weight Bearing Restrictions: Yes RUE Weight Bearing: Non weight bearing LUE Weight Bearing: Non weight bearing      Mobility  Bed Mobility Overal bed mobility: Needs Assistance Bed Mobility: Supine to Sit     Supine to sit: Total assist;HOB elevated     General bed mobility comments: Bed pad used to cradle pt's trunk and slide hips to the EOB.    Transfers Overall transfer level: Needs assistance   Transfers: Squat Pivot Transfers;Sit to/from Stand     Squat pivot transfers: Total assist;Max assist     General transfer comment: gait belt used and pt was able to perform one sit<>stand, and then squat pivot from the bed<>chair.  Maxi move sling positioned behind her in the chair.    Ambulation/Gait Ambulation/Gait assistance:  (NA due to inability to bear weight through either arm. )              Stairs            Wheelchair Mobility    Modified Rankin (Stroke Patients Only)       Balance Overall balance  assessment: History of Falls;Needs assistance Sitting-balance support: No upper extremity supported;Feet supported Sitting balance-Leahy Scale: Fair Sitting balance - Comments: Demonstrates posterior lean, as well as lean to the right at times.  Pt was able to perform anterior weight shift with cues to do so.   Postural control: Posterior lean Standing balance support: No upper extremity supported                                 Pertinent Vitals/Pain Pain Assessment: Faces Faces Pain Scale: Hurts whole lot Pain Location: With mobility.  Pain Descriptors / Indicators: Sharp Pain Intervention(s): Limited activity within patient's tolerance;Monitored during session;Repositioned    Home Living Family/patient expects to be discharged to:: Skilled nursing facility Living Arrangements: Other (Comment) (Pt is from [facility].)               Additional Comments: Per chart review, pt is normally ambulatory with a RW, but requires assistance for ADL's     Prior Function Level of Independence: Needs assistance   Gait / Transfers Assistance Needed: Ambulates with a RW at baseline.             Hand Dominance   Dominant Hand: Right    Extremity/Trunk Assessment   Upper Extremity Assessment Upper Extremity Assessment:  RUE deficits/detail;LUE deficits/detail RUE Deficits / Details: Pt with R radius fx and R ring finger fx. Pt is NWB on R LUE Deficits / Details: Pt with L humeral fx with sling.  Pt is NWB    Lower Extremity Assessment Lower Extremity Assessment: Generalized weakness       Communication   Communication: No difficulties  Cognition Arousal/Alertness: Awake/alert Behavior During Therapy: Agitated Overall Cognitive Status: No family/caregiver present to determine baseline cognitive functioning Area of Impairment: Orientation;Memory;Awareness;Problem solving                 Orientation Level: Disoriented to;Place;Time;Situation                     General Comments      Exercises     Assessment/Plan    PT Assessment Patient needs continued PT services  PT Problem List Decreased strength;Decreased activity tolerance;Decreased balance;Decreased mobility;Pain       PT Treatment Interventions Functional mobility training;Therapeutic activities;Therapeutic exercise    PT Goals (Current goals can be found in the Care Plan section)  Acute Rehab PT Goals Patient Stated Goal: did not state PT Goal Formulation: Patient unable to participate in goal setting Time For Goal Achievement: 12/01/16 Potential to Achieve Goals: Poor    Frequency Min 3X/week   Barriers to discharge        Co-evaluation               End of Session Equipment Utilized During Treatment: Gait belt Activity Tolerance: Patient limited by pain Patient left: in chair;with call bell/phone within reach;with chair alarm set Nurse Communication: Mobility status;Need for lift equipment Chales Abrahams, RN aware of pt's location, and need to use maxi move for transfer BTB.  ) PT Visit Diagnosis: Muscle weakness (generalized) (M62.81);Other abnormalities of gait and mobility (R26.89)    Time: 1610-9604 PT Time Calculation (min) (ACUTE ONLY): 33 min   Charges:   PT Evaluation $PT Eval Moderate Complexity: 1 Procedure PT Treatments $Therapeutic Activity: 8-22 mins   PT G Codes:   PT G-Codes **NOT FOR INPATIENT CLASS** Functional Assessment Tool Used: AM-PAC 6 Clicks Basic Mobility;Clinical judgement Functional Limitation: Mobility: Walking and moving around Mobility: Walking and Moving Around Current Status (V4098): At least 60 percent but less than 80 percent impaired, limited or restricted Mobility: Walking and Moving Around Goal Status [phone]): At least 40 percent but less than 60 percent impaired, limited or restricted    Beth Amaris Garrette, PT, DPT X: [phone]

## 2016-11-17 NOTE — Progress Notes (Signed)
Pharmacy Antibiotic Note  [name] is a 83 y.o. female admitted on 11/13/2016 with pneumonia.  Pharmacy has been consulted for Vancomycin dosing.  Plan: Vancomycin 1000mg  loading dose, then 500mg   IV every 24 hours.  Goal trough 15-20 mcg/mL.  Also on Levaquin 500mg  IV q48hr F/U cxs and clinical progress  Height: 5\' 2"  (157.5 cm) Weight: 102 lb 4.8 oz (46.4 kg) IBW/kg (Calculated) : 50.1  Temp (24hrs), Avg:99.5 F (37.5 C), Min:98.5 F (36.9 C), Max:100.3 F (37.9 C)   Recent Labs Lab 11/13/16 0705 11/14/16 0431 11/15/16 0606 11/16/16 0612  WBC 14.0* 11.0* 13.6* 15.1*  CREATININE 1.72* 1.20* 1.07* 1.19*    Estimated Creatinine Clearance: 20.7 mL/min (A) (by C-G formula based on SCr of 1.19 mg/dL (H)).    Allergies  Allergen Reactions  . Amoxicillin-Pot Clavulanate     REACTION: UNKNOWN REACTION  . Cephalexin     REACTION: UNKNOWN REACTION  . Nsaids     UNKNOWN REACTION    Antimicrobials this admission: Vancomycin 3/28 >> Levaquin 3/28 >>  Dose adjustments this admission: N/A  Microbiology results: 11/13/16 MRSA PCR: negative  Thank you for allowing pharmacy to be a part of this patient's care.  Elder CyphersLorie Shelise Maron, BS Pharm D, [place] Clinical Pharmacist Pager [phone] 11/17/2016 5:15 PM

## 2016-11-18 ENCOUNTER — Encounter (HOSPITAL_COMMUNITY): Payer: Self-pay | Admitting: Primary Care

## 2016-11-18 DIAGNOSIS — F0390 Unspecified dementia without behavioral disturbance: Secondary | ICD-10-CM

## 2016-11-18 DIAGNOSIS — S42202D Unspecified fracture of upper end of left humerus, subsequent encounter for fracture with routine healing: Secondary | ICD-10-CM

## 2016-11-18 DIAGNOSIS — Z7189 Other specified counseling: Secondary | ICD-10-CM

## 2016-11-18 DIAGNOSIS — Z515 Encounter for palliative care: Secondary | ICD-10-CM

## 2016-11-18 MED ORDER — VANCOMYCIN HCL 500 MG IV SOLR
500.0000 mg | INTRAVENOUS | Status: DC
  Administered 2016-11-18 – 2016-11-20 (×2): 500 mg via INTRAVENOUS
  Filled 2016-11-18 (×2): qty 500

## 2016-11-18 NOTE — Progress Notes (Signed)
LCSW following with disposition and plan:  New SNF: [facility].  Discussed with son at bedside regarding plans for SNF. Remains agreeable.  Son wants to speak to doctor regarding her PNA and medical clearance.  Patient is able to transfer to SNF at discharge and over the weekend if medically cleared.  Deretha Emory, MSW Clinical Social Work: Optician, dispensing Coverage for :  [phone]

## 2016-11-18 NOTE — Plan of Care (Signed)
Problem: Acute Rehab OT Goals (only OT should resolve) Goal: Pt. Will Perform Toileting-Clothing Manipulation Outcome: Not Applicable Date Met: 87/19/94 Patient unable to use LUE due to recent humerus fx. Unable to use RUE due radius fx and pointer finger fx. Goal will be resolved as it is not appropriate at this time.

## 2016-11-18 NOTE — Progress Notes (Signed)
qPhysical Therapy Treatment Patient Details Name: [name] MRN: [id number] DOB: October 05, 1919 Today's Date: 11/18/2016    History of Present Illness 83 y/o female resident of ALF, was brought to ED after a fall. Pt with bilateral UE fractures.  Pt with fx L humerus with L sling and R radial andring finger fx.  Pt NWB BUE.    PT Comments    Pt received in bed, and was marginally agreeable to PT tx.  Pt required increased assistance for static sitting on the EOB today, and demonstrates increase in posterior lean and pushing posteriorly.  She seems to be more confused today, with some garbled speech, but other times her speech is very clear.  After RN had attempted to administer pain medication crushed in applesauce, she was noted to have some pocketing of the apple sauce, which then needed to be cleared from pt's mouth.  Her mobility is quite limited by pain and she seems to resist therapists at times.  She required Total A +2 person for a squat pivot transfer from the bed<>chair.  She was left sitting up in the chair with a maxi move sling and chair alarm under her.  Updated PT POC for continued trial to determine if she is able to continue to participate with skilled PT.  Continue to recommend SNF vs long term placement.    Follow Up Recommendations  SNF     Equipment Recommendations  None recommended by PT    Recommendations for Other Services       Precautions / Restrictions Precautions Precautions: Fall Precaution Comments: Sling LUE Required Braces or Orthoses: Sling;Other Brace/Splint Other Brace/Splint: Soft splint on right forearm Restrictions Weight Bearing Restrictions: Yes RUE Weight Bearing: Non weight bearing LUE Weight Bearing: Non weight bearing    Mobility  Bed Mobility Overal bed mobility: Needs Assistance Bed Mobility: Supine to Sit     Supine to sit: Total assist;HOB elevated        Transfers Overall transfer level: Needs assistance   Transfers:  Squat Pivot Transfers;Sit to/from Stand     Squat pivot transfers: Total assist;Max assist     General transfer comment: Pt was transferred bed<>chair utilizing a squat pivot transfer with gait belt, and maxi move sling positioned in the chair.    Ambulation/Gait                 Stairs            Wheelchair Mobility    Modified Rankin (Stroke Patients Only)       Balance Overall balance assessment: History of Falls;Needs assistance Sitting-balance support: No upper extremity supported;Feet supported Sitting balance-Leahy Scale: Fair Sitting balance - Comments: Demonstrates posterior lean, and at times she pushes posteriorly.  she required constant assistance to maintain static sitting balance today with varying levels of assistance.  Increased time sitting on the EOB with RN attempting to get pt to take pain meds that were crushed in applesauce, however pt only took 1 bite, and despite swallowing, she was noted to pocket the applsause, which PT then wiped out of her mouth.     Standing balance support: No upper extremity supported Standing balance-Leahy Scale: Zero                              Cognition Arousal/Alertness: Awake/alert Behavior During Therapy: Agitated;Anxious  Orientation Level: Disoriented to;Place;Time;Situation                    Exercises Shoulder Exercises Shoulder Flexion: PROM;Right;10 reps;Supine Shoulder ABduction: PROM;Right;10 reps;Supine Shoulder External Rotation: PROM;Right;10 reps;Supine Elbow Flexion: PROM;Right;10 reps;Supine Elbow Extension: PROM;Right;10 reps;Supine Correct positioning of sling/immobilizer: Maximal assistance Positioning of UE while sleeping: Maximal assistance    General Comments        Pertinent Vitals/Pain Pain Assessment: Faces Faces Pain Scale: Hurts worst Pain Location: With mobility.  Pain Descriptors / Indicators: Sharp Pain Intervention(s):  Limited activity within patient's tolerance;Monitored during session;Repositioned;Other (comment) (Attempted to have pt medicated for pain, however pt would not swallow medication, and was noted to be pocketing the applesause the RN was giving it to her with. )    Home Living                      Prior Function            PT Goals (current goals can now be found in the care plan section) Acute Rehab PT Goals Patient Stated Goal: None stated PT Goal Formulation: Patient unable to participate in goal setting Time For Goal Achievement: 12/01/16 Potential to Achieve Goals: Poor Progress towards PT goals: Not progressing toward goals - comment    Frequency    Min 3X/week      PT Plan Other (comment) (Pt not able to follow commands today, therefore, may continue on a trial of PT to determine if she is remains appropriate for further therapeutic interventions. )    Co-evaluation PT/OT/SLP Co-Evaluation/Treatment: Yes Reason for Co-Treatment: Complexity of the patient's impairments (multi-system involvement);Necessary to address cognition/behavior during functional activity;For patient/therapist safety;To address functional/ADL transfers PT goals addressed during session: Mobility/safety with mobility;Balance OT goals addressed during session: Strengthening/ROM     End of Session Equipment Utilized During Treatment: Gait belt Activity Tolerance: Patient limited by pain;Other (comment) (limited by cognition) Patient left: in chair;with call bell/phone within reach;with chair alarm set Nurse Communication: Need for lift equipment;Mobility status Leotis Shames, RN aware of pt's mobilty status, and location.  Use Maxi move for transfer back to bed. ) PT Visit Diagnosis: Muscle weakness (generalized) (M62.81);Other abnormalities of gait and mobility (R26.89)     Time: 1610-9604 PT Time Calculation (min) (ACUTE ONLY): 31 min  Charges:  $Therapeutic Activity: 8-22 mins                     G Codes:       Beth Pachia Strum, PT, DPT X: [phone]

## 2016-11-18 NOTE — Evaluation (Signed)
Clinical/Bedside Swallow Evaluation Patient Details  Name: [name] MRN: [id number] Date of Birth: April 28, 1920  Today's Date: 11/18/2016 Time: SLP Start Time (ACUTE ONLY): 1501 SLP Stop Time (ACUTE ONLY): 1527 SLP Time Calculation (min) (ACUTE ONLY): 26 min  Past Medical History:  Past Medical History:  Diagnosis Date  . Anemia   . CKD (chronic kidney disease) stage 3, GFR 30-59 ml/min 06/07/2015  . Dementia   . Escherichia coli urinary tract infection 02/16/16   Cipro  . Gastric ulcer    gi bleed secondary  . HCAP (healthcare-associated pneumonia) 06/06/2015  . History of fracture of right hip 05/21/2012   ORIF 05/13/12   . History of subdural hematoma (post traumatic) 05/21/2012   Dx on MR I in September 2013   . Hypertension   . Hypothyroidism   . Macular degeneration   . Vertigo    Past Surgical History:  Past Surgical History:  Procedure Laterality Date  . ABDOMINAL HYSTERECTOMY    . CARPAL TUNNEL RELEASE    . FEMUR IM NAIL  05/13/2012   Procedure: INTRAMEDULLARY (IM) NAIL FEMORAL;  Surgeon: Senaida Lange, MD;  Location: [facility] OR;  Service: Orthopedics;  Laterality: Right;  . FRACTURE SURGERY    . MASTECTOMY    . THYROID SURGERY     HPI:  83 y/o female resident of ALF, was brought to ED after a fall. Pt with bilateral UE fractures. Pt with fx L humerus with L sling and R radial andring finger fx. Pt NWB BUE. PT note earlier today "After RN had attempted to administer pain medication crushed in applesauce, she was noted to have some pocketing of the apple sauce, which then needed to be cleared from pt's mouth." ST to evaluate to administer clinical swallow evaluation.   Assessment / Plan / Recommendation Clinical Impression  BSE completed with Pt seated upright in the chair presenting with mild/moderate cognitive based dysphagia. Pt was agitated and confused believing she was currently playing Bingo during evaluation. Pt's daughter in law was present for evaluation. Pt  was not agreeable to oral care despite cognitive compensatory strategies of distraction and reassurance; oral cavity was noted to be dry with dried secretions. Pt did however agree to pausing the game for a "snack" after asking the participants if that was ok. She consumed ice chips and thin liquids with poor awareness of bolus and suspected delayed swallow trigger, SLP noted mild wet vocal quality and delayed throat clears. NTLs were consumed without incident. With jell-o, pt demonstrated bilateral pocketing, oral holding and suspected delayed swallow trigger. Recommend D1/puree textures and Nectar thick liquids. Administer 1/2 tsp bites of puree followed by NTL cup sip and check oral cavity between bites/sips to ensure all residue has been swallowed. Only feed when agreeable to PO and when alert. Pt's daughter in law was extensively educated of aspiration precautions and recommendations. ST to follow while in acute setting. SLP Visit Diagnosis: Dysphagia, oropharyngeal phase (R13.12)    Aspiration Risk  Moderate aspiration risk    Diet Recommendation Dysphagia 1 (Puree);Nectar-thick liquid   Liquid Administration via: Cup Medication Administration: Crushed with puree Supervision: Full supervision/cueing for compensatory strategies Compensations: Minimize environmental distractions;Slow rate;Small sips/bites;Multiple dry swallows after each bite/sip;Follow solids with liquid;Clear throat intermittently;Hard cough after swallow;Effortful swallow Postural Changes: Seated upright at 90 degrees    Other  Recommendations Oral Care Recommendations: Oral care QID Other Recommendations: Order thickener from pharmacy   Follow up Recommendations Skilled Nursing facility;24 hour supervision/assistance  Frequency and Duration min 2x/week  2 weeks       Prognosis Prognosis for Safe Diet Advancement: Fair Barriers to Reach Goals: Cognitive deficits      Swallow Study   General Date of Onset:  11/13/16 HPI: 83 y/o female resident of ALF, was brought to ED after a fall. Pt with bilateral UE fractures. Pt with fx L humerus with L sling and R radial andring finger fx. Pt NWB BUE. PT note earlier today "After RN had attempted to administer pain medication crushed in applesauce, she was noted to have some pocketing of the apple sauce, which then needed to be cleared from pt's mouth." ST to evaluate to administer clinical swallow evaluation. Type of Study: Bedside Swallow Evaluation Previous Swallow Assessment: none Diet Prior to this Study: Dysphagia 3 (soft);Thin liquids Temperature Spikes Noted: No Respiratory Status: Room air History of Recent Intubation: No Behavior/Cognition: Confused;Agitated;Distractible;Requires cueing;Doesn't follow directions Oral Care Completed by SLP:  (attempted, Pt refused) Oral Cavity - Dentition: Adequate natural dentition Vision: Functional for self-feeding Self-Feeding Abilities: Total assist Patient Positioning: Upright in bed Baseline Vocal Quality: Normal Volitional Cough: Cognitively unable to elicit    Oral/Motor/Sensory Function     Ice Chips Ice chips: Impaired Presentation: Spoon Oral Phase Impairments: Reduced lingual movement/coordination;Poor awareness of bolus Oral Phase Functional Implications: Prolonged oral transit Pharyngeal Phase Impairments: Multiple swallows;Wet Vocal Quality   Thin Liquid Thin Liquid: Impaired Presentation: Cup;Spoon;Straw Oral Phase Impairments: Reduced labial seal;Reduced lingual movement/coordination;Poor awareness of bolus Oral Phase Functional Implications: Prolonged oral transit Pharyngeal  Phase Impairments: Multiple swallows;Wet Vocal Quality;Throat Clearing - Delayed;Cough - Delayed    Nectar Thick Nectar Thick Liquid: Within functional limits   Honey Thick Honey Thick Liquid: Not tested   Puree Puree: Impaired Presentation: Spoon Oral Phase Impairments: Reduced lingual movement/coordination;Poor  awareness of bolus Oral Phase Functional Implications: Right lateral sulci pocketing;Left lateral sulci pocketing;Prolonged oral transit;Oral holding Pharyngeal Phase Impairments: Suspected delayed Swallow;Multiple swallows;Wet Vocal Quality   Solid   Amelia H. Romie Levee, CCC-SLP Speech Language Pathologist    Solid: Not tested        Georgetta Haber 11/18/2016,3:41 PM

## 2016-11-18 NOTE — Progress Notes (Signed)
Patient identified on Low braden report  Patient being bathed on RD arrival and family member in meeting with Palliative care.   Diet to be changed to D1, NTL per ST. Appropriate Supplements ordered for patient. Palliative now following.   RD to monitor.   Christophe Louis RD, LDN, CNSC Clinical Nutrition Pager: [phone] 11/18/2016 3:40 PM

## 2016-11-18 NOTE — Consult Note (Signed)
Consultation Note Date: 11/18/2016   Patient Name: [name]  DOB: 26-Jun-1920  MRN: [id number]  Age / Sex: 83 y.o., female  PCP: Assunta Found, MD Referring Physician: [name], MD  Reason for Consultation: Establishing goals of care and Psychosocial/spiritual support  HPI/Patient Profile: 83 y.o. female  with past medical history of kidney disease stage III, mild to moderate dementia, E. coli UTI, healthcare associated pneumonia, right hip fracture in 2013, hypertension, macular degeneration, history of G.I. bleed, [facility] since 2010 admitted on 11/13/2016 with fall with multiple fractures.   Clinical Assessment and Goals of Care: Mrs. [name] is sitting quietly in her Dorene Sorrow chair. She looks toward me as I turned speaks to me. She's pleasantly confused. Present today at bedside is daughter-in-law all, Sruthi Maurer. Erskine Squibb shares that she worries about the new diagnosis of pneumonia. She shares that family's goal is for Mrs. Jake Shark to go to [facility] as private pay. Erskine Squibb shares that she had gone through this type of situation with her aunt, and her mother who she decided to "do not rehospitalize".  Erskine Squibb shares her worry that Mrs. Heinkel would not be able to return to any quality of life after this fall, stating that she has fallen in the past, but never this bad. We plan for family meeting Monday at 2:30 in the afternoon.  Son Jean Rosenthal and his wife Alexia Freestone will be in from [place].   Healthcare power of attorney HCPOA - Erskine Squibb states that she believes her husband, Alexander Mcauley, has legal healthcare power of attorney    SUMMARY OF RECOMMENDATIONS   continue to treat the treatable but no extraordinary measures, no CPR or intubation, no tube feeding. Family is considering disposition to [facility] as private pay, also considering the benefits of hospice at [facility].    Code Status/Advance Care  Planning:  DNR  Symptom Management:   per hospitalist no additional needs at this time  Palliative Prophylaxis:   Aspiration, Frequent Pain Assessment and Turn Reposition  Additional Recommendations (Limitations, Scope, Preferences):  Continuous current treatment plan over the weekend, considering placement at [facility] private pay  Psycho-social/Spiritual:   Desire for further Chaplaincy support:no  Additional Recommendations: Family meeting for Monday afternoon at 2:30, for further decision-making  Prognosis:   < 6 months, or less would not be surprising based on frailty, recurrent falls, pneumonia, restricted diet.  Discharge Planning: Family is requesting [facility] private pay      Primary Diagnoses: Present on Admission: . AKI (acute kidney injury) (HCC) . Fall . Right orbital fracture (HCC) . Right maxillary fracture (HCC) . Closed fracture of left proximal humerus . Right radial fracture . CKD (chronic kidney disease) stage 3, GFR 30-59 ml/min . Essential hypertension . Dementia . Hypothyroidism . Anemia . Acute respiratory failure with hypoxia (HCC) . Fever   I have reviewed the medical record, interviewed the patient and family, and examined the patient. The following aspects are pertinent.  Past Medical History:  Diagnosis Date  . Anemia   . CKD (  chronic kidney disease) stage 3, GFR 30-59 ml/min 06/07/2015  . Dementia   . Escherichia coli urinary tract infection 02/16/16   Cipro  . Gastric ulcer    gi bleed secondary  . HCAP (healthcare-associated pneumonia) 06/06/2015  . History of fracture of right hip 05/21/2012   ORIF 05/13/12   . History of subdural hematoma (post traumatic) 05/21/2012   Dx on MR I in September 2013   . Hypertension   . Hypothyroidism   . Macular degeneration   . Vertigo    Social History   Social History  . Marital status: Widowed    Spouse name: N/A  . Number of children: N/A  . Years of education: N/A   Social History Main  Topics  . Smoking status: Never Smoker  . Smokeless tobacco: Never Used  . Alcohol use No  . Drug use: No  . Sexual activity: No   Other Topics Concern  . None   Social History Narrative  . None   Family History  Problem Relation Age of Onset  . Thyroid disease Neg Hx   . Cancer Neg Hx   . Diabetes Neg Hx   . Heart disease Neg Hx   . Stroke Neg Hx    Scheduled Meds: . donepezil  10 mg Oral Daily  . escitalopram  10 mg Oral Daily  . heparin  5,000 Units Subcutaneous Q8H  . levofloxacin (LEVAQUIN) IV  500 mg Intravenous Q48H  . levothyroxine  125 mcg Oral QAC breakfast  . loperamide  2 mg Oral BID  . loratadine  10 mg Oral Daily  . losartan  50 mg Oral Daily  . mouth rinse  15 mL Mouth Rinse BID  . memantine  10 mg Oral BID  . vancomycin  500 mg Intravenous Q48H   Continuous Infusions: PRN Meds:.acetaminophen **OR** acetaminophen, HYDROcodone-acetaminophen, magnesium hydroxide, ondansetron **OR** ondansetron (ZOFRAN) IV Medications Prior to Admission:  Prior to Admission medications   Medication Sig Start Date End Date Taking? Authorizing Provider  acetaminophen (TYLENOL) 500 MG tablet Take 500 mg by mouth at bedtime. For pain management   Yes Historical Provider, MD  donepezil (ARICEPT) 10 MG tablet Take 10 mg by mouth daily.    Yes Historical Provider, MD  escitalopram (LEXAPRO) 10 MG tablet Take 10 mg by mouth daily.   Yes Historical Provider, MD  levothyroxine (SYNTHROID, LEVOTHROID) 125 MCG tablet Take 125 mcg by mouth daily before breakfast.   Yes Historical Provider, MD  loperamide (IMODIUM A-D) 2 MG tablet Take 2 mg by mouth 2 (two) times daily.   Yes Historical Provider, MD  loratadine (CLARITIN) 10 MG tablet Take 10 mg by mouth daily.   Yes Historical Provider, MD  losartan (COZAAR) 50 MG tablet Take 50 mg by mouth daily.   Yes Historical Provider, MD  Lutein 6 MG CAPS Take 1 capsule by mouth daily.   Yes Historical Provider, MD  memantine (NAMENDA) 10 MG tablet  Take 10 mg by mouth 2 (two) times daily.   Yes Historical Provider, MD  miconazole (BAZA ANTIFUNGAL) 2 % cream Apply 1 application topically 2 (two) times daily. To buttocks for redness   Yes Historical Provider, MD  Multiple Vitamin (DAILY VITE) TABS Take 1 tablet by mouth daily.   Yes Historical Provider, MD  Polyethyl Glycol-Propyl Glycol (SYSTANE OP) Apply 1 drop to eye 4 (four) times daily.   Yes Historical Provider, MD   Allergies  Allergen Reactions  . Amoxicillin-Pot Clavulanate  REACTION: UNKNOWN REACTION  . Cephalexin     REACTION: UNKNOWN REACTION  . Nsaids     UNKNOWN REACTION   Review of Systems  Unable to perform ROS: Acuity of condition    Physical Exam  Constitutional: No distress.  Appears frail, makes but does not keep eye contact  HENT:  Head: Normocephalic.  Bruising to right-sided face  Cardiovascular: Normal rate and regular rhythm.   Pulmonary/Chest: Effort normal. No respiratory distress.  Abdominal: Soft. She exhibits no distension.  Musculoskeletal:  Multiple fractures right forearm left upper arm  Neurological: She is alert.  Oriented to self  Skin: Skin is warm and dry.  Nursing note and vitals reviewed.   Vital Signs: BP 139/75 (BP Location: Right Arm)   Pulse 72   Temp 98.4 F (36.9 C) (Oral)   Resp 20   Ht  (1.575 m)   Wt 46.4 kg (102 lb 4.8 oz)   SpO2 95%   BMI 18.71 kg/m  Pain Assessment: PAINAD   Pain Score: 2    SpO2: SpO2: 95 % O2 Device:SpO2: 95 % O2 Flow Rate: .O2 Flow Rate (L/min): 3 L/min  IO: Intake/output summary:  Intake/Output Summary (Last 24 hours) at 11/18/16 1549 Last data filed at 11/18/16 0836  Gross per 24 hour  Intake              540 ml  Output               50 ml  Net              490 ml    LBM: Last BM Date:  (unsure) Baseline Weight: Weight: 46.3 kg (102 lb) Most recent weight: Weight: 46.4 kg (102 lb 4.8 oz)     Palliative Assessment/Data:   Flowsheet Rows     Most Recent Value    Intake Tab  Referral Department  Hospitalist  Unit at Time of Referral  Med/Surg Unit  Palliative Care Primary Diagnosis  Trauma  Date Notified  11/18/16  Palliative Care Type  New Palliative care  Reason for referral  Clarify Goals of Care  Date of Admission  11/13/16  Date first seen by Palliative Care  11/18/16  # of days Palliative referral response time  0 Day(s)  # of days IP prior to Palliative referral  5  Clinical Assessment  Palliative Performance Scale Score  30%  Pain Max last 24 hours  Not able to report  Pain Min Last 24 hours  Not able to report  Dyspnea Max Last 24 Hours  Not able to report  Dyspnea Min Last 24 hours  Not able to report  Psychosocial & Spiritual Assessment  Palliative Care Outcomes  Patient/Family meeting held?  Yes  Who was at the meeting?  DIL at bedside  Palliative Care Outcomes  Provided psychosocial or spiritual support, Clarified goals of care  Patient/Family wishes: Interventions discontinued/not started   Mechanical Ventilation      Time In: 1505 Time Out: 1540 Time Total: 35 minutes Greater than 50%  of this time was spent counseling and coordinating care related to the above assessment and plan.  Signed by: Katheran Awe, NP   Please contact Palliative Medicine Team phone at [phone] for questions and concerns.  For individual provider: See Loretha Stapler

## 2016-11-18 NOTE — Progress Notes (Signed)
Occupational Therapy Treatment Patient Details Name: [name] MRN: [id number] DOB: 05-Feb-1920 Today's Date: 11/18/2016    History of present illness 83 y/o female resident of ALF, was brought to ED after a fall. Pt with bilateral UE fractures.  Pt with fx L humerus with L sling and R radial andring finger fx.  Pt NWB BUE.   OT comments  Co-tx with PT with OT focusing on RUE range of motion and proper positioning of LUE in sling and RUE while in recliner. Patient with increased pain and anxiety during any activity. Patient confused during session and not making appropriate responses to commands all the time. Did communicate with Son after session regarding proper positioning of BUEs when in bed and in chair. Long term goals have been changed for appropriateness.            Precautions / Restrictions Precautions Precautions: Fall Precaution Comments: Sling LUE Required Braces or Orthoses: Sling;Other Brace/Splint Other Brace/Splint: Soft splint on right forearm Restrictions Weight Bearing Restrictions: Yes RUE Weight Bearing: Non weight bearing LUE Weight Bearing: Non weight bearing       Mobility Bed Mobility Overal bed mobility: Needs Assistance Bed Mobility: Supine to Sit     Supine to sit: Total assist;HOB elevated        Transfers                          ADL either performed or assessed with clinical judgement        Vision Patient Visual Report: No change from baseline     Perception     Praxis      Cognition Arousal/Alertness: Awake/alert Behavior During Therapy: Agitated;Anxious                                            Exercises Shoulder Exercises Shoulder Flexion: PROM;Right;10 reps;Supine Shoulder ABduction: PROM;Right;10 reps;Supine Shoulder External Rotation: PROM;Right;10 reps;Supine Elbow Flexion: PROM;Right;10 reps;Supine Elbow Extension: PROM;Right;10 reps;Supine Correct positioning of sling/immobilizer:  Maximal assistance Positioning of UE while sleeping: Maximal assistance   Shoulder Instructions Shoulder Instructions Correct positioning of sling/immobilizer: Maximal assistance Positioning of UE while sleeping: Maximal assistance      Pertinent Vitals/ Pain       Pain Assessment: Faces Faces Pain Scale: Hurts worst Pain Location: With mobility.  Pain Intervention(s): Monitored during session;Limited activity within patient's tolerance;RN gave pain meds during session;Utilized relaxation techniques      Progress Toward Goals  OT Goals(current goals can now be found in the care plan section)  Progress towards OT goals: Goals drowngraded-see care plan;Progressing toward goals  Acute Rehab OT Goals Patient Stated Goal: None stated  Plan Discharge plan remains appropriate;Frequency remains appropriate    Co-evaluation    PT/OT/SLP Co-Evaluation/Treatment: Yes Reason for Co-Treatment: Complexity of the patient's impairments (multi-system involvement);For patient/therapist safety;To address functional/ADL transfers   OT goals addressed during session: Strengthening/ROM      End of Session Equipment Utilized During Treatment: Gait belt  OT Visit Diagnosis: Muscle weakness (generalized) (M62.81);Feeding difficulties (R63.3)   Activity Tolerance Patient limited by pain   Patient Left in chair;with call bell/phone within reach;with chair alarm set           Time: 0940-1011 OT Time Calculation (min): 31 min  Charges: OT General Charges $OT Visit: 1 Procedure OT Treatments $Self Care/Home Management : 23-37 mins (  [address]  Limmie Patricia, OTR/L,CBIS  [phone]    Kymere Fullington, Charisse March 11/18/2016, 11:13 AM

## 2016-11-18 NOTE — Care Management Important Message (Signed)
Important Message  Patient Details  Name: [name] MRN: [id number] Date of Birth: 05-22-1920   Medicare Important Message Given:  Yes    Malcolm Metro, RN 11/18/2016, 9:43 AM

## 2016-11-18 NOTE — Progress Notes (Signed)
PROGRESS NOTE    [name]  NWG:[id number] DOB: 12-03-1919 DOA: 11/13/2016 PCP: [name], MD    Brief Narrative:  83 y/o female resident of ALF, was brought to ED after a fall. She sustained several injuries as described below. Orthopedics consulted for  bilateral upper extremity fractures, recommended non surgical treatment and outpatient follow up in 2 weeks. Marland Kitchen She was also dehydrated with AKI, which has improved with IV fluids.Will likely need SNF on discharge when she is ready.. Meanwhile pt developed low grade temp and worsening leukocytosis and CXR showed possible right sided pneumonia. She is being treated for hcap.    Assessment & Plan:   Active Problems:   Essential hypertension   Hypothyroidism   Anemia   Fever   Dementia   Acute respiratory failure with hypoxia (HCC)   CKD (chronic kidney disease) stage 3, GFR 30-59 ml/min   AKI (acute kidney injury) (HCC)   Fall   Right orbital fracture (HCC)   Right maxillary fracture (HCC)   Closed fracture of left proximal humerus   Right radial fracture   Laceration of right ring finger without foreign body without damage to nail   Closed nondisplaced fracture of proximal phalanx of right ring finger    Right orbital fracture/right maxillary fracture. Case was discussed by Dr.Mesner with Dr. Annalee Genta on call for ENT. It was felt that patient's injuries were nonoperative and the patient should follow-up with ENT in the office in 10 days. Soft diet was recommended. It was advised the patient not blow her nose.pain well controlled.   Right radial fracture/left humerus fracture. Currently in sling/splint. Orthopedics has been consulted and recommended non operative management. Pain control. And recommended follow up with ortho in 10 days as outpatient. PT eval recommended SNF placement.   Chronic kidney disease stage III with acute kidney injury. Suspect is related to volume depletion. Improved with IV fluids.   Acute  respiratory failure with hypoxia. Patient was admitted to the hospital on room air.  On the night of 3/ 26 pt had some respiratory distress, and was put on 4 lit of  oxygen, currently off oxygen. , . But she has low grade fevers, and wbc count is getting worse. Repeat CXR and UA ordered. CXR shows right sided asymmetric opacity, will treat her for health care associated pneumonia.   Hypothyroidism. Continue Synthroid  Hypertension. Stable. Continue outpatient regimen  Fall. The patient has sustained several injuries. Case was discussed with Dr. Dwain Sarna on call for trauma surgery who agreed with keeping the patient at [facility] . Physical therapy has been consulted. She has been receiving IV morphine for pain control, but due to excess sedation, will change to po meds. She will likely need a higher level of care on discharge.   Anemia. Patient has had a significant decline in hemoglobin. She has had some bleeding from facial injuries. There is also likely an element of hemodilution, since she presented with dehydration. Anemia panel shows elevated ferritin and hemoglobin stabilized.. Continue to monitor.   Dementia. No agitation. Stable.   Low grade fevers and worsening wbc count: CXR shows asymmetric right air space disease. Will get SLP evaluation. Start her on IV antibiotics for hcap.   Deconditioning: in view of the above morbidities, discussed with the patient's family and requested palliative care consult for goals of care and symptom management.      DVT prophylaxis: heparin Code Status: DNR, if she fails to improve, family would want to  consider comfort measures Family Communication: discussed with son at the bedside Disposition Plan: will likely need SNF   Consultants:   Orthopedics  Palliative care.   Procedures:   None   Antimicrobials:   None.    Subjective: Confused but comfortable on RA.   Objective: Vitals:   11/17/16 0501 11/17/16 1435  11/17/16 2100 11/18/16 0435  BP: 139/62 (!) 102/58 100/60 139/75  Pulse: 70 80 85 72  Resp: Temp: 100.3 F (37.9 C) 98.5 F (36.9 C) 100 F (37.8 C) 98.4 F (36.9 C)  TempSrc: Axillary Axillary Axillary Oral  SpO2: 96% 93% 95% 95%  Weight:      Height:        Intake/Output Summary (Last 24 hours) at 11/18/16 1451 Last data filed at 11/18/16 0836  Gross per 24 hour  Intake              540 ml  Output               50 ml  Net              490 ml   Filed Weights   11/13/16 0646 11/13/16 1227  Weight: 46.3 kg (102 lb) 46.4 kg (102 lb 4.8 oz)    Examination:  General exam: Appears calm and comfortable ON RA. Ecchymosis over the right side of the face.  Respiratory system: bilateral rhonchi.  Diminished at bases. Respiratory effort normal. Cardiovascular system: S1 & S2 heard, RRR. No JVD, murmurs, rubs, gallops or clicks. No pedal edema. Gastrointestinal system: Abdomen is nondistended, soft and nontender. No organomegaly or masses felt. Normal bowel sounds heard. Central nervous system: confused, No focal neurological deficits. Extremities: bilateral upper extremities in sling/ splint. Skin: ecchymosis over right face with swelling Psychiatry: confused    Data Reviewed: I have personally reviewed following labs and imaging studies  CBC:  Recent Labs Lab 11/13/16 0705 11/14/16 0431 11/15/16 0606 11/16/16 0612  WBC 14.0* 11.0* 13.6* 15.1*  NEUTROABS 9.0*  --   --   --   HGB 11.9* 8.8* 8.6* 8.7*  HCT 35.3* 25.8* 25.8* 25.8*  MCV 96.7 95.9 97.0 97.0  PLT 230 188 190 180   Basic Metabolic Panel:  Recent Labs Lab 11/13/16 0705 11/14/16 0431 11/15/16 0606 11/16/16 0612  NA 134* 137 138 138  K 4.5 4.1 4.1 3.9  CL 102 107 107 106  CO2 GLUCOSE 110* 138* 115* 110*  BUN 30* 29* 26* 32*  CREATININE 1.72* 1.20* 1.07* 1.19*  CALCIUM 9.2 8.3* 8.4* 8.5*   GFR: Estimated Creatinine Clearance: 20.7 mL/min (A) (by C-G formula based on SCr  of 1.19 mg/dL (H)). Liver Function Tests: No results for input(s): AST, ALT, ALKPHOS, BILITOT, PROT, ALBUMIN in the last 168 hours. No results for input(s): LIPASE, AMYLASE in the last 168 hours. No results for input(s): AMMONIA in the last 168 hours. Coagulation Profile: No results for input(s): INR, PROTIME in the last 168 hours. Cardiac Enzymes: No results for input(s): CKTOTAL, CKMB, CKMBINDEX, TROPONINI in the last 168 hours. BNP (last 3 results) No results for input(s): PROBNP in the last 8760 hours. HbA1C: No results for input(s): HGBA1C in the last 72 hours. CBG: No results for input(s): GLUCAP in the last 168 hours. Lipid Profile: No results for input(s): CHOL, HDL, LDLCALC, TRIG, CHOLHDL, LDLDIRECT in the last 72 hours. Thyroid Function Tests: No results for input(s): TSH, T4TOTAL, FREET4, T3FREE, THYROIDAB in the last  72 hours. Anemia Panel: No results for input(s): VITAMINB12, FOLATE, FERRITIN, TIBC, IRON, RETICCTPCT in the last 72 hours. Sepsis Labs: No results for input(s): PROCALCITON, LATICACIDVEN in the last 168 hours.  Recent Results (from the past 240 hour(s))  MRSA PCR Screening     Status: None   Collection Time: 11/13/16 11:58 AM  Result Value Ref Range Status   MRSA by PCR NEGATIVE NEGATIVE Final    Comment:        The GeneXpert MRSA Assay (FDA approved for NASAL specimens only), is one component of a comprehensive MRSA colonization surveillance program. It is not intended to diagnose MRSA infection nor to guide or monitor treatment for MRSA infections.          Radiology Studies: Dg Chest Port 1 View  Result Date: 11/17/2016 CLINICAL DATA:  Fever.  Hypertension. EXAM: PORTABLE CHEST 1 VIEW COMPARISON:  One-view chest x-ray 11/15/2016 FINDINGS: Heart size is normal. Aeration at the left base is improved. Asymmetric right-sided density remains. Underlying chronic interstitial coarsening is present. Degenerative changes are again seen in the  shoulders. IMPRESSION: 1. Improving aeration at the left base. 2. Persistent asymmetric right-sided airspace disease. Infection is not excluded. Electronically Signed   By: Marin Roberts M.D.   On: 11/17/2016 16:03        Scheduled Meds: . donepezil  10 mg Oral Daily  . escitalopram  10 mg Oral Daily  . heparin  5,000 Units Subcutaneous Q8H  . levofloxacin (LEVAQUIN) IV  500 mg Intravenous Q48H  . levothyroxine  125 mcg Oral QAC breakfast  . loperamide  2 mg Oral BID  . loratadine  10 mg Oral Daily  . losartan  50 mg Oral Daily  . mouth rinse  15 mL Mouth Rinse BID  . memantine  10 mg Oral BID  . vancomycin  500 mg Intravenous Q48H   Continuous Infusions:    LOS: 5 days    Time spent:    Jarrel Knoke, MD [facility] Pager [phone]  If 7PM-7AM, please contact night-coverage [email or web address] Password [email or web address] 11/18/2016, 2:51 PM

## 2016-11-19 NOTE — Progress Notes (Addendum)
PROGRESS NOTE    [name]  ZOX:[id number] DOB: [birth year] DOA: 11/13/2016 PCP: [name], MD    Brief Narrative:  83 y/o female resident of ALF, was brought to ED after a fall. She sustained several injuries as described below. Orthopedics consulted for  bilateral upper extremity fractures, recommended non surgical treatment and outpatient follow up in 2 weeks. Marland Kitchen She was also dehydrated with AKI, which has improved with IV fluids.Will likely need SNF on discharge when she is ready.. Meanwhile pt developed low grade temp and worsening leukocytosis and CXR showed possible right sided pneumonia. She is being treated for hcap.    Assessment & Plan:   Active Problems:   Essential hypertension   Hypothyroidism   Anemia   Fever   Dementia   Acute respiratory failure with hypoxia (HCC)   CKD (chronic kidney disease) stage 3, GFR 30-59 ml/min   AKI (acute kidney injury) (HCC)   Fall   Right orbital fracture (HCC)   Right maxillary fracture (HCC)   Closed fracture of left proximal humerus   Right radial fracture   Laceration of right ring finger without foreign body without damage to nail   Closed nondisplaced fracture of proximal phalanx of right ring finger   Palliative care encounter   Goals of care, counseling/discussion    Right orbital fracture/right maxillary fracture. Case was discussed by Dr.Mesner with Dr. Annalee Genta on call for ENT. It was felt that patient's injuries were nonoperative and the patient should follow-up with ENT in the office in 10 days. Soft diet was recommended. It was advised the patient not blow her nose.pain well controlled.   Right radial fracture/left humerus fracture. Currently in sling/splint. Orthopedics has been consulted and recommended non operative management. Pain control. And recommended follow up with ortho in 10 days as outpatient. PT eval recommended SNF placement.   Chronic kidney disease stage III with acute kidney injury.  Suspect is related to volume depletion. Improved with IV fluids.   Acute respiratory failure with hypoxia. Patient was admitted to the hospital on room air.  On the night of 3/ 26 pt had some respiratory distress, and was put on 4 lit of [place] oxygen, currently off oxygen. , . But she has low grade fevers, and wbc count is getting worse. Repeat CXR and UA ordered. CXR shows right sided asymmetric opacity, will treat her for health care associated pneumonia.   Hypothyroidism. Continue Synthroid  Hypertension. Stable. Continue outpatient regimen  Fall. The patient has sustained several injuries. Case was discussed with Dr. Dwain Sarna on call for trauma surgery who agreed with keeping the patient at [facility] . Physical therapy has been consulted. She has been receiving IV morphine for pain control, but due to excess sedation, will change to po meds. She will likely need a higher level of care on discharge.   Anemia. Patient has had a significant decline in hemoglobin. She has had some bleeding from facial injuries. There is also likely an element of hemodilution, since she presented with dehydration. Anemia panel shows elevated ferritin and hemoglobin stabilized..  Suspect anemia is probably secondary to a combination of chronic disease and some bleeding from facial injuries. Continue to monitor.   Dementia. No agitation. Stable.   Low grade fevers and worsening wbc count: CXR shows asymmetric right air space disease. slp evaluation recommended starting dysphagia 1 diet.  Started her on IV antibiotics for hcap. Patient's son does not want any blood draws at this time.  Deconditioning: in view of the above morbidities, discussed with the patient's family and requested palliative care consult for goals of care and symptom management.  Palliative care meeting scheduled for Monday.      DVT prophylaxis: heparin Code Status: DNR, if she fails to improve, family would want to consider comfort  measures Family Communication: discussed with son at the bedside Disposition Plan: will likely need SNF   Consultants:   Orthopedics  Palliative care.   Procedures:   None   Antimicrobials:   Vancomycin and levaquin on 3/29   Subjective: ver confused, not eating today  Objective: Vitals:   11/18/16 0435 11/18/16 1400 11/18/16 2100 11/19/16 0630  BP: 139/75 140/68 140/86 140/67  Pulse: 72 76 77 75  Resp: Temp: 98.4 F (36.9 C) 98.8 F (37.1 C) 98.7 F (37.1 C) 98.6 F (37 C)  TempSrc: Oral Oral Oral Oral  SpO2: 95% 98% 96% 98%  Weight:      Height:        Intake/Output Summary (Last 24 hours) at 11/19/16 1430 Last data filed at 11/19/16 0900  Gross per 24 hour  Intake              460 ml  Output                0 ml  Net              460 ml   Filed Weights   11/13/16 0646 11/13/16 1227  Weight: 46.3 kg (102 lb) 46.4 kg (102 lb 4.8 oz)    Examination:  General exam: Appears calm and comfortable ON RA. Ecchymosis over the right side of the face.  Respiratory system: bilateral rhonchi.  Diminished at bases. Respiratory effort normal. Cardiovascular system: S1 & S2 heard, RRR. No JVD, murmurs, rubs, gallops or clicks. No pedal edema. Gastrointestinal system: Abdomen is nondistended, soft and nontender. No organomegaly or masses felt. Normal bowel sounds heard. Central nervous system: confused, No focal neurological deficits. Extremities: bilateral upper extremities in sling/ splint. Skin: ecchymosis over right face with swelling Psychiatry: confused    Data Reviewed: I have personally reviewed following labs and imaging studies  CBC:  Recent Labs Lab 11/13/16 0705 11/14/16 0431 11/15/16 0606 11/16/16 0612  WBC 14.0* 11.0* 13.6* 15.1*  NEUTROABS 9.0*  --   --   --   HGB 11.9* 8.8* 8.6* 8.7*  HCT 35.3* 25.8* 25.8* 25.8*  MCV 96.7 95.9 97.0 97.0  PLT 230 188 190 180   Basic Metabolic Panel:  Recent Labs Lab 11/13/16 0705  11/14/16 0431 11/15/16 0606 11/16/16 0612  NA 134* 137 138 138  K 4.5 4.1 4.1 3.9  CL 102 107 107 106  CO2 GLUCOSE 110* 138* 115* 110*  BUN 30* 29* 26* 32*  CREATININE 1.72* 1.20* 1.07* 1.19*  CALCIUM 9.2 8.3* 8.4* 8.5*   GFR: Estimated Creatinine Clearance: 20.7 mL/min (A) (by C-G formula based on SCr of 1.19 mg/dL (H)). Liver Function Tests: No results for input(s): AST, ALT, ALKPHOS, BILITOT, PROT, ALBUMIN in the last 168 hours. No results for input(s): LIPASE, AMYLASE in the last 168 hours. No results for input(s): AMMONIA in the last 168 hours. Coagulation Profile: No results for input(s): INR, PROTIME in the last 168 hours. Cardiac Enzymes: No results for input(s): CKTOTAL, CKMB, CKMBINDEX, TROPONINI in the last 168 hours. BNP (last 3 results) No results for input(s): PROBNP in the last 8760 hours. HbA1C:  No results for input(s): HGBA1C in the last 72 hours. CBG: No results for input(s): GLUCAP in the last 168 hours. Lipid Profile: No results for input(s): CHOL, HDL, LDLCALC, TRIG, CHOLHDL, LDLDIRECT in the last 72 hours. Thyroid Function Tests: No results for input(s): TSH, T4TOTAL, FREET4, T3FREE, THYROIDAB in the last 72 hours. Anemia Panel: No results for input(s): VITAMINB12, FOLATE, FERRITIN, TIBC, IRON, RETICCTPCT in the last 72 hours. Sepsis Labs: No results for input(s): PROCALCITON, LATICACIDVEN in the last 168 hours.  Recent Results (from the past 240 hour(s))  MRSA PCR Screening     Status: None   Collection Time: 11/13/16 11:58 AM  Result Value Ref Range Status   MRSA by PCR NEGATIVE NEGATIVE Final    Comment:        The GeneXpert MRSA Assay (FDA approved for NASAL specimens only), is one component of a comprehensive MRSA colonization surveillance program. It is not intended to diagnose MRSA infection nor to guide or monitor treatment for MRSA infections.          Radiology Studies: Dg Chest Port 1 View  Result Date:  11/17/2016 CLINICAL DATA:  Fever.  Hypertension. EXAM: PORTABLE CHEST 1 VIEW COMPARISON:  One-view chest x-ray 11/15/2016 FINDINGS: Heart size is normal. Aeration at the left base is improved. Asymmetric right-sided density remains. Underlying chronic interstitial coarsening is present. Degenerative changes are again seen in the shoulders. IMPRESSION: 1. Improving aeration at the left base. 2. Persistent asymmetric right-sided airspace disease. Infection is not excluded. Electronically Signed   By: Marin Roberts M.D.   On: 11/17/2016 16:03        Scheduled Meds: . donepezil  10 mg Oral Daily  . escitalopram  10 mg Oral Daily  . heparin  5,000 Units Subcutaneous Q8H  . levofloxacin (LEVAQUIN) IV  500 mg Intravenous Q48H  . levothyroxine  125 mcg Oral QAC breakfast  . loperamide  2 mg Oral BID  . loratadine  10 mg Oral Daily  . losartan  50 mg Oral Daily  . mouth rinse  15 mL Mouth Rinse BID  . memantine  10 mg Oral BID  . vancomycin  500 mg Intravenous Q48H   Continuous Infusions:    LOS: 6 days    Time spent:    Arlyne Brandes, MD [facility] Pager [phone]  If 7PM-7AM, please contact night-coverage [email or web address] Password [email or web address] 11/19/2016, 2:30 PM

## 2016-11-20 DIAGNOSIS — J9600 Acute respiratory failure, unspecified whether with hypoxia or hypercapnia: Secondary | ICD-10-CM

## 2016-11-20 NOTE — Progress Notes (Signed)
Pharmacy Antibiotic Note  [name] is a 83 y.o. female admitted on 11/13/2016 with pneumonia.  Pharmacy has been consulted for Vancomycin dosing.  Plan: Vancomycin 500 mg IV q48 hours  Also on Levaquin  IV q48hr F/U cxs and clinical progress Consider dc vanc as MRSA pcr was negative  Height:  (157.5 cm) Weight: 102 lb 4.8 oz (46.4 kg) IBW/kg (Calculated) : 50.1  Temp (24hrs), Avg:97.9 F (36.6 C), Min:97.8 F (36.6 C), Max:98 F (36.7 C)   Recent Labs Lab 11/14/16 0431 11/15/16 0606 11/16/16 0612  WBC 11.0* 13.6* 15.1*  CREATININE 1.20* 1.07* 1.19*    Estimated Creatinine Clearance: 20.7 mL/min (A) (by C-G formula based on SCr of 1.19 mg/dL (H)).    Allergies  Allergen Reactions  . Amoxicillin-Pot Clavulanate     REACTION: UNKNOWN REACTION  . Cephalexin     REACTION: UNKNOWN REACTION  . Nsaids     UNKNOWN REACTION    Antimicrobials this admission: Vancomycin 3/28 >> Levaquin 3/28 >>  Dose adjustments this admission: N/A  Microbiology results: 11/13/16 MRSA PCR: negative  Thank you for allowing pharmacy to be a part of this patient's care.  Talbert Cage, Ilda Basset D Clinical Pharmacist 11/20/2016 8:50 AM

## 2016-11-20 NOTE — Progress Notes (Signed)
PROGRESS NOTE    [name]  ZOX:[id number] DOB: August 26, 1919 DOA: 11/13/2016 PCP: [name], MD    Brief Narrative:  83 y/o female resident of ALF, was brought to ED after a fall. She sustained several injuries as described below. Orthopedics consulted for  bilateral upper extremity fractures, recommended non surgical treatment and outpatient follow up in 2 weeks. Marland Kitchen She was also dehydrated with AKI, which has improved with IV fluids.Will likely need SNF on discharge when she is ready.. Meanwhile pt developed low grade temp and worsening leukocytosis and CXR showed possible right sided pneumonia. She is being treated for hcap.    Assessment & Plan:   Active Problems:   Essential hypertension   Hypothyroidism   Anemia   Fever   Dementia   Acute respiratory failure with hypoxia (HCC)   CKD (chronic kidney disease) stage 3, GFR 30-59 ml/min   AKI (acute kidney injury) (HCC)   Fall   Right orbital fracture (HCC)   Right maxillary fracture (HCC)   Closed fracture of left proximal humerus   Right radial fracture   Laceration of right ring finger without foreign body without damage to nail   Closed nondisplaced fracture of proximal phalanx of right ring finger   Palliative care encounter   Goals of care, counseling/discussion    Right orbital fracture/right maxillary fracture. Case was discussed by Dr.Mesner with Dr. Annalee Genta on call for ENT. It was felt that patient's injuries were nonoperative and the patient should follow-up with ENT in the office in 10 days. Soft diet was recommended. It was advised the patient not blow her nose.pain well controlled.   Right radial fracture/left humerus fracture. Currently in sling/splint. Orthopedics has been consulted and recommended non operative management. Pain control. And recommended follow up with ortho in 10 days as outpatient. PT eval recommended SNF placement.   Chronic kidney disease stage III with acute kidney injury.  Suspect is related to volume depletion. Improved with IV fluids.   Acute respiratory failure with hypoxia. Patient was admitted to the hospital on room air.  On the night of 3/ 26 pt had some respiratory distress, and was put on 4 lit of [place] oxygen, currently off oxygen. , . But she has low grade fevers, and wbc count is getting worse. Repeat CXR and UA ordered. CXR shows right sided asymmetric opacity, will treat her for health care associated pneumonia.  Resume antibiotics.   Hypothyroidism. Continue Synthroid  Hypertension. Stable. Continue outpatient regimen  Fall. The patient has sustained several injuries. Case was discussed with Dr. Dwain Sarna on call for trauma surgery who agreed with keeping the patient at [facility] . Physical therapy has been consulted. She has been receiving IV morphine for pain control, but due to excess sedation, changed to po meds. She will likely need a higher level of care on discharge.   Anemia. Patient has had a significant decline in hemoglobin. She has had some bleeding from facial injuries. There is also likely an element of hemodilution, since she presented with dehydration. Anemia panel shows elevated ferritin and hemoglobin stabilized..  Suspect anemia is probably secondary to a combination of chronic disease and some bleeding from facial injuries. Continue to monitor. No new labs ordered today.    Dementia. No agitation. Stable.   Low grade fevers and worsening wbc count: CXR shows asymmetric right air space disease. slp evaluation recommended starting dysphagia 1 diet.  Started her on IV antibiotics for hcap. Patient's son does not want  any blood draws at this time.   Deconditioning: in view of the above morbidities, discussed with the patient's family and requested palliative care consult for goals of care and symptom management.  Palliative care meeting scheduled for Monday. No new changes int he last 24 hours, her oral intake has decreased and  she is more confused today.      DVT prophylaxis: heparin Code Status: DNR, if she fails to improve, family would want to consider comfort measures Family Communication: none at bedside today.  Disposition Plan: will likely need SNF   Consultants:   Orthopedics  Palliative care.   Procedures:   None   Antimicrobials:   Vancomycin and levaquin on 3/29   Subjective: Minimal po intake.    Objective: Vitals:   11/19/16 1434 11/19/16 2100 11/20/16 0524 11/20/16 1326  BP: (!) 140/54 (!) 191/48 93/71 108/64  Pulse:  77 80 74  Resp: Temp: 97.8 F (36.6 C) 98 F (36.7 C) 98 F (36.7 C) 98 F (36.7 C)  TempSrc: Oral Oral Oral Oral  SpO2:  94% 100% 98%  Weight:      Height:        Intake/Output Summary (Last 24 hours) at 11/20/16 1552 Last data filed at 11/20/16 1200  Gross per 24 hour  Intake              360 ml  Output                0 ml  Net              360 ml   Filed Weights   11/13/16 0646 11/13/16 1227  Weight: 46.3 kg (102 lb) 46.4 kg (102 lb 4.8 oz)    Examination:  General exam: Appears calm and comfortable ON RA. Ecchymosis over the right side of the face.  Respiratory system: bilateral rhonchi.  Diminished at bases. Respiratory effort normal. Cardiovascular system: S1 & S2 heard, RRR. No JVD, murmurs, rubs, gallops or clicks. No pedal edema. Gastrointestinal system: Abdomen is nondistended, soft and nontender. No organomegaly or masses felt. Normal bowel sounds heard. Central nervous system: confused, No focal neurological deficits. Extremities: bilateral upper extremities in sling/ splint. Skin: ecchymosis over right face with swelling Psychiatry: confused    Data Reviewed: I have personally reviewed following labs and imaging studies  CBC:  Recent Labs Lab 11/14/16 0431 11/15/16 0606 11/16/16 0612  WBC 11.0* 13.6* 15.1*  HGB 8.8* 8.6* 8.7*  HCT 25.8* 25.8* 25.8*  MCV 95.9 97.0 97.0  PLT 188 190 180   Basic  Metabolic Panel:  Recent Labs Lab 11/14/16 0431 11/15/16 0606 11/16/16 0612  NA 137 138 138  K 4.1 4.1 3.9  CL 107 107 106  CO2 GLUCOSE 138* 115* 110*  BUN 29* 26* 32*  CREATININE 1.20* 1.07* 1.19*  CALCIUM 8.3* 8.4* 8.5*   GFR: Estimated Creatinine Clearance: 20.7 mL/min (A) (by C-G formula based on SCr of 1.19 mg/dL (H)). Liver Function Tests: No results for input(s): AST, ALT, ALKPHOS, BILITOT, PROT, ALBUMIN in the last 168 hours. No results for input(s): LIPASE, AMYLASE in the last 168 hours. No results for input(s): AMMONIA in the last 168 hours. Coagulation Profile: No results for input(s): INR, PROTIME in the last 168 hours. Cardiac Enzymes: No results for input(s): CKTOTAL, CKMB, CKMBINDEX, TROPONINI in the last 168 hours. BNP (last 3 results) No results for input(s): PROBNP in the last 8760 hours. HbA1C: No  results for input(s): HGBA1C in the last 72 hours. CBG: No results for input(s): GLUCAP in the last 168 hours. Lipid Profile: No results for input(s): CHOL, HDL, LDLCALC, TRIG, CHOLHDL, LDLDIRECT in the last 72 hours. Thyroid Function Tests: No results for input(s): TSH, T4TOTAL, FREET4, T3FREE, THYROIDAB in the last 72 hours. Anemia Panel: No results for input(s): VITAMINB12, FOLATE, FERRITIN, TIBC, IRON, RETICCTPCT in the last 72 hours. Sepsis Labs: No results for input(s): PROCALCITON, LATICACIDVEN in the last 168 hours.  Recent Results (from the past 240 hour(s))  MRSA PCR Screening     Status: None   Collection Time: 11/13/16 11:58 AM  Result Value Ref Range Status   MRSA by PCR NEGATIVE NEGATIVE Final    Comment:        The GeneXpert MRSA Assay (FDA approved for NASAL specimens only), is one component of a comprehensive MRSA colonization surveillance program. It is not intended to diagnose MRSA infection nor to guide or monitor treatment for MRSA infections.          Radiology Studies: No results found.      Scheduled  Meds: . donepezil  10 mg Oral Daily  . escitalopram  10 mg Oral Daily  . heparin  5,000 Units Subcutaneous Q8H  . levofloxacin (LEVAQUIN) IV  500 mg Intravenous Q48H  . levothyroxine  125 mcg Oral QAC breakfast  . loperamide  2 mg Oral BID  . loratadine  10 mg Oral Daily  . losartan  50 mg Oral Daily  . mouth rinse  15 mL Mouth Rinse BID  . memantine  10 mg Oral BID  . vancomycin  500 mg Intravenous Q48H   Continuous Infusions:    LOS: 7 days    Time spent:    Jamere Stidham, MD [facility] Pager [phone]  If 7PM-7AM, please contact night-coverage [email or web address] Password [email or web address] 11/20/2016, 3:52 PM

## 2016-11-21 DIAGNOSIS — Z7189 Other specified counseling: Secondary | ICD-10-CM

## 2016-11-21 MED ORDER — MORPHINE SULFATE (CONCENTRATE) 10 MG/0.5ML PO SOLN: 2.5000 mg | ORAL | Status: DC | PRN

## 2016-11-21 NOTE — Progress Notes (Signed)
PROGRESS NOTE    [name]  ZOX:[id number] DOB: 11/01/1919 DOA: 11/13/2016 PCP: [name], MD    Brief Narrative:  83 y/o female resident of ALF, was brought to ED after a fall. She sustained several injuries as described below. Orthopedics consulted for  bilateral upper extremity fractures, recommended non surgical treatment and outpatient follow up in 2 weeks. Marland Kitchen She was also dehydrated with AKI, which has improved with IV fluids.Will likely need SNF on discharge when she is ready.. Meanwhile pt developed low grade temp and worsening leukocytosis and CXR showed possible right sided pneumonia.  She was started on IV antibiotics for hcap. Palliative care consulted for goals of care and symptom management. Family meeting today at 2:30 and decided to transition to comfort care at this time.   Assessment & Plan:   Active Problems:   Essential hypertension   Hypothyroidism   Anemia   Fever   Dementia   Acute respiratory failure with hypoxia (HCC)   CKD (chronic kidney disease) stage 3, GFR 30-59 ml/min   AKI (acute kidney injury) (HCC)   Fall   Right orbital fracture (HCC)   Right maxillary fracture (HCC)   Closed fracture of left proximal humerus   Right radial fracture   Laceration of right ring finger without foreign body without damage to nail   Closed nondisplaced fracture of proximal phalanx of right ring finger   Palliative care encounter   Goals of care, counseling/discussion   Encounter for hospice care discussion    Right orbital fracture/right maxillary fracture. Case was discussed by Dr.Mesner with Dr. Annalee Genta on call for ENT. It was felt that patient's injuries were nonoperative and the patient should follow-up with ENT in the office in 10 days.        Right radial fracture/left humerus fracture. Currently in sling/splint. Orthopedics has been consulted and recommended non operative management. Pain control. And recommended follow up with ortho in 10  days as outpatient as needed. PT eval recommended SNF placement.  Family has decided tro transition to comfort care.     Chronic kidney disease stage III with acute kidney injury. Suspect is related to volume depletion. Improved with IV fluids. D/c IV fluids.     Acute respiratory failure with hypoxia. Patient was admitted to the hospital on room air.  On the night of 3/ 26 pt had some respiratory distress, and was put on 4 lit of [place] oxygen, currently off oxygen. , . But she has low grade fevers, and wbc count is getting worse. Repeat CXR and UA ordered. CXR shows right sided asymmetric opacity, started antibiotics for HCAP. Palliative meeting on 4/2, transition to comfort care.     Hypothyroidism. Continue Synthroid  Hypertension. Stable. Continue outpatient regimen Fall. The patient has sustained several injuries. Not a candidate for surgical repair. Comfort care.    Anemia. Patient has had a significant decline in hemoglobin. She has had some bleeding from facial injuries. There is also likely an element of hemodilution, since she presented with dehydration. Anemia panel shows elevated ferritin and hemoglobin stabilized..  Suspect anemia is probably secondary to a combination of chronic disease and some bleeding from facial injuries. Continue to monitor. No new labs ordered today.    Dementia. No agitation. Stable.   Low grade fevers and worsening wbc count: CXR shows asymmetric right air space disease. slp evaluation recommended starting dysphagia 1 diet.  Started her on IV antibiotics for hcap. Patient's son does not want any blood draws  at this time.  Deconditioning: in view of the above morbidities, discussed with the patient's family and requested palliative care consult for goals of care and symptom management.  Palliative care meeting scheduled for Monday.and plan for hospice home discharge in am.    DVT prophylaxis: heparin Code Status: DNR,  Family Communication: none at  bedside today.  Disposition Plan: hospice home in am.    Consultants:   Orthopedics  Palliative care.   Procedures:   None   Antimicrobials:   Vancomycin and levaquin on 3/29   Subjective: Minimal po intake. Sleeping soundly.    Objective: Vitals:   11/20/16 1326 11/20/16 2153 11/21/16 0652 11/21/16 1408  BP: 108/64 (!) 149/77 (!) 141/92 (!) 162/79  Pulse: 74 74 (!) 59 60  Resp: Temp: 98 F (36.7 C) 98.3 F (36.8 C) 98.3 F (36.8 C) 98.6 F (37 C)  TempSrc: Oral Oral Axillary Axillary  SpO2: 98% 92% 93% 91%  Weight:      Height:        Intake/Output Summary (Last 24 hours) at 11/21/16 1636 Last data filed at 11/21/16 1245  Gross per 24 hour  Intake              320 ml  Output                0 ml  Net              320 ml   Filed Weights   11/13/16 0646 11/13/16 1227  Weight: 46.3 kg (102 lb) 46.4 kg (102 lb 4.8 oz)    Examination:  General exam:sleeping and comfortable. Ecchymosis over the right side of the face.  Respiratory system: bilateral rhonchi.  Diminished at bases. Respiratory effort normal. Cardiovascular system: S1 & S2 heard, RRR. No JVD, murmurs, rubs, gallops or clicks. No pedal edema. Gastrointestinal system: Abdomen is nondistended, soft and nontender. No organomegaly or masses felt. Normal bowel sounds heard. Central nervous system: sleeping comfortably.  Extremities: bilateral upper extremities in sling/ splint. Skin: ecchymosis over right face with swelling     Data Reviewed: I have personally reviewed following labs and imaging studies  CBC:  Recent Labs Lab 11/15/16 0606 11/16/16 0612  WBC 13.6* 15.1*  HGB 8.6* 8.7*  HCT 25.8* 25.8*  MCV 97.0 97.0  PLT 190 180   Basic Metabolic Panel:  Recent Labs Lab 11/15/16 0606 11/16/16 0612  NA 138 138  K 4.1 3.9  CL 107 106  CO2 22 25  GLUCOSE 115* 110*  BUN 26* 32*  CREATININE 1.07* 1.19*  CALCIUM 8.4* 8.5*   GFR: Estimated Creatinine Clearance: 20.7  mL/min (A) (by C-G formula based on SCr of 1.19 mg/dL (H)). Liver Function Tests: No results for input(s): AST, ALT, ALKPHOS, BILITOT, PROT, ALBUMIN in the last 168 hours. No results for input(s): LIPASE, AMYLASE in the last 168 hours. No results for input(s): AMMONIA in the last 168 hours. Coagulation Profile: No results for input(s): INR, PROTIME in the last 168 hours. Cardiac Enzymes: No results for input(s): CKTOTAL, CKMB, CKMBINDEX, TROPONINI in the last 168 hours. BNP (last 3 results) No results for input(s): PROBNP in the last 8760 hours. HbA1C: No results for input(s): HGBA1C in the last 72 hours. CBG: No results for input(s): GLUCAP in the last 168 hours. Lipid Profile: No results for input(s): CHOL, HDL, LDLCALC, TRIG, CHOLHDL, LDLDIRECT in the last 72 hours. Thyroid Function Tests: No results for input(s): TSH, T4TOTAL, FREET4, T3FREE,  THYROIDAB in the last 72 hours. Anemia Panel: No results for input(s): VITAMINB12, FOLATE, FERRITIN, TIBC, IRON, RETICCTPCT in the last 72 hours. Sepsis Labs: No results for input(s): PROCALCITON, LATICACIDVEN in the last 168 hours.  Recent Results (from the past 240 hour(s))  MRSA PCR Screening     Status: None   Collection Time: 11/13/16 11:58 AM  Result Value Ref Range Status   MRSA by PCR NEGATIVE NEGATIVE Final    Comment:        The GeneXpert MRSA Assay (FDA approved for NASAL specimens only), is one component of a comprehensive MRSA colonization surveillance program. It is not intended to diagnose MRSA infection nor to guide or monitor treatment for MRSA infections.          Radiology Studies: No results found.      Scheduled Meds: . levothyroxine  125 mcg Oral QAC breakfast  . loperamide  2 mg Oral BID  . losartan  50 mg Oral Daily  . mouth rinse  15 mL Mouth Rinse BID   Continuous Infusions:    LOS: 8 days    Time spent:    Jacari Iannello, MD [facility] Pager [phone]  If  7PM-7AM, please contact night-coverage [email or web address] Password [facility] 11/21/2016, 4:36 PM

## 2016-11-21 NOTE — Plan of Care (Signed)
Problem: Skin Integrity: Goal: Risk for impaired skin integrity will decrease Outcome: Progressing Pts bruising resolving. Pt turned q2h this shift to prevent skin breakdown. Pt yelling out all night; turning pt to make her more comfortable.

## 2016-11-21 NOTE — Progress Notes (Signed)
Daily Progress Note   Patient Name: [name]       Date: 11/21/2016 DOB: 06/24/1920  Age: 83 y.o. MRN#: [id number] Attending Physician: [name], MD Primary Care Physician: [name], MD Admit Date: 11/13/2016  Reason for Consultation/Follow-up: Establishing goals of care and Psychosocial/spiritual support  Subjective: [name] is resting quietly in bed. There is no family at bedside at this time, and she does not wake when I call her name or touch her. Returned later in the day for scheduled family meeting. Present at bedside today are daughter-in-law Erskine Squibb, son Jean Rosenthal and his wife Alexia Freestone. We go to my office for a family meeting. We review labs, health history. We talk about [name]'s multiple fractures, her dysphagia, and projected outcomes. I share my worry about continued aspiration ([name] is pocketing food). Family states their desire is to focus on comfort and dignity, they share their concern over [name]'s ability to rehab. We talk about PNC/rehab as private pay with hospice, we also talk about hospice home of [facility]. We talk about the benefits of inpatient hospice in detail. I share that hospice focuses on comfort and dignity, and will not provide IV fluids, antibiotics, blood thinner injections, or medications that are not focused on comfort.  Daughter-in-law Erskine Squibb exits the room to call her husband who is it home recuperating from the neck strain. When she returned she states that her husband, [name], agrees to inpatient hospice. Son Jean Rosenthal and his wife also agree to inpatient hospice at [facility].  Length of Stay: 8  Current Medications: Scheduled Meds:  . donepezil  10 mg Oral Daily  . escitalopram  10 mg Oral Daily  . heparin   5,000 Units Subcutaneous Q8H  . levofloxacin (LEVAQUIN) IV  500 mg Intravenous Q48H  . levothyroxine  125 mcg Oral QAC breakfast  . loperamide  2 mg Oral BID  . loratadine  10 mg Oral Daily  . losartan  50 mg Oral Daily  . mouth rinse  15 mL Mouth Rinse BID  . memantine  10 mg Oral BID  . vancomycin  500 mg Intravenous Q48H    Continuous Infusions:   PRN Meds: acetaminophen **OR** acetaminophen, HYDROcodone-acetaminophen, magnesium hydroxide, ondansetron **OR** ondansetron (ZOFRAN) IV  Physical Exam  Constitutional: No distress.  Lethargic today, likely  related to pain medication.  HENT:  Head: Normocephalic.  Temporal wasting, Multiple bruising, green, to right side of face.  Cardiovascular: Normal rate.   Pulmonary/Chest: Effort normal. No respiratory distress.  Abdominal: Soft. She exhibits no distension.  Musculoskeletal:  Multiple fractures including right wrist, left humorous, multiple facial fractures  Neurological:  Lethargic, Atkins eyes to command  Skin: Skin is dry.  Multiple bruises consistent with fall  Nursing note and vitals reviewed.           Vital Signs: BP (!) 141/92 (BP Location: Right Leg)   Pulse (!) 59   Temp 98.3 F (36.8 C) (Axillary)   Resp 16   Ht  (1.575 m)   Wt 46.4 kg (102 lb 4.8 oz)   SpO2 93%   BMI 18.71 kg/m  SpO2: SpO2: 93 % O2 Device: O2 Device: Not Delivered O2 Flow Rate: O2 Flow Rate (L/min): 3 L/min  Intake/output summary:  Intake/Output Summary (Last 24 hours) at 11/21/16 1353 Last data filed at 11/21/16 0930  Gross per 24 hour  Intake              320 ml  Output                0 ml  Net              320 ml   LBM: Last BM Date:  (unsure) Baseline Weight: Weight: 46.3 kg (102 lb) Most recent weight: Weight: 46.4 kg (102 lb 4.8 oz)       Palliative Assessment/Data:    Flowsheet Rows     Most Recent Value  Intake Tab  Referral Department  Hospitalist  Unit at Time of Referral  Med/Surg Unit  Palliative  Care Primary Diagnosis  Trauma  Date Notified  11/18/16  Palliative Care Type  New Palliative care  Reason for referral  Clarify Goals of Care  Date of Admission  11/13/16  Date first seen by Palliative Care  11/18/16  # of days Palliative referral response time  0 Day(s)  # of days IP prior to Palliative referral  5  Clinical Assessment  Palliative Performance Scale Score  30%  Pain Max last 24 hours  Not able to report  Pain Min Last 24 hours  Not able to report  Dyspnea Max Last 24 Hours  Not able to report  Dyspnea Min Last 24 hours  Not able to report  Psychosocial & Spiritual Assessment  Palliative Care Outcomes  Patient/Family meeting held?  Yes  Who was at the meeting?  DIL at bedside  Palliative Care Outcomes  Provided psychosocial or spiritual support, Clarified goals of care  Patient/Family wishes: Interventions discontinued/not started   Mechanical Ventilation      Patient Active Problem List   Diagnosis Date Noted  . Palliative care encounter   . Goals of care, counseling/discussion   . Laceration of right ring finger without foreign body without damage to nail   . Closed nondisplaced fracture of proximal phalanx of right ring finger   . AKI (acute kidney injury) (HCC) 11/13/2016  . Fall 11/13/2016  . Right orbital fracture (HCC) 11/13/2016  . Right maxillary fracture (HCC) 11/13/2016  . Closed fracture of left proximal humerus 11/13/2016  . Right radial fracture 11/13/2016  . Delirium 02/19/2016  . Fracture of multiple pubic rami (HCC) 02/16/2016  . PUD (peptic ulcer disease) 02/16/2016  . Hyponatremia 02/16/2016  . Infection of urinary tract 02/16/2016  . Acute respiratory failure with hypoxia (  HCC) 06/07/2015  . GERD (gastroesophageal reflux disease) 06/07/2015  . CKD (chronic kidney disease) stage 3, GFR 30-59 ml/min 06/07/2015  . HCAP (healthcare-associated pneumonia) 06/06/2015  . Fever 05/21/2012  . Encephalopathy acute 05/21/2012  . Dementia  05/21/2012  . History of fracture of right hip 05/21/2012  . History of subdural hematoma (post traumatic) 05/21/2012  . Hemiparesis (HCC) 05/15/2012  . Hypoxemia 05/15/2012  . Hypothyroidism 05/14/2012  . Anemia 05/14/2012  . GASTRIC ULCER 09/23/2009  . GI BLEEDING 08/20/2009  . DIARRHEA, ACUTE 08/18/2009  . Peptic ulcer 07/31/2009  . HEMATEMESIS 07/31/2009  . Essential hypertension 07/30/2009  . DIVERTICULOSIS OF COLON 07/30/2009  . ARTHRITIS, BACK 07/30/2009  . OSTEOPOROSIS 07/30/2009  . DYSPHAGIA UNSPECIFIED 07/30/2009    Palliative Care Assessment & Plan   Patient Profile: 83 y.o. female  with past medical history of kidney disease stage III, mild to moderate dementia, E. coli UTI, healthcare associated pneumonia, right hip fracture in 2013, hypertension, macular degeneration, history of G.I. bleed, [facility] since 2010 admitted on 11/13/2016 with fall with multiple fractures.   Assessment: Multiple fractures status post fall; not a surgical candidate at this time.  Frailty; known mild to moderate dementia, functional decline with falls, not a candidate to return to ALF.   Recommendations/Plan:  family is requesting Transition to comfort care, [facility] home.  Goals of Care and Additional Recommendations:  Limitations on Scope of Treatment: Full Comfort Care  Code Status:    Code Status Orders        Start     Ordered   11/13/16 1720  Do not attempt resuscitation (DNR)  Continuous    Question Answer Comment  In the event of cardiac or respiratory ARREST Do not call a "code blue"   In the event of cardiac or respiratory ARREST Do not perform Intubation, CPR, defibrillation or ACLS   In the event of cardiac or respiratory ARREST Use medication by any route, position, wound care, and other measures to relive pain and suffering. May use oxygen, suction and manual treatment of airway obstruction as needed for comfort.      11/13/16 1738    Code  Status History    Date Active Date Inactive Code Status Order ID Comments User Context   02/17/2016  4:43 PM 02/19/2016  7:11 PM DNR [id number]  Henderson Cloud, MD Inpatient   02/16/2016  5:48 PM 02/17/2016  4:43 PM Full Code [id number]  Elliot Cousin, MD Inpatient   06/06/2015 11:55 PM 06/08/2015  7:47 PM DNR [id number]  Houston Siren, MD Inpatient   05/21/2012  7:56 PM 05/24/2012  6:02 PM DNR [id number]  Osvaldo Shipper, MD Inpatient    Advance Directive Documentation     Most Recent Value  Type of Advance Directive  Out of facility DNR (pink MOST or yellow form)  Pre-existing out of facility DNR order (yellow form or pink MOST form)  Yellow form placed in chart (order not valid for inpatient use)  "MOST" Form in Place?  -       Prognosis:   < 4 weeks or less would not be surprising based on multiple fractures S/P fall, frailty, pneumonia, dysphagia with restricted diet.  Discharge Planning:  Hospice home of [facility] plan was discussed with nursing staff, case manager, social worker, and Dr. Blake Divine.   Thank you for allowing the Palliative Medicine Team to assist in the care of this patient.   Time In: 1430 Time Out: 1540 Total  Time 70 minutes Prolonged Time Billed  yes       Greater than 50%  of this time was spent counseling and coordinating care related to the above assessment and plan.  Katheran Awe, NP  Please contact Palliative Medicine Team phone at [phone] for questions and concerns.

## 2016-11-21 NOTE — Clinical Social Work Note (Signed)
LCSW was advised that a decision had not yet been made about private pay placement at [facility]. LCSW was advised that family had a meeting at 2:30 today with palliative care and will make a decision after today's meeting.   Jaimen Melone, Juleen China, LCSW

## 2016-11-22 DIAGNOSIS — S62644D Nondisplaced fracture of proximal phalanx of right ring finger, subsequent encounter for fracture with routine healing: Secondary | ICD-10-CM

## 2016-11-22 MED ORDER — MORPHINE SULFATE (CONCENTRATE) 10 MG/0.5ML PO SOLN: 2.5000 mg | ORAL | Status: AC | PRN

## 2016-11-22 NOTE — Progress Notes (Signed)
SLP Cancellation Note  Patient Details Name: RACHELLE EDWARDS MRN: [id number] DOB: 1920-05-14   Cancelled treatment:       Reason Eval/Treat Not Completed: Other (comment); Acknowledge Pt transitioning to comfort care. SLP will sign off.  Thank you,  Havery Moros, CCC-SLP [phone]    PORTER,DABNEY 11/22/2016, 9:55 AM

## 2016-11-22 NOTE — Discharge Summary (Signed)
Physician Discharge Summary  [name] ZOX:[id number] DOB: [birth year] DOA: 11/13/2016  PCP: [name], MD  Admit date: 11/13/2016 Discharge date: 11/22/2016  Admitted From: SNF Disposition: hospice home.   Recommendations for Outpatient Follow-up:  Please follow up with hospice MD as needed.   Discharge Condition:HOSPICE CODE STATUS:COOMFORT CARE Diet recommendation: COMFORT FEEDS.   Brief/Interim Summary: 83 y/o female resident of ALF, was brought to ED after a fall. She sustained several injuries as described below. Orthopedics consulted for  bilateral upper extremity fractures, recommended non surgical treatment and outpatient follow up in 2 weeks. Marland Kitchen She was also dehydrated with AKI, which has improved with IV fluids.Will likely need SNF on discharge when she is ready.. Meanwhile pt developed low grade temp and worsening leukocytosis and CXR showed possible right sided pneumonia.  She was started on IV antibiotics for hcap. Palliative care consulted for goals of care and symptom management. Family meeting on 4/2 at 2:30 and decided to transition to comfort care at this time.    Discharge Diagnoses:  Active Problems:   Essential hypertension   Hypothyroidism   Anemia   Fever   Dementia   Acute respiratory failure with hypoxia (HCC)   CKD (chronic kidney disease) stage 3, GFR 30-59 ml/min   AKI (acute kidney injury) (HCC)   Fall   Right orbital fracture (HCC)   Right maxillary fracture (HCC)   Closed fracture of left proximal humerus   Right radial fracture   Laceration of right ring finger without foreign body without damage to nail   Closed nondisplaced fracture of proximal phalanx of right ring finger   Palliative care encounter   Goals of care, counseling/discussion   Encounter for hospice care discussion   Right orbital fracture/right maxillary fracture. Case was discussed by Dr.Mesner with Dr. Annalee Genta on call for ENT. It was felt that patient's injuries  were nonoperative and the patient should follow-up with ENT in the office in 10 days.        Right radial fracture/left humerus fracture. Currently in sling/splint. Orthopedics has been consulted and recommended non operative management. Pain control. And recommended follow up with ortho in 10 days as outpatient as needed. PT eval recommended SNF placement.  Family has decided tro transition to comfort care.     Chronic kidney disease stage III with acute kidney injury. Suspect is related to volume depletion. Improved with IV fluids. D/c IV fluids.     Acute respiratory failure with hypoxia. Patient was admitted to the hospital on room air.  On the night of 3/ 26 pt had some respiratory distress, and was put on 4 lit of [place] oxygen, currently off oxygen. , . But she has low grade fevers, and wbc count is getting worse. Repeat CXR and UA ordered. CXR shows right sided asymmetric opacity, started antibiotics for HCAP. Palliative meeting on 4/2, transition to comfort care.     Hypothyroidism. Continue Synthroid  Hypertension. Stable. Continue outpatient regimen Fall. The patient has sustained several injuries. Not a candidate for surgical repair. Comfort care.    Anemia. Patient has had a significant decline in hemoglobin. She has had some bleeding from facial injuries. There is also likely an element of hemodilution, since she presented with dehydration. Anemia panel shows elevated ferritin and hemoglobin stabilized..  Suspect anemia is probably secondary to a combination of chronic disease and some bleeding from facial injuries. Continue to monitor. No new labs ordered today.    Dementia. No agitation. Stable.   Low  grade fevers and worsening wbc count: CXR shows asymmetric right air space disease. slp evaluation recommended starting dysphagia 1 diet.  Started her on IV antibiotics for hcap. Patient's son does not want any blood draws at this time.  Deconditioning: in view of the  above morbidities, discussed with the patient's family and requested palliative care consult for goals of care and symptom management.  Palliative care meeting scheduled for Monday.and plan for hospice home on discharge  Discharge Instructions  Discharge Instructions    Discharge instructions    Complete by:  As directed    Please follow up with hospice MD as needed.     Allergies as of 11/22/2016      Reactions   Amoxicillin-pot Clavulanate    REACTION: UNKNOWN REACTION   Cephalexin    REACTION: UNKNOWN REACTION   Nsaids    UNKNOWN REACTION      Medication List    STOP taking these medications   DAILY VITE Tabs   loratadine 10 MG tablet Commonly known as:  CLARITIN   Lutein 6 MG Caps   sulfamethoxazole-trimethoprim 800-160 MG tablet Commonly known as:  BACTRIM DS,SEPTRA DS     TAKE these medications   acetaminophen 500 MG tablet Commonly known as:  TYLENOL Take 500 mg by mouth at bedtime. For pain management   BAZA ANTIFUNGAL 2 % cream Generic drug:  miconazole Apply 1 application topically 2 (two) times daily. To buttocks for redness   donepezil 10 MG tablet Commonly known as:  ARICEPT Take 10 mg by mouth daily.   escitalopram 10 MG tablet Commonly known as:  LEXAPRO Take 10 mg by mouth daily.   levothyroxine 125 MCG tablet Commonly known as:  SYNTHROID, LEVOTHROID Take 125 mcg by mouth daily before breakfast.   loperamide 2 MG tablet Commonly known as:  IMODIUM A-D Take 2 mg by mouth 2 (two) times daily.   losartan 50 MG tablet Commonly known as:  COZAAR Take 50 mg by mouth daily.   memantine 10 MG tablet Commonly known as:  NAMENDA Take 10 mg by mouth 2 (two) times daily.   morphine CONCENTRATE 10 MG/0.5ML Soln concentrated solution Take 0.13 mLs (2.6 mg total) by mouth every 4 (four) hours as needed for moderate pain or severe pain.   SYSTANE OP Apply 1 drop to eye 4 (four) times daily.      Contact information for after-discharge care     Destination    [facility] .   Specialty:  Skilled Nursing Facility Contact information: [address] [place] [address] [phone]             Allergies  Allergen Reactions  . Amoxicillin-Pot Clavulanate     REACTION: UNKNOWN REACTION  . Cephalexin     REACTION: UNKNOWN REACTION  . Nsaids     UNKNOWN REACTION    Consultations: Palliative Orthopedics.   Procedures/Studies: Ct Abdomen Pelvis Wo Contrast  Result Date: 11/13/2016 CLINICAL DATA:  Found on floor. Complains of right facial pain with bruising and swelling. Left arm pain. EXAM: CT CHEST, ABDOMEN AND PELVIS WITHOUT CONTRAST TECHNIQUE: Multidetector CT imaging of the chest, abdomen and pelvis was performed following the standard protocol without IV contrast. COMPARISON:  Chest radiograph 11/13/2016. CT pelvis 02/16/2016. Lumbar spine 02/12/2016 FINDINGS: CT CHEST FINDINGS Cardiovascular: Calcifications of the aortic root. Calcifications at the aortic arch. Caliber of the thoracic aorta is within normal limits. Mediastinum/Nodes: No significant chest lymphadenopathy. There is minimal pericardial fluid. Lungs/Pleura: Trachea  and mainstem bronchi are patent. 1.1 x 0.5 cm nodule structure in the right upper lobe on sequence 3, image 49. Small calcified granuloma in the right upper lobe on image 43. Mild scarring at the lung apices. No large areas of airspace disease or lung consolidation. Negative for pneumothorax. Musculoskeletal: Significant degenerative changes and joint space narrowing in the right shoulder joint. There is a large bursal fluid collection around the right shoulder. Displaced and comminuted fracture of the proximal left humerus. Evidence for prior left mastectomy. CT ABDOMEN PELVIS FINDINGS Hepatobiliary: Gallbladder is mildly distended with evidence of stones or sludge. No gross abnormality to the liver on this noncontrast examination. Pancreas: Limited evaluation of  pancreas without gross abnormality. Spleen: Normal appearance of spleen without enlargement. Adrenals/Urinary Tract: Adrenal glands not well visualized. There is quite a bit of motion artifact on this examination. There are least 2 small hyperdense structures involving the left kidney, largest measuring 0.8 cm in the anterior left interpolar region. Negative for hydronephrosis. Stomach/Bowel: The rectum is distended with a large amount stool. Normal caliber of the small bowel and stomach. No evidence for bowel obstruction. Vascular/Lymphatic: Atherosclerotic calcifications of the abdominal aorta without aneurysm. No significant lymph node enlargement in the abdomen or pelvis. Reproductive: Status post hysterectomy. No adnexal masses. Other: No free fluid.  No free air. Musculoskeletal: Surgical fixation in the right hip. Old fracture involving the left inferior pubic ramus. There is a severe compression fracture of the L2 vertebral body with approximately 6 mm of bone retropulsion. This fracture is new from an exam on 02/12/2016. There is approximately 60-70% loss of vertebral body height at L2. Mild disc space narrowing at L4-L5. IMPRESSION: Comminuted and displaced fracture involving the proximal left humerus. Compression fracture of L2 with bone retropulsion. This is new from the previous examinations and this fracture is age indeterminate. No acute intrathoracic or intra-abdominal abnormalities. Limited evaluation of the intra-abdominal structures due to motion and the lack of intravenous contrast. Indeterminate 1.1 cm nodular lesion in the right upper lung. Neoplastic process cannot be excluded. This nodule could be further evaluated with a PET-CT if a neoplastic workup is desired. Hyperdense structures in left kidney. These could represent hemorrhagic or proteinaceous cyst but indeterminate. Left amount of fluid surrounding the right shoulder probably related to a bursal fluid collection. Electronically Signed    By: Richarda Overlie M.D.   On: 11/13/2016 09:08   Dg Chest 1 View  Result Date: 11/13/2016 CLINICAL DATA:  Patient found down at nursing home. EXAM: CHEST 1 VIEW COMPARISON:  Chest radiograph 06/06/2015 FINDINGS: Monitoring leads overlie the patient. Stable enlarged cardiac and mediastinal contours with tortuosity and calcification of the thoracic aorta. No large area of pulmonary consolidation. No pleural effusion or pneumothorax. Displaced proximal left humeral diaphysis fracture. IMPRESSION: No acute cardiopulmonary process. Proximal left humerus diaphyseal fracture. See dedicated radiographs. Electronically Signed   By: [name] Belt M.D.   On: 11/13/2016 08:09   Dg Pelvis 1-2 Views  Result Date: 11/13/2016 CLINICAL DATA:  Unwitnessed fall with pain EXAM: PELVIS - 1-2 VIEW COMPARISON:  February 17, 2016 FINDINGS: There is screw and plate fixation in the proximal right femur with alignment in these areas is anatomic. The tip of the screw is in the proximal femoral head. There is no evident acute fracture or dislocation. There is stable moderate narrowing of both hip joints. No erosive change. Bones are diffusely osteoporotic. There is aortic atherosclerosis. IMPRESSION: Postoperative change right femur. No acute fracture or dislocation. Bones  osteoporotic. Symmetric narrowing both hip joints, stable. Aortic atherosclerosis. Electronically Signed   By: Bretta Bang III M.D.   On: 11/13/2016 08:08   Dg Wrist Complete Right  Result Date: 11/13/2016 CLINICAL DATA:  Pain following fall EXAM: RIGHT WRIST - COMPLETE 3+ VIEW COMPARISON:  None. FINDINGS: Frontal, oblique, and lateral views were obtained. There is an impacted fracture of the distal radial metaphysis. There is evidence of what appears to be an old fracture of the distal scaphoid bone with remodeling. No other fractures are evident. There appears to be dislocation at the scaphotrapezial joint. There is evidence of what appears to be chronic  scapholunate disassociation. There is advanced osteoarthritic change in the lateral wrist region. Bones are osteoporotic. There is extensive chondrocalcinosis. IMPRESSION: Acute fracture of the right distal radial metaphysis with impaction at the fracture site. Evidence of old fracture of the distal scaphoid with remodeling in this area. Apparent scaphotrapezial joint dislocation, likely chronic. 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 Advanced arthropathy in the lateral aspect of the wrist joint. Extensive chondrocalcinosis which may be due to either osteoarthritis or calcium pyrophosphate deposition disease. Bones diffusely osteoporotic. Electronically Signed   By: Bretta Bang III M.D.   On: 11/13/2016 08:13   Ct Head Wo Contrast  Result Date: 11/13/2016 CLINICAL DATA:  Pain following fall EXAM: CT HEAD WITHOUT CONTRAST CT MAXILLOFACIAL WITHOUT CONTRAST CT CERVICAL SPINE WITHOUT CONTRAST TECHNIQUE: Multidetector CT imaging of the head, cervical spine, and maxillofacial structures were performed using the standard protocol without intravenous contrast. Multiplanar CT image reconstructions of the cervical spine and maxillofacial structures were also generated. COMPARISON:  Head CT and cervical spine CT February 16, 2016 FINDINGS: CT HEAD FINDINGS Brain: There is moderate diffuse atrophy. There is no intracranial mass, hemorrhage, extra-axial fluid collection, or midline shift. There is patchy small vessel disease in the centra semiovale bilaterally. There is mild small vessel disease in each thalamus. There is no new gray-white compartment lesion. No acute infarct evident. Vascular: No hyperdense vessel. There is calcification in both carotid siphon regions. Skull: Bony calvarium appears intact. Other: Mastoid air cells are clear. CT MAXILLOFACIAL FINDINGS Osseous: There is a comminuted fracture of the lateral orbital wall on the right. There is a comminuted orbital floor fracture on the right with  depressed fracture fragments. There does not appear to be extraocular muscle entrapment from this depressed orbital floor fracture. There is a comminuted anterior maxillary wall fracture on the right with fracture fragments depressed posteriorly into the maxillary antral region. There is a displaced fracture along the medial maxillary antrum on the right with a posterior fragment lateral to the anterior fragment. The pterygoid plate on the right remains intact. However, superior to the pterygoid plate on the right, there is a mildly displaced fracture of the posterior wall of the maxillary antrum on the right. There is a fracture through the midportion of the zygomatic arch on the right. No fractures to the left of midline are demonstrable. There is advanced arthropathy in both temporomandibular joints with erosion of the each mandibular condyle. Orbits: There is soft tissue swelling in the preseptal right orbital region. There is no intraorbital lesion beyond a small amount of air that has tracked from the right maxillary antrum into the inferior right orbit. There is no hemorrhage within either orbit. Sinuses: There is complete opacification of the right maxillary antrum, likely hemorrhage. Hemorrhage tracks into the right near ease, likely through a fracture of the medial maxillary sinus wall on the right. There is  obstruction of the ostiomeatal unit complex on the right. There is opacification in several ethmoid air cells bilaterally, more on the right than on the left. There is an air-fluid level in a posterior right ethmoid air cell. Frontal sinuses are clear. There is hemorrhage in the right sphenoid sinus complex with air-fluid level. No appreciable mucosal thickening seen in this area. Soft tissues: There is marked soft tissue swelling over the entire right face with areas of developing hematoma throughout the right facial region. Air tracks into the soft tissues on the right via the complex maxillary sinus  region fractures. More inferiorly, soft tissues appear normal. No abscess. Salivary glands appear normal. Visualize pharynx appears unremarkable. No tongue or tongue base lesions evident. CT CERVICAL SPINE FINDINGS Alignment: There is levoscoliosis. There is 2 mm of anterolisthesis of C4 on C5. There is 2 mm of C5 on C6. There is 3 mm of anterolisthesis of C6 on C7. There is 2 mm of anterolisthesis of T1 on T2. Skull base and vertebrae: The skull base and craniocervical junction regions appear normal. There is no appreciable fracture. There are no blastic or lytic bone lesions. There is osteoarthritic change with cystic lesions in the odontoid. No impending fracture from this cystic change evident. Soft tissues and spinal canal: Prevertebral soft tissues and predental space regions are normal. No paraspinous lesions. No cord or canal hematoma is demonstrable on this study. No spinal stenosis evident. Disc levels: There is moderately severe disc space narrowing at C3-4, C5-6, C6-7, C7-T1. There is moderate disc space narrowing at C4-5. There is facet hypertrophy at multiple levels. No disc extrusion. Upper chest: Visualized lung apices are clear. Other: There is atherosclerotic calcification in each carotid artery. IMPRESSION: CT head: Chronic atrophy with supratentorial small vessel disease, stable. No intracranial mass, hemorrhage, or extra-axial fluid collection. No acute infarct evident. No calvarial fracture. CT maxillofacial: There are fractures of the right orbital wall and right orbital floor. There are fractures of the right anterior, medial, lateral, and posterior maxillary antral walls with displaced fracture fragments in all of these regions. The pterygoid plate on the right remains intact. Posterior maxillary antral wall fracture is superior to the pterygoid plate. There is a mildly displaced fracture of the midportion of the zygomatic arch. The posterior aspect of the right zygomatic arch is displaced  mildly laterally compared to the anterior portion. No fractures to the left of midline apparent. Extensive right-sided paranasal sinus disease is noted, most pronounced in the right maxillary antral region. Air tracks from the right maxillary antrum throughout the surrounding face and inferior orbital regions. 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 CT cervical spine: Scoliosis with mild spondylolisthesis at several levels, felt to be due to underlying spondylosis. No fracture. Multilevel osteoarthritic change. Bilateral carotid artery calcification noted. Electronically Signed   By: Bretta Bang III M.D.   On: 11/13/2016 08:38   Ct Chest Wo Contrast  Result Date: 11/13/2016 CLINICAL DATA:  Found on floor. Complains of right facial pain with bruising and swelling. Left arm pain. EXAM: CT CHEST, ABDOMEN AND PELVIS WITHOUT CONTRAST TECHNIQUE: Multidetector CT imaging of the chest, abdomen and pelvis was performed following the standard protocol without IV contrast. COMPARISON:  Chest radiograph 11/13/2016. CT pelvis 02/16/2016. Lumbar spine 02/12/2016 FINDINGS: CT CHEST FINDINGS Cardiovascular: Calcifications of the aortic root. Calcifications at the aortic arch. Caliber of the thoracic aorta is within normal limits. Mediastinum/Nodes: No significant chest lymphadenopathy. There is minimal pericardial fluid. Lungs/Pleura: Trachea and mainstem  bronchi are patent. 1.1 x 0.5 cm nodule structure in the right upper lobe on sequence 3, image 49. Small calcified granuloma in the right upper lobe on image 43. Mild scarring at the lung apices. No large areas of airspace disease or lung consolidation. Negative for pneumothorax. Musculoskeletal: Significant degenerative changes and joint space narrowing in the right shoulder joint. There is a large bursal fluid collection around the right shoulder. Displaced and comminuted fracture of the proximal left humerus. Evidence for prior  left mastectomy. CT ABDOMEN PELVIS FINDINGS Hepatobiliary: Gallbladder is mildly distended with evidence of stones or sludge. No gross abnormality to the liver on this noncontrast examination. Pancreas: Limited evaluation of pancreas without gross abnormality. Spleen: Normal appearance of spleen without enlargement. Adrenals/Urinary Tract: Adrenal glands not well visualized. There is quite a bit of motion artifact on this examination. There are least 2 small hyperdense structures involving the left kidney, largest measuring 0.8 cm in the anterior left interpolar region. Negative for hydronephrosis. Stomach/Bowel: The rectum is distended with a large amount stool. Normal caliber of the small bowel and stomach. No evidence for bowel obstruction. Vascular/Lymphatic: Atherosclerotic calcifications of the abdominal aorta without aneurysm. No significant lymph node enlargement in the abdomen or pelvis. Reproductive: Status post hysterectomy. No adnexal masses. Other: No free fluid.  No free air. Musculoskeletal: Surgical fixation in the right hip. Old fracture involving the left inferior pubic ramus. There is a severe compression fracture of the L2 vertebral body with approximately 6 mm of bone retropulsion. This fracture is new from an exam on 02/12/2016. There is approximately 60-70% loss of vertebral body height at L2. Mild disc space narrowing at L4-L5. IMPRESSION: Comminuted and displaced fracture involving the proximal left humerus. Compression fracture of L2 with bone retropulsion. This is new from the previous examinations and this fracture is age indeterminate. No acute intrathoracic or intra-abdominal abnormalities. Limited evaluation of the intra-abdominal structures due to motion and the lack of intravenous contrast. Indeterminate 1.1 cm nodular lesion in the right upper lung. Neoplastic process cannot be excluded. This nodule could be further evaluated with a PET-CT if a neoplastic workup is desired.  Hyperdense structures in left kidney. These could represent hemorrhagic or proteinaceous cyst but indeterminate. Left amount of fluid surrounding the right shoulder probably related to a bursal fluid collection. Electronically Signed   By: Richarda Overlie M.D.   On: 11/13/2016 09:08   Ct Cervical Spine Wo Contrast  Result Date: 11/13/2016 CLINICAL DATA:  Pain following fall EXAM: CT HEAD WITHOUT CONTRAST CT MAXILLOFACIAL WITHOUT CONTRAST CT CERVICAL SPINE WITHOUT CONTRAST TECHNIQUE: Multidetector CT imaging of the head, cervical spine, and maxillofacial structures were performed using the standard protocol without intravenous contrast. Multiplanar CT image reconstructions of the cervical spine and maxillofacial structures were also generated. COMPARISON:  Head CT and cervical spine CT February 16, 2016 FINDINGS: CT HEAD FINDINGS Brain: There is moderate diffuse atrophy. There is no intracranial mass, hemorrhage, extra-axial fluid collection, or midline shift. There is patchy small vessel disease in the centra semiovale bilaterally. There is mild small vessel disease in each thalamus. There is no new gray-white compartment lesion. No acute infarct evident. Vascular: No hyperdense vessel. There is calcification in both carotid siphon regions. Skull: Bony calvarium appears intact. Other: Mastoid air cells are clear. CT MAXILLOFACIAL FINDINGS Osseous: There is a comminuted fracture of the lateral orbital wall on the right. There is a comminuted orbital floor fracture on the right with depressed fracture fragments. There does not appear to be  extraocular muscle entrapment from this depressed orbital floor fracture. There is a comminuted anterior maxillary wall fracture on the right with fracture fragments depressed posteriorly into the maxillary antral region. There is a displaced fracture along the medial maxillary antrum on the right with a posterior fragment lateral to the anterior fragment. The pterygoid plate on the  right remains intact. However, superior to the pterygoid plate on the right, there is a mildly displaced fracture of the posterior wall of the maxillary antrum on the right. There is a fracture through the midportion of the zygomatic arch on the right. No fractures to the left of midline are demonstrable. There is advanced arthropathy in both temporomandibular joints with erosion of the each mandibular condyle. Orbits: There is soft tissue swelling in the preseptal right orbital region. There is no intraorbital lesion beyond a small amount of air that has tracked from the right maxillary antrum into the inferior right orbit. There is no hemorrhage within either orbit. Sinuses: There is complete opacification of the right maxillary antrum, likely hemorrhage. Hemorrhage tracks into the right near ease, likely through a fracture of the medial maxillary sinus wall on the right. There is obstruction of the ostiomeatal unit complex on the right. There is opacification in several ethmoid air cells bilaterally, more on the right than on the left. There is an air-fluid level in a posterior right ethmoid air cell. Frontal sinuses are clear. There is hemorrhage in the right sphenoid sinus complex with air-fluid level. No appreciable mucosal thickening seen in this area. Soft tissues: There is marked soft tissue swelling over the entire right face with areas of developing hematoma throughout the right facial region. Air tracks into the soft tissues on the right via the complex maxillary sinus region fractures. More inferiorly, soft tissues appear normal. No abscess. Salivary glands appear normal. Visualize pharynx appears unremarkable. No tongue or tongue base lesions evident. CT CERVICAL SPINE FINDINGS Alignment: There is levoscoliosis. There is 2 mm of anterolisthesis of C4 on C5. There is 2 mm of C5 on C6. There is 3 mm of anterolisthesis of C6 on C7. There is 2 mm of anterolisthesis of T1 on T2. Skull base and vertebrae:  The skull base and craniocervical junction regions appear normal. There is no appreciable fracture. There are no blastic or lytic bone lesions. There is osteoarthritic change with cystic lesions in the odontoid. No impending fracture from this cystic change evident. Soft tissues and spinal canal: Prevertebral soft tissues and predental space regions are normal. No paraspinous lesions. No cord or canal hematoma is demonstrable on this study. No spinal stenosis evident. Disc levels: There is moderately severe disc space narrowing at C3-4, C5-6, C6-7, C7-T1. There is moderate disc space narrowing at C4-5. There is facet hypertrophy at multiple levels. No disc extrusion. Upper chest: Visualized lung apices are clear. Other: There is atherosclerotic calcification in each carotid artery. IMPRESSION: CT head: Chronic atrophy with supratentorial small vessel disease, stable. No intracranial mass, hemorrhage, or extra-axial fluid collection. No acute infarct evident. No calvarial fracture. CT maxillofacial: There are fractures of the right orbital wall and right orbital floor. There are fractures of the right anterior, medial, lateral, and posterior maxillary antral walls with displaced fracture fragments in all of these regions. The pterygoid plate on the right remains intact. Posterior maxillary antral wall fracture is superior to the pterygoid plate. There is a mildly displaced fracture of the midportion of the zygomatic arch. The posterior aspect of the right zygomatic arch is  displaced mildly laterally compared to the anterior portion. No fractures to the left of midline apparent. Extensive right-sided paranasal sinus disease is noted, most pronounced in the right maxillary antral region. Air tracks from the right maxillary antrum throughout the surrounding face and inferior orbital regions. 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 CT cervical spine: Scoliosis with mild  spondylolisthesis at several levels, felt to be due to underlying spondylosis. No fracture. Multilevel osteoarthritic change. Bilateral carotid artery calcification noted. Electronically Signed   By: Bretta Bang III M.D.   On: 11/13/2016 08:38   Dg Chest Port 1 View  Result Date: 11/17/2016 CLINICAL DATA:  Fever.  Hypertension. EXAM: PORTABLE CHEST 1 VIEW COMPARISON:  One-view chest x-ray 11/15/2016 FINDINGS: Heart size is normal. Aeration at the left base is improved. Asymmetric right-sided density remains. Underlying chronic interstitial coarsening is present. Degenerative changes are again seen in the shoulders. IMPRESSION: 1. Improving aeration at the left base. 2. Persistent asymmetric right-sided airspace disease. Infection is not excluded. Electronically Signed   By: Marin Roberts M.D.   On: 11/17/2016 16:03   Dg Chest Port 1 View  Result Date: 11/15/2016 CLINICAL DATA:  Acute respiratory failure. Short of breath and pneumonia. Fever EXAM: PORTABLE CHEST 1 VIEW COMPARISON:  11/13/2016 FINDINGS: Heart size upper normal. Negative for heart failure. Mild bibasilar airspace disease and small pleural effusions have developed since the prior study. Mild vascular congestion has developed since the prior study. Displaced fracture proximal left humerus unchanged. Severe degenerative change in the shoulder joint bilaterally. IMPRESSION: Pulmonary vascular congestion with bilateral effusions and bibasilar airspace disease most compatible with congestive heart failure. Left humeral fracture Electronically Signed   By: Marlan Palau M.D.   On: 11/15/2016 11:19   Dg Shoulder Left  Result Date: 11/13/2016 CLINICAL DATA:  Pain following fall EXAM: LEFT SHOULDER - 2+ VIEW COMPARISON:  None. FINDINGS: Frontal and Y scapular images were obtained. There is a fracture of the proximal left humerus extending from the head region into the proximal diaphysis laterally with separation of fracture fragments.  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 There is no frank dislocation, although extensive remodeling in the left shoulder joint is noted. Bones are diffusely osteoporotic. There is separation of the acromioclavicular joint. Visualized left lung is clear. There is aortic atherosclerosis. IMPRESSION: Fracture proximal humerus with displacement of fracture fragments. No frank dislocation. 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 Acromioclavicular separation, likely chronic. Bones diffusely osteoporotic. There is aortic atherosclerosis. Electronically Signed   By: Bretta Bang III M.D.   On: 11/13/2016 08:09   Dg Hand Complete Right  Result Date: 11/13/2016 CLINICAL DATA:  Pain following fall EXAM: RIGHT HAND - COMPLETE 3+ VIEW COMPARISON:  None. FINDINGS: Frontal, oblique, and lateral views obtained. There is an impacted fracture of the distal radial metaphysis. There is a nondisplaced fracture of the proximal portion of the fourth proximal phalanx. There is evidence of an old fracture of the scaphoid bone with scaphotrapezial joint dislocation, felt to be chronic. There is age uncertain dislocation at the second and third MCP joints. 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 There is erosive osteoarthritis in the fourth PIP joint. There is osteoarthritis in all MCP, PIP, and DIP joints. There is advanced osteoporosis. There is extensive chondrocalcinosis in the wrist region proximally. IMPRESSION: Impacted fracture distal radial metaphysis. Nondisplaced fracture proximal aspect fourth proximal phalanx. Old appearing fracture distal scaphoid  with chronic appearing scaphotrapezial joint dislocation. 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 Age uncertain dislocation is noted in the second and third metacarpals. Multilevel osteoarthritis with erosive osteoarthritis in the fourth PIP joint. Advanced osteoporosis.  Chondrocalcinosis in the proximal radial region may be due either to osteoarthritis or calcium pyrophosphate deposition disease. Electronically Signed   By: Bretta Bang III M.D.   On: 11/13/2016 08:16   Ct Maxillofacial Wo Contrast  Result Date: 11/13/2016 CLINICAL DATA:  Pain following fall EXAM: CT HEAD WITHOUT CONTRAST CT MAXILLOFACIAL WITHOUT CONTRAST CT CERVICAL SPINE WITHOUT CONTRAST TECHNIQUE: Multidetector CT imaging of the head, cervical spine, and maxillofacial structures were performed using the standard protocol without intravenous contrast. Multiplanar CT image reconstructions of the cervical spine and maxillofacial structures were also generated. COMPARISON:  Head CT and cervical spine CT February 16, 2016 FINDINGS: CT HEAD FINDINGS Brain: There is moderate diffuse atrophy. There is no intracranial mass, hemorrhage, extra-axial fluid collection, or midline shift. There is patchy small vessel disease in the centra semiovale bilaterally. There is mild small vessel disease in each thalamus. There is no new gray-white compartment lesion. No acute infarct evident. Vascular: No hyperdense vessel. There is calcification in both carotid siphon regions. Skull: Bony calvarium appears intact. Other: Mastoid air cells are clear. CT MAXILLOFACIAL FINDINGS Osseous: There is a comminuted fracture of the lateral orbital wall on the right. There is a comminuted orbital floor fracture on the right with depressed fracture fragments. There does not appear to be extraocular muscle entrapment from this depressed orbital floor fracture. There is a comminuted anterior maxillary wall fracture on the right with fracture fragments depressed posteriorly into the maxillary antral region. There is a displaced fracture along the medial maxillary antrum on the right with a posterior fragment lateral to the anterior fragment. The pterygoid plate on the right remains intact. However, superior to the pterygoid plate on the right,  there is a mildly displaced fracture of the posterior wall of the maxillary antrum on the right. There is a fracture through the midportion of the zygomatic arch on the right. No fractures to the left of midline are demonstrable. There is advanced arthropathy in both temporomandibular joints with erosion of the each mandibular condyle. Orbits: There is soft tissue swelling in the preseptal right orbital region. There is no intraorbital lesion beyond a small amount of air that has tracked from the right maxillary antrum into the inferior right orbit. There is no hemorrhage within either orbit. Sinuses: There is complete opacification of the right maxillary antrum, likely hemorrhage. Hemorrhage tracks into the right near ease, likely through a fracture of the medial maxillary sinus wall on the right. There is obstruction of the ostiomeatal unit complex on the right. There is opacification in several ethmoid air cells bilaterally, more on the right than on the left. There is an air-fluid level in a posterior right ethmoid air cell. Frontal sinuses are clear. There is hemorrhage in the right sphenoid sinus complex with air-fluid level. No appreciable mucosal thickening seen in this area. Soft tissues: There is marked soft tissue swelling over the entire right face with areas of developing hematoma throughout the right facial region. Air tracks into the soft tissues on the right via the complex maxillary sinus region fractures. More inferiorly, soft tissues appear normal. No abscess. Salivary glands appear normal. Visualize pharynx appears unremarkable. No tongue or tongue base lesions evident. CT CERVICAL SPINE FINDINGS Alignment: There is levoscoliosis. There is 2 mm of anterolisthesis of C4 on C5. There  is 2 mm of C5 on C6. There is 3 mm of anterolisthesis of C6 on C7. There is 2 mm of anterolisthesis of T1 on T2. Skull base and vertebrae: The skull base and craniocervical junction regions appear normal. There is no  appreciable fracture. There are no blastic or lytic bone lesions. There is osteoarthritic change with cystic lesions in the odontoid. No impending fracture from this cystic change evident. Soft tissues and spinal canal: Prevertebral soft tissues and predental space regions are normal. No paraspinous lesions. No cord or canal hematoma is demonstrable on this study. No spinal stenosis evident. Disc levels: There is moderately severe disc space narrowing at C3-4, C5-6, C6-7, C7-T1. There is moderate disc space narrowing at C4-5. There is facet hypertrophy at multiple levels. No disc extrusion. Upper chest: Visualized lung apices are clear. Other: There is atherosclerotic calcification in each carotid artery. IMPRESSION: CT head: Chronic atrophy with supratentorial small vessel disease, stable. No intracranial mass, hemorrhage, or extra-axial fluid collection. No acute infarct evident. No calvarial fracture. CT maxillofacial: There are fractures of the right orbital wall and right orbital floor. There are fractures of the right anterior, medial, lateral, and posterior maxillary antral walls with displaced fracture fragments in all of these regions. The pterygoid plate on the right remains intact. Posterior maxillary antral wall fracture is superior to the pterygoid plate. There is a mildly displaced fracture of the midportion of the zygomatic arch. The posterior aspect of the right zygomatic arch is displaced mildly laterally compared to the anterior portion. No fractures to the left of midline apparent. Extensive right-sided paranasal sinus disease is noted, most pronounced in the right maxillary antral region. Air tracks from the right maxillary antrum throughout the surrounding face and inferior orbital regions. 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 CT cervical spine: Scoliosis with mild spondylolisthesis at several levels, felt to be due to underlying spondylosis. No  fracture. Multilevel osteoarthritic change. Bilateral carotid artery calcification noted. Electronically Signed   By: Bretta Bang III M.D.   On: 11/13/2016 08:38       Subjective: No new complaints, pt confused.   Discharge Exam: Vitals:   11/21/16 1408 11/21/16 2205  BP: (!) 162/79 (!) 142/68  Pulse: 60 63  Resp: 16 15  Temp: 98.6 F (37 C) 98.2 F (36.8 C)   Vitals:   11/20/16 2153 11/21/16 0652 11/21/16 1408 11/21/16 2205  BP: (!) 149/77 (!) 141/92 (!) 162/79 (!) 142/68  Pulse: 74 (!) 59 60 63  Resp: Temp: 98.3 F (36.8 C) 98.3 F (36.8 C) 98.6 F (37 C) 98.2 F (36.8 C)  TempSrc: Oral Axillary Axillary Oral  SpO2: 92% 93% 91% 99%  Weight:      Height:       Pt sleeping comfortably.  Lungs diminished at bases abd soft non tender  Skin, multiple ecchymosis on the face and arm    The results of significant diagnostics from this hospitalization (including imaging, microbiology, ancillary and laboratory) are listed below for reference.     Microbiology: Recent Results (from the past 240 hour(s))  MRSA PCR Screening     Status: None   Collection Time: 11/13/16 11:58 AM  Result Value Ref Range Status   MRSA by PCR NEGATIVE NEGATIVE Final    Comment:        The GeneXpert MRSA Assay (FDA approved for NASAL specimens only), is one component of a comprehensive MRSA colonization surveillance program. It is not  intended to diagnose MRSA infection nor to guide or monitor treatment for MRSA infections.      Labs: BNP (last 3 results) No results for input(s): BNP in the last 8760 hours. Basic Metabolic Panel:  Recent Labs Lab 11/16/16 0612  NA 138  K 3.9  CL 106  CO2 25  GLUCOSE 110*  BUN 32*  CREATININE 1.19*  CALCIUM 8.5*   Liver Function Tests: No results for input(s): AST, ALT, ALKPHOS, BILITOT, PROT, ALBUMIN in the last 168 hours. No results for input(s): LIPASE, AMYLASE in the last 168 hours. No results for input(s):  AMMONIA in the last 168 hours. CBC:  Recent Labs Lab 11/16/16 0612  WBC 15.1*  HGB 8.7*  HCT 25.8*  MCV 97.0  PLT 180   Cardiac Enzymes: No results for input(s): CKTOTAL, CKMB, CKMBINDEX, TROPONINI in the last 168 hours. BNP: Invalid input(s): POCBNP CBG: No results for input(s): GLUCAP in the last 168 hours. D-Dimer No results for input(s): DDIMER in the last 72 hours. Hgb A1c No results for input(s): HGBA1C in the last 72 hours. Lipid Profile No results for input(s): CHOL, HDL, LDLCALC, TRIG, CHOLHDL, LDLDIRECT in the last 72 hours. Thyroid function studies No results for input(s): TSH, T4TOTAL, T3FREE, THYROIDAB in the last 72 hours.  Invalid input(s): FREET3 Anemia work up No results for input(s): VITAMINB12, FOLATE, FERRITIN, TIBC, IRON, RETICCTPCT in the last 72 hours. Urinalysis    Component Value Date/Time   COLORURINE YELLOW 11/17/2016 1355   APPEARANCEUR CLEAR 11/17/2016 1355   LABSPEC 1.025 11/17/2016 1355   PHURINE 5.0 11/17/2016 1355   GLUCOSEU NEGATIVE 11/17/2016 1355   HGBUR NEGATIVE 11/17/2016 1355   BILIRUBINUR NEGATIVE 11/17/2016 1355   KETONESUR NEGATIVE 11/17/2016 1355   PROTEINUR 30 (A) 11/17/2016 1355   UROBILINOGEN 0.2 06/21/2015 0109   NITRITE NEGATIVE 11/17/2016 1355   LEUKOCYTESUR NEGATIVE 11/17/2016 1355   Sepsis Labs Invalid input(s): PROCALCITONIN,  WBC,  LACTICIDVEN Microbiology Recent Results (from the past 240 hour(s))  MRSA PCR Screening     Status: None   Collection Time: 11/13/16 11:58 AM  Result Value Ref Range Status   MRSA by PCR NEGATIVE NEGATIVE Final    Comment:        The GeneXpert MRSA Assay (FDA approved for NASAL specimens only), is one component of a comprehensive MRSA colonization surveillance program. It is not intended to diagnose MRSA infection nor to guide or monitor treatment for MRSA infections.      Time coordinating discharge: Over 30 minutes  SIGNED:   [name], MD  [facility] 11/22/2016, 10:18 AM Pager   If 7PM-7AM, please contact night-coverage [email or web address] Password [email or web address]

## 2016-11-22 NOTE — Progress Notes (Signed)
Daily Progress Note   Patient Name: [name]       Date: 11/22/2016 DOB: 1919/08/30  Age: 83 y.o. MRN#: [id number] Attending Physician: [name], MD Primary Care Physician: [name], MD Admit Date: 11/13/2016  Reason for Consultation/Follow-up: Establishing goals of care, Inpatient hospice referral and Psychosocial/spiritual support  Subjective: [name] is resting quietly in bed. There is no family at bedside at this time, and she wakes when I call her name and touch her.  She shares that if she is thirsty, and is able to take several spoons full of thickened liquid without overt signs and symptoms of aspiration. I explain to [name] that she has fallen and broken a few bones. She tells me that she does not have pain at this time. [name] has been accepted to hospice home of [facility], and will transfer some time this morning. Family is aware, no questions or concerns at this time.  Length of Stay: 9  Current Medications: Scheduled Meds:  . levothyroxine  125 mcg Oral QAC breakfast  . loperamide  2 mg Oral BID  . losartan  50 mg Oral Daily  . mouth rinse  15 mL Mouth Rinse BID    Continuous Infusions:   PRN Meds: acetaminophen **OR** acetaminophen, magnesium hydroxide, morphine CONCENTRATE, ondansetron **OR** ondansetron (ZOFRAN) IV  Physical Exam  Constitutional: No distress.  Multiple facial bruising, lying quietly in bed with eyes closed,  HENT:  Head: Normocephalic.  Cardiovascular: Normal rate.   Pulmonary/Chest: Effort normal. No respiratory distress.  Abdominal: Soft. She exhibits no distension.  Musculoskeletal: She exhibits no edema.  Multiple fractures including facial fractures, left humorous, right wrist  Neurological:  Opens  eyes when spoken to, able to make basic needs known  Skin: Skin is warm and dry.  Facial bruising  Nursing note and vitals reviewed.           Vital Signs: BP (!) 142/68 (BP Location: Right Leg)   Pulse 63   Temp 98.2 F (36.8 C) (Oral)   Resp 15   Ht  (1.575 m)   Wt 46.4 kg (102 lb 4.8 oz)   SpO2 96%   BMI 18.71 kg/m  SpO2: SpO2: 96 % O2 Device: O2 Device: Nasal Cannula O2 Flow Rate: O2 Flow Rate (L/min): 2 L/min  Intake/output summary:  Intake/Output Summary (Last 24 hours) at 11/22/16 1212 Last data filed at 11/22/16 0945  Gross per 24 hour  Intake                0 ml  Output                0 ml  Net                0 ml   LBM: Last BM Date:  (unsure) Baseline Weight: Weight: 46.3 kg (102 lb) Most recent weight: Weight: 46.4 kg (102 lb 4.8 oz)       Palliative Assessment/Data:    Flowsheet Rows     Most Recent Value  Intake Tab  Referral Department  Hospitalist  Unit at Time of Referral  Med/Surg Unit  Palliative Care Primary Diagnosis  Trauma  Date Notified  11/18/16  Palliative Care Type  New Palliative care  Reason for referral  Clarify Goals of Care  Date of Admission  11/13/16  Date first seen by Palliative Care  11/18/16  # of days Palliative referral response time  0 Day(s)  # of days IP prior to Palliative referral  5  Clinical Assessment  Palliative Performance Scale Score  30%  Pain Max last 24 hours  Not able to report  Pain Min Last 24 hours  Not able to report  Dyspnea Max Last 24 Hours  Not able to report  Dyspnea Min Last 24 hours  Not able to report  Psychosocial & Spiritual Assessment  Palliative Care Outcomes  Patient/Family meeting held?  Yes  Who was at the meeting?  DIL at bedside  Palliative Care Outcomes  Provided psychosocial or spiritual support, Clarified goals of care  Patient/Family wishes: Interventions discontinued/not started   Mechanical Ventilation      Patient Active Problem List   Diagnosis Date Noted  .  Encounter for hospice care discussion   . Palliative care encounter   . Goals of care, counseling/discussion   . Laceration of right ring finger without foreign body without damage to nail   . Closed nondisplaced fracture of proximal phalanx of right ring finger   . AKI (acute kidney injury) (HCC) 11/13/2016  . Fall 11/13/2016  . Right orbital fracture (HCC) 11/13/2016  . Right maxillary fracture (HCC) 11/13/2016  . Closed fracture of left proximal humerus 11/13/2016  . Right radial fracture 11/13/2016  . Delirium 02/19/2016  . Fracture of multiple pubic rami (HCC) 02/16/2016  . PUD (peptic ulcer disease) 02/16/2016  . Hyponatremia 02/16/2016  . Infection of urinary tract 02/16/2016  . Acute respiratory failure with hypoxia (HCC) 06/07/2015  . GERD (gastroesophageal reflux disease) 06/07/2015  . CKD (chronic kidney disease) stage 3, GFR 30-59 ml/min 06/07/2015  . HCAP (healthcare-associated pneumonia) 06/06/2015  . Fever 05/21/2012  . Encephalopathy acute 05/21/2012  . Dementia 05/21/2012  . History of fracture of right hip 05/21/2012  . History of subdural hematoma (post traumatic) 05/21/2012  . Hemiparesis (HCC) 05/15/2012  . Hypoxemia 05/15/2012  . Hypothyroidism 05/14/2012  . Anemia 05/14/2012  . GASTRIC ULCER 09/23/2009  . GI BLEEDING 08/20/2009  . DIARRHEA, ACUTE 08/18/2009  . Peptic ulcer 07/31/2009  . HEMATEMESIS 07/31/2009  . Essential hypertension 07/30/2009  . DIVERTICULOSIS OF COLON 07/30/2009  . ARTHRITIS, BACK 07/30/2009  . OSTEOPOROSIS 07/30/2009  . DYSPHAGIA UNSPECIFIED 07/30/2009    Palliative Care Assessment & Plan   Patient Profile: 83 y.o.femalewith past medical history of kidney disease stage III, mild  to moderate dementia, E. coli UTI, healthcare associated pneumonia, right hip fracture in 2013, hypertension, macular degeneration, history of G.I. bleed, [facility] since 2010admitted on 3/25/2018with fall with multiple fractures.    Assessment: Multiple fractures status post fall; not a surgical candidate at this time. Family has decided to focus on comfort and dignity due to continued declines. Frailty; known mild to moderate dementia, functional decline with falls, not a candidate to return to ALF. Transition to comfort measures only, hospice home of [facility].  Recommendations/Plan:  transition to comfort measures only, hospice home of [facility]  Goals of Care and Additional Recommendations:  Limitations on Scope of Treatment: Full Comfort Care  Code Status:    Code Status Orders        Start     Ordered   11/13/16 1720  Do not attempt resuscitation (DNR)  Continuous    Question Answer Comment  In the event of cardiac or respiratory ARREST Do not call a "code blue"   In the event of cardiac or respiratory ARREST Do not perform Intubation, CPR, defibrillation or ACLS   In the event of cardiac or respiratory ARREST Use medication by any route, position, wound care, and other measures to relive pain and suffering. May use oxygen, suction and manual treatment of airway obstruction as needed for comfort.      11/13/16 1738    Code Status History    Date Active Date Inactive Code Status Order ID Comments User Context   02/17/2016  4:43 PM 02/19/2016  7:11 PM DNR [id number]  Henderson Cloud, MD Inpatient   02/16/2016  5:48 PM 02/17/2016  4:43 PM Full Code [id number]  Elliot Cousin, MD Inpatient   06/06/2015 11:55 PM 06/08/2015  7:47 PM DNR [id number]  Houston Siren, MD Inpatient   05/21/2012  7:56 PM 05/24/2012  6:02 PM DNR [id number]  Osvaldo Shipper, MD Inpatient    Advance Directive Documentation     Most Recent Value  Type of Advance Directive  Out of facility DNR (pink MOST or yellow form)  Pre-existing out of facility DNR order (yellow form or pink MOST form)  Yellow form placed in chart (order not valid for inpatient use)  "MOST" Form in Place?  -       Prognosis:   < 4 weeks or less  is expected based on multiple fractures S/P fall, frailty, pneumonia, dysphagia with restricted diet.  Discharge Planning:  Hospice home of [facility] to focus on comfort and dignity at end-of-life.  Care plan was discussed with nursing staff, case manager, social worker, and Dr. Blake Divine.  Thank you for allowing the Palliative Medicine Team to assist in the care of this patient.   Time In: 1025 Time Out: 1045 Total Time 20 minutes Prolonged Time Billed  no       Greater than 50%  of this time was spent counseling and coordinating care related to the above assessment and plan.  Katheran Awe, NP  Please contact Palliative Medicine Team phone at [phone] for questions and concerns.

## 2016-11-22 NOTE — Care Management Note (Signed)
Case Management Note  Patient Details  Name: [name] MRN: [id number] Date of Birth: 07-02-1920  Expected Discharge Date:  11/22/16               Expected Discharge Plan:  Hospice Medical Facility  In-House Referral:  Clinical Social Work  Discharge planning Services  CM Consult  Post Acute Care Choice:  NA Choice offered to:  NA  Status of Service:  Completed, signed off  If discussed at Long Length of Stay Meetings, dates discussed:  11/22/2016  Additional Comments: DC to hospice medical facility. CSW has made arrangements.   Malcolm Metro, RN 11/22/2016, 10:31 AM

## 2016-11-22 NOTE — Progress Notes (Addendum)
Patient has been accepted at [facility]. Discussed with Arline Asp and wanting patient to arrive at [facility] at 11.  MD has been paged to dc patient and completed discharge summary. Son has been in contact with hospice home and arranging to be at home at 11:30am.  Call placed to [facility] to update regarding disposition and hospice home. Discussed case with Lyla Son and she will follow up with family and investigation.  No other needs. DC to hospice home.  Deretha Emory, MSW Clinical Social Work: Optician, dispensing Coverage for :  [phone]

## 2016-11-22 NOTE — Progress Notes (Signed)
Hedda Slade discharged to [facility] per MD order.  Discharge instructions sent with patient. Report to Kim  Allergies as of 11/22/2016      Reactions   Amoxicillin-pot Clavulanate    REACTION: UNKNOWN REACTION   Cephalexin    REACTION: UNKNOWN REACTION   Nsaids    UNKNOWN REACTION      Medication List    STOP taking these medications   DAILY VITE Tabs   loratadine 10 MG tablet Commonly known as:  CLARITIN   Lutein 6 MG Caps   sulfamethoxazole-trimethoprim 800-160 MG tablet Commonly known as:  BACTRIM DS,SEPTRA DS     TAKE these medications   acetaminophen 500 MG tablet Commonly known as:  TYLENOL Take 500 mg by mouth at bedtime. For pain management   BAZA ANTIFUNGAL 2 % cream Generic drug:  miconazole Apply 1 application topically 2 (two) times daily. To buttocks for redness   donepezil 10 MG tablet Commonly known as:  ARICEPT Take 10 mg by mouth daily.   escitalopram 10 MG tablet Commonly known as:  LEXAPRO Take 10 mg by mouth daily.   levothyroxine 125 MCG tablet Commonly known as:  SYNTHROID, LEVOTHROID Take 125 mcg by mouth daily before breakfast.   loperamide 2 MG tablet Commonly known as:  IMODIUM A-D Take 2 mg by mouth 2 (two) times daily.   losartan 50 MG tablet Commonly known as:  COZAAR Take 50 mg by mouth daily.   memantine 10 MG tablet Commonly known as:  NAMENDA Take 10 mg by mouth 2 (two) times daily.   morphine CONCENTRATE 10 MG/0.5ML Soln concentrated solution Take 0.13 mLs (2.6 mg total) by mouth every 4 (four) hours as needed for moderate pain or severe pain.   SYSTANE OP Apply 1 drop to eye 4 (four) times daily.       IV site discontinued and catheter remains intact. Site without signs and symptoms of complications. Dressing and pressure applied.  Pt transferred via [facility].  Rica Koyanagi Jaclynne Baldo 11/22/2016 4:31 PM

## 2016-11-22 NOTE — Care Management Important Message (Signed)
Important Message  Patient Details  Name: [name] MRN: [id number] Date of Birth: 29-Mar-1920   Medicare Important Message Given:  Yes    Malcolm Metro, RN 11/22/2016, 10:28 AM

## 2016-11-22 NOTE — Progress Notes (Signed)
LCSW following for disposition of needs:  Comfort care: [facility]  Referral and call has been made to [place] with admissions at [facility].  Will follow up once referral has been completed.  Deretha Emory, MSW Clinical Social Work: Optician, dispensing Coverage for :  [phone]

## 2016-12-02 ENCOUNTER — Ambulatory Visit: Payer: Medicare Other | Admitting: Orthopedic Surgery

## 2016-12-20 DEATH — deceased

## 2017-02-24 IMAGING — DX DG CHEST 2V
2 series · 2 of 2 positions shown · non-contrast
Comparison: 09/17/2014

CLINICAL DATA: Weakness, confusion, and vertigo.

EXAM:
CHEST  2 VIEW

[chest lat]
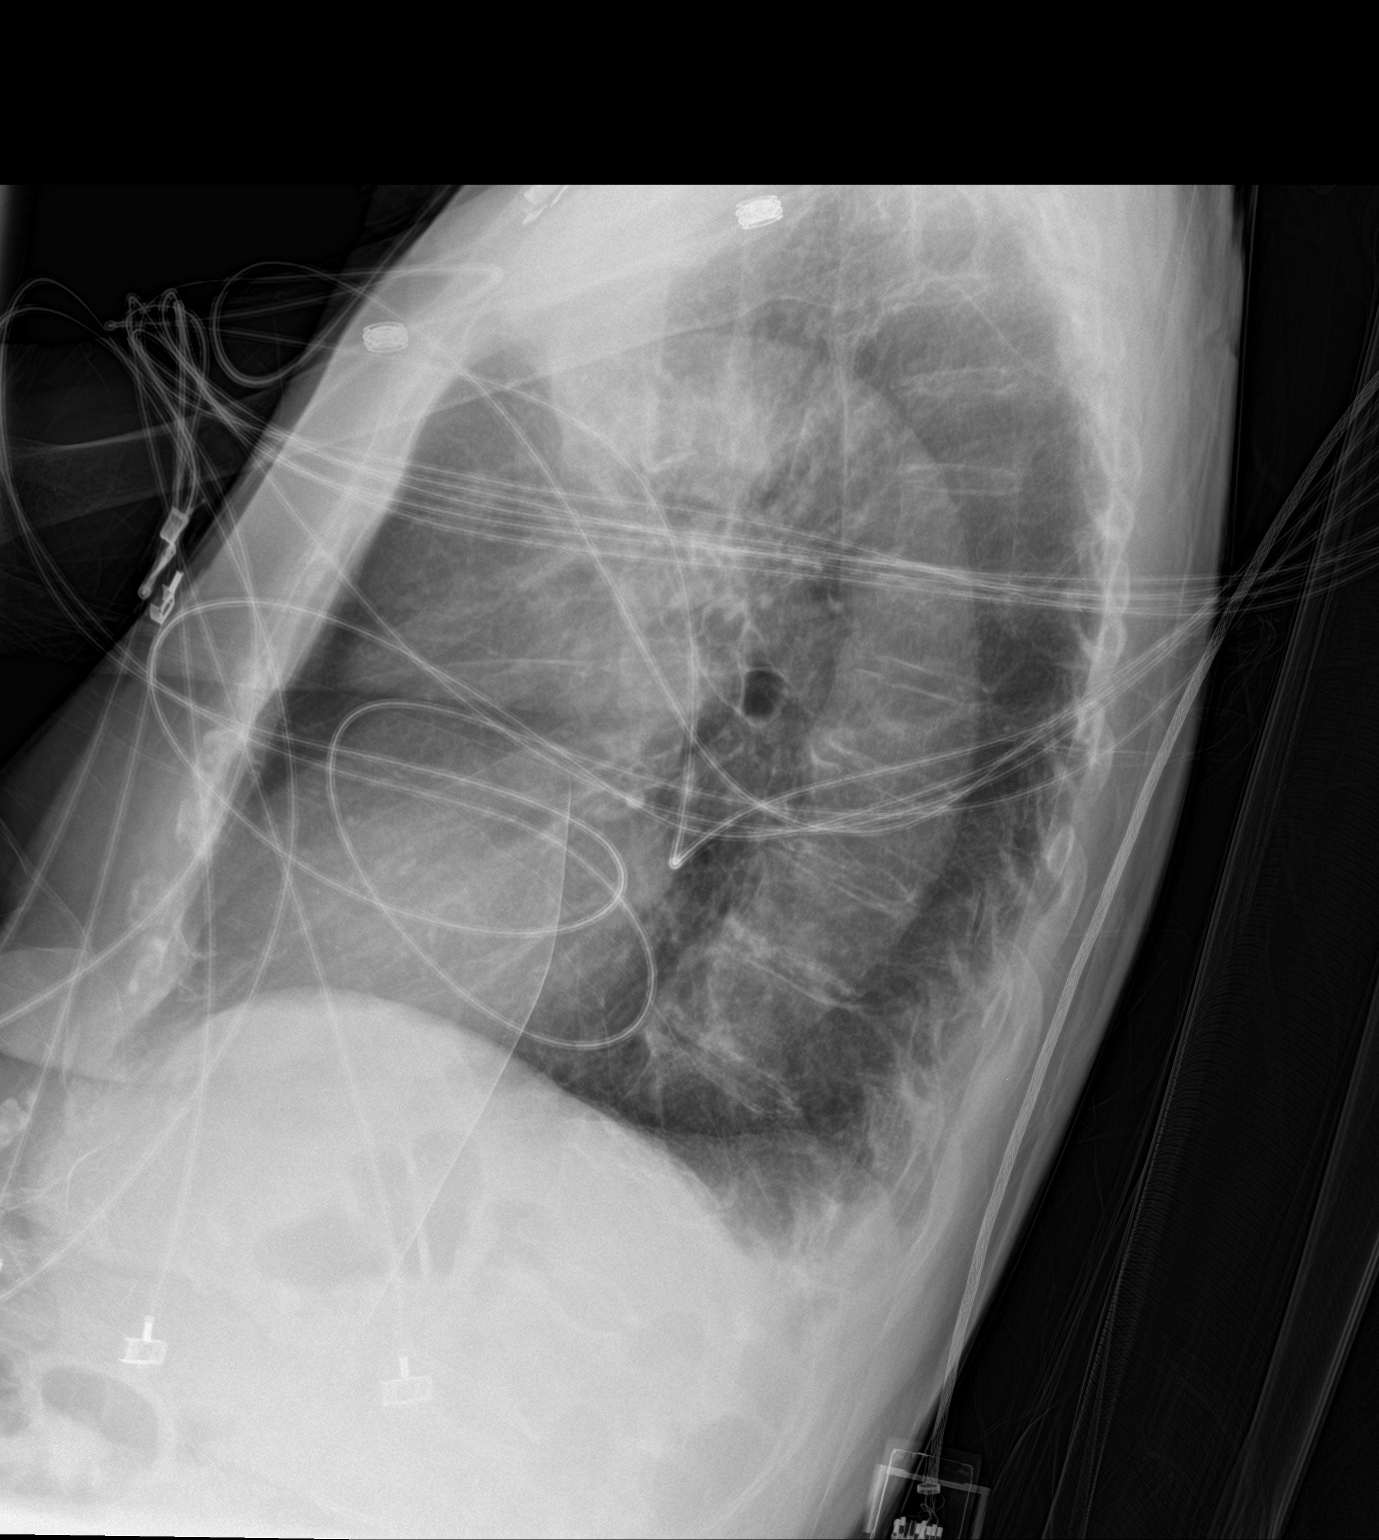

[chest ap]
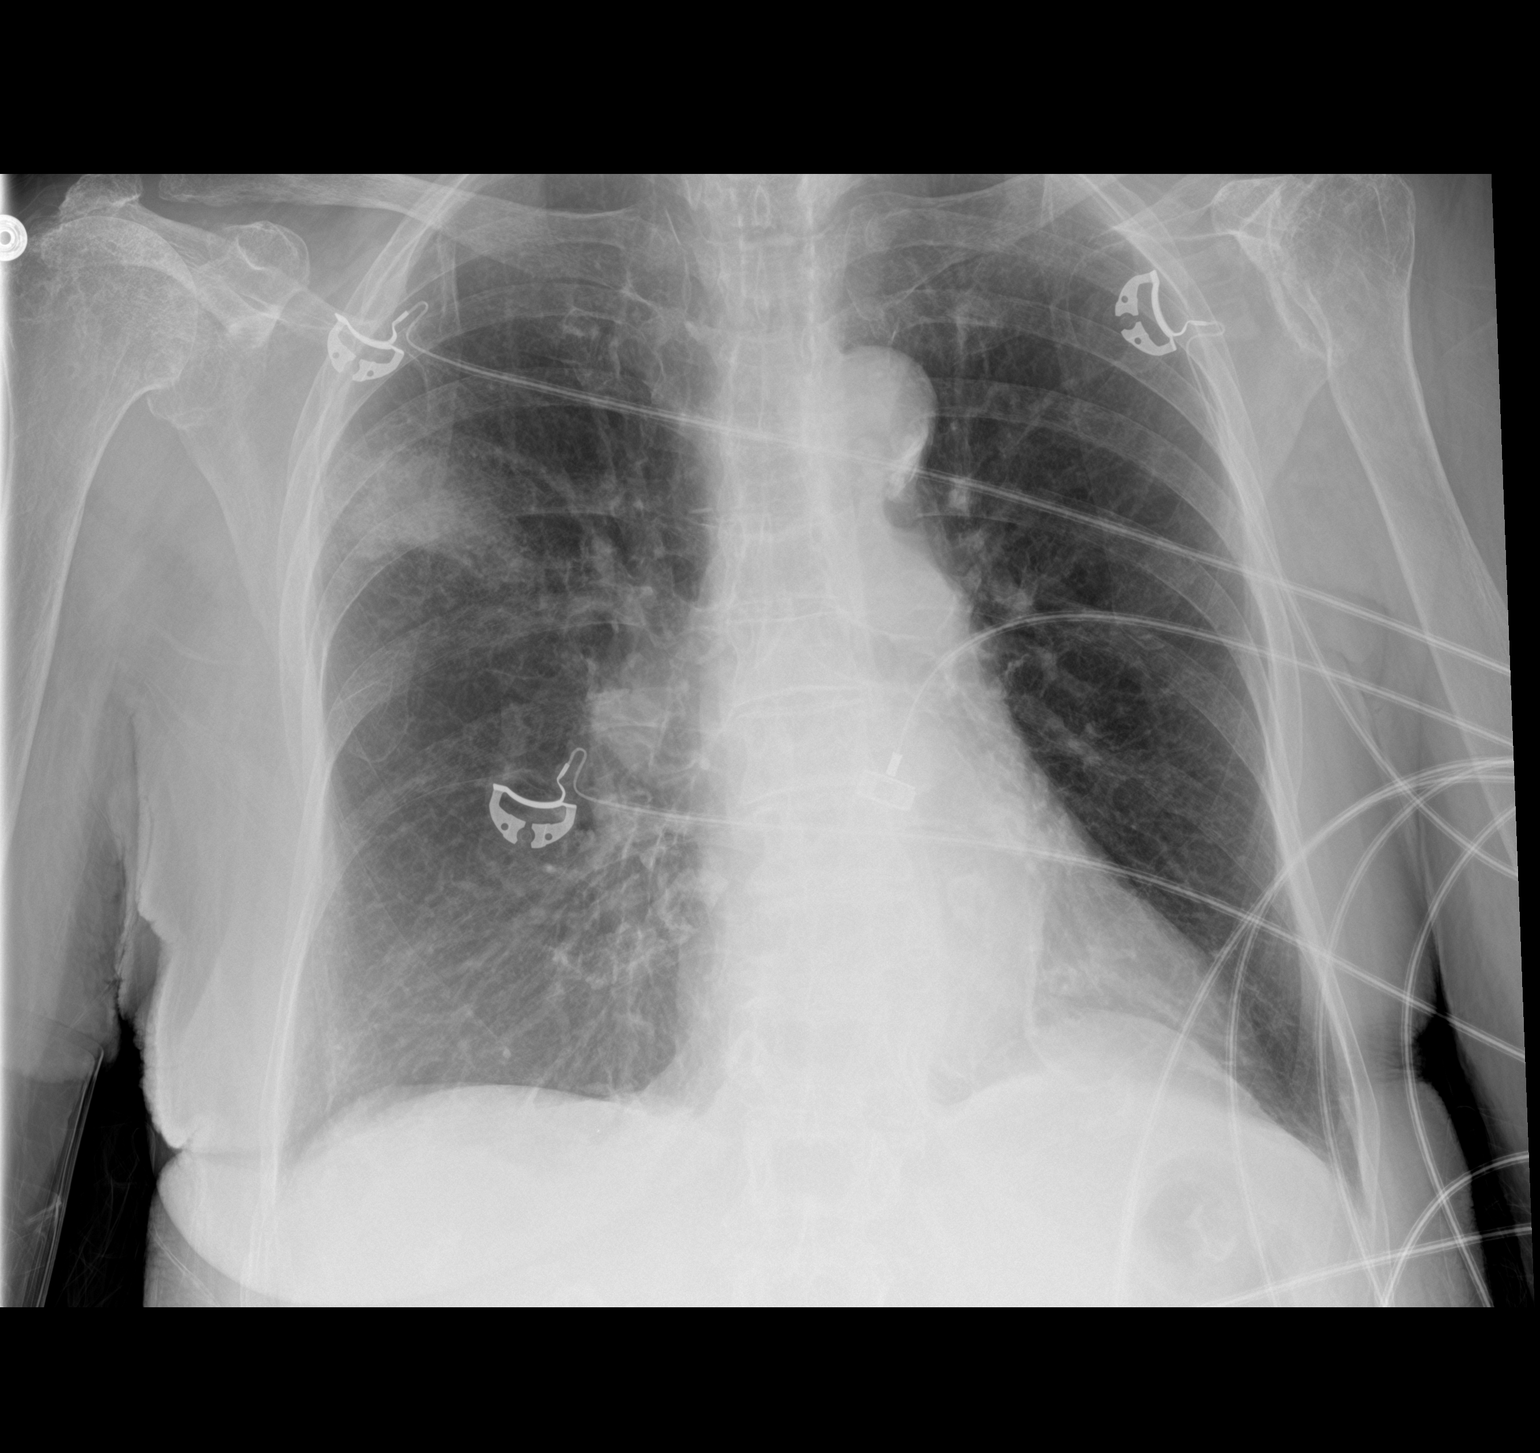

[2 of 2 positions shown; findings below may reference images not displayed]

FINDINGS: Focal ill-defined airspace disease seen in the right upper lobe
which is new since previous study, suspicious for pneumonia. Changes
of COPD are again noted. Left lung remains clear. No evidence of
pleural effusion. Heart size is at the upper limits of normal and
stable.
IMPRESSION: New right upper lobe airspace opacity, suspicious for pneumonia.
Recommend clinical correlation; consider followup PA and lateral
chest X-ray in 3-4 weeks following trial of antibiotic therapy to
ensure resolution/exclude underlying malignancy.

COPD.

## 2017-03-27 ENCOUNTER — Ambulatory Visit: Payer: Medicare Other | Admitting: Endocrinology

## 2017-11-02 IMAGING — DX DG LUMBAR SPINE COMPLETE 4+V
5 series · 5 of 5 positions shown · non-contrast
Comparison: Lumbar spine radiograph dated 06/20/2015

CLINICAL DATA: [AGE] female with fall and trauma to the back

EXAM:
LUMBAR SPINE - COMPLETE 4+ VIEW

[l-spine ap]
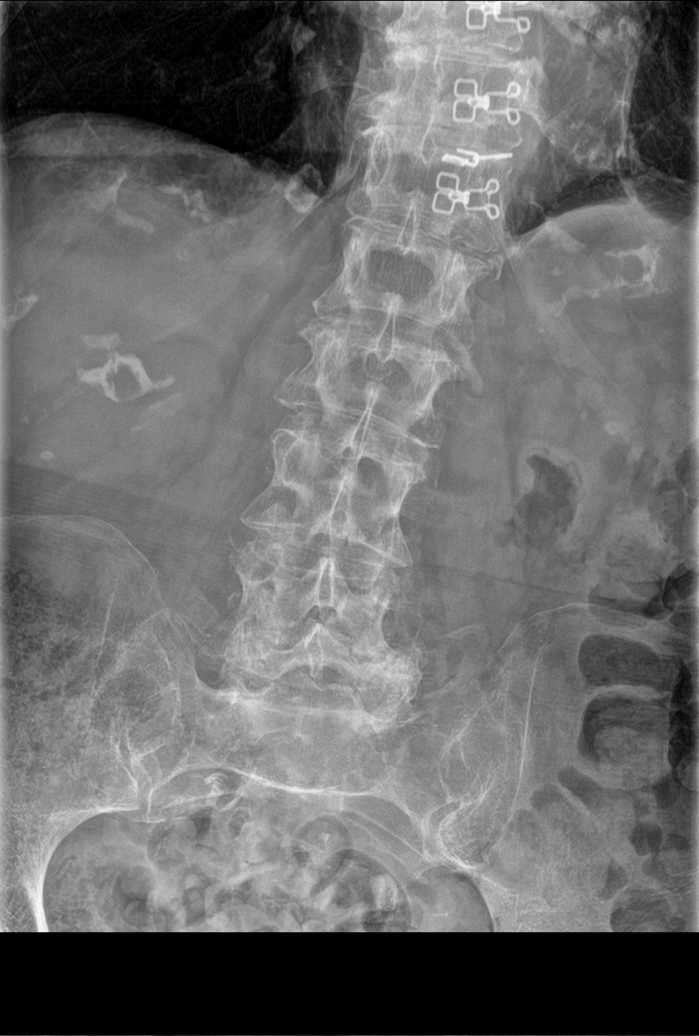

[l-spine obl (1 of 2)]
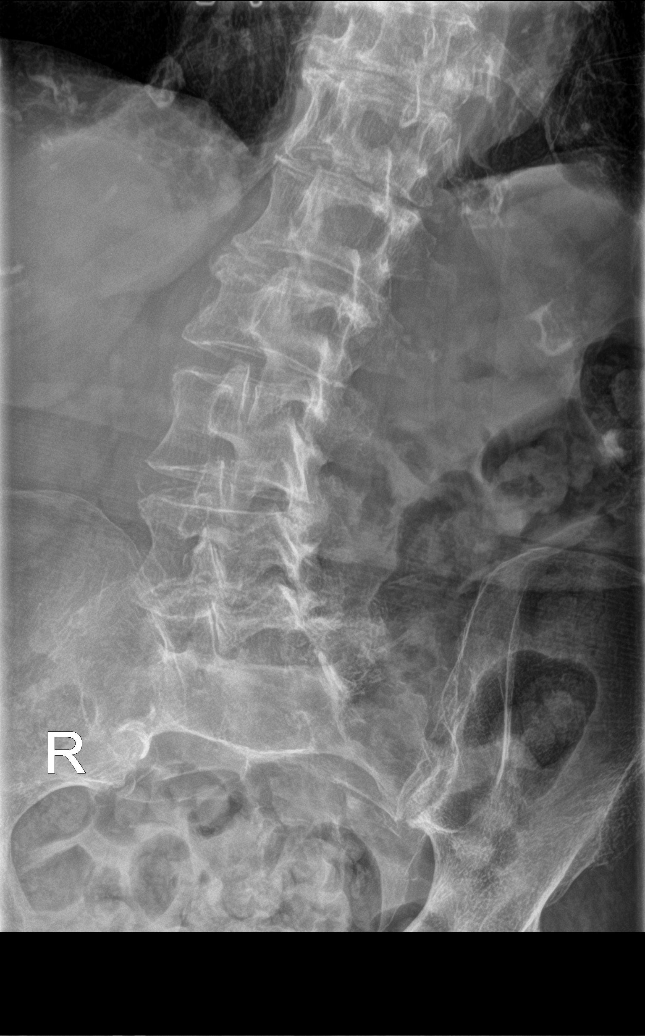

[l-spine obl (2 of 2)]
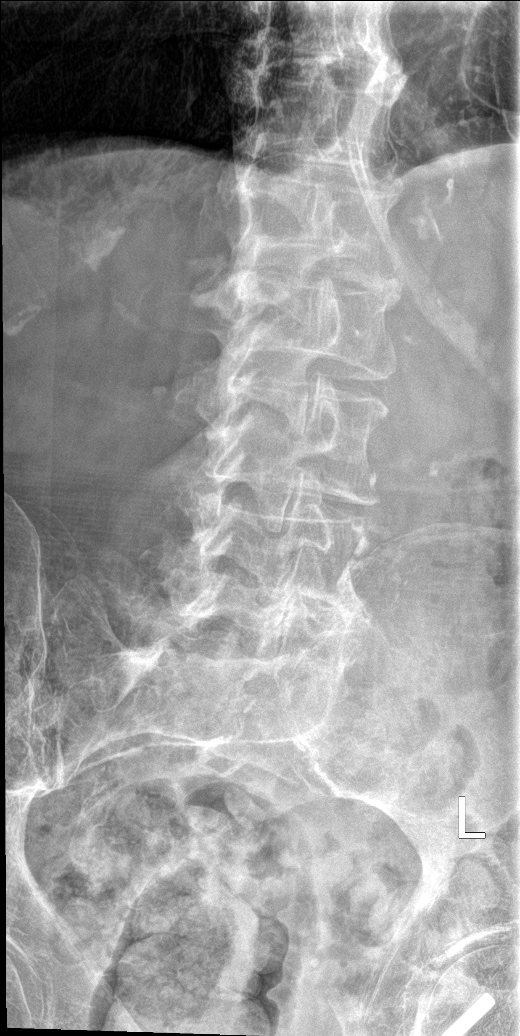

[l-spine lat]
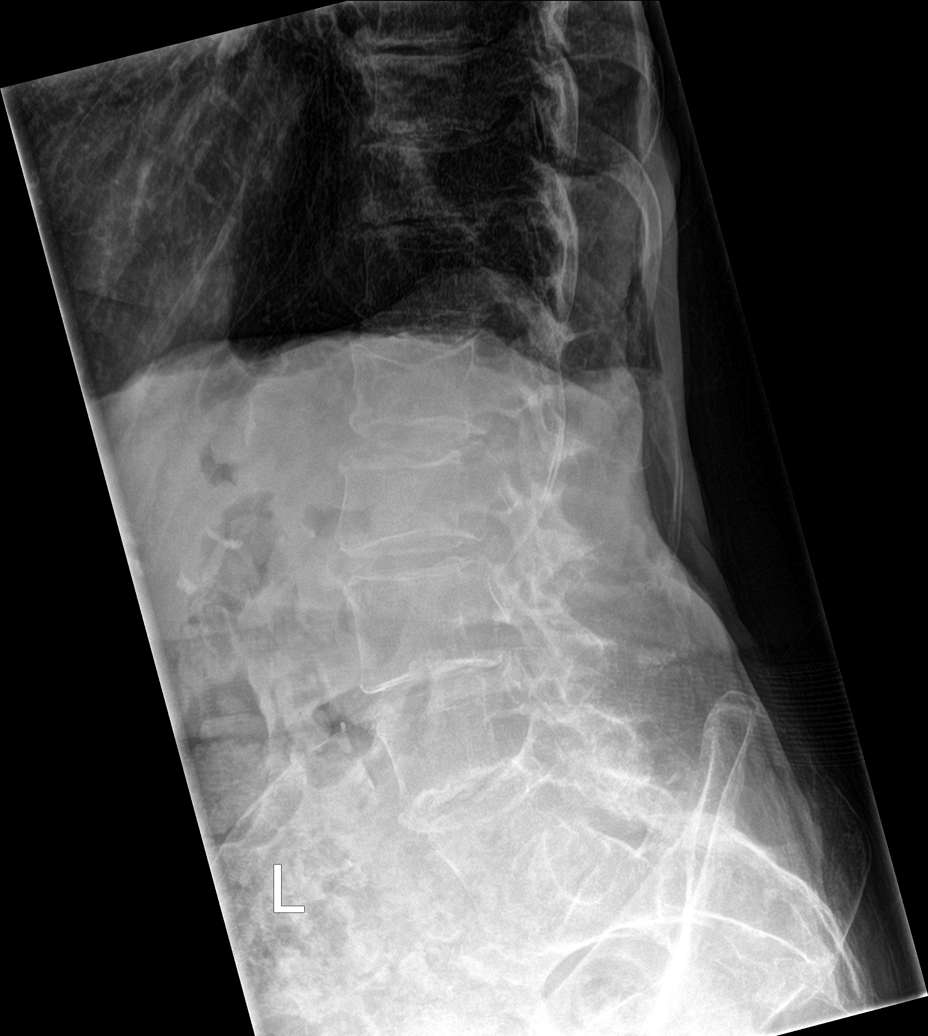

[l-spine spot]
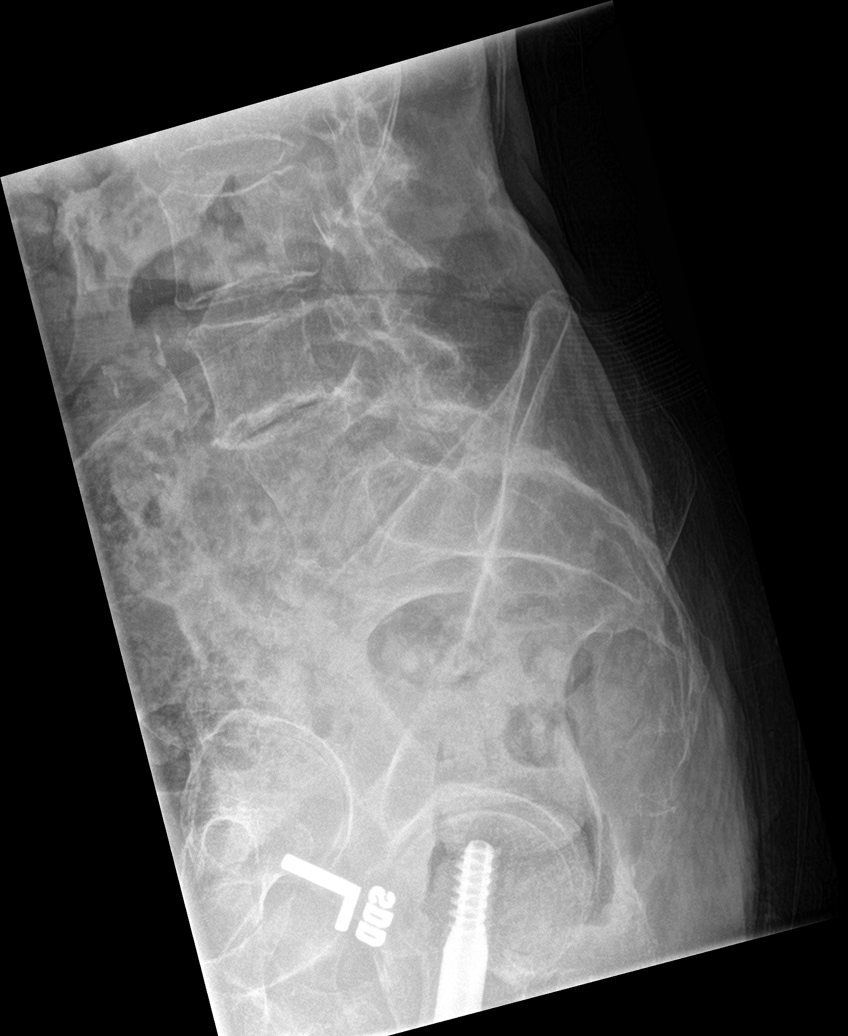

[5 of 5 positions shown; findings below may reference images not displayed]

FINDINGS: There is advanced osteopenia which limits evaluation for fracture.
No definite acute fracture or subluxation identified. There is
stable appearing compression deformity of the superior endplate of
the L1 vertebrae. There multilevel degenerative changes with
multilevel disc desiccation most prominent at L4-L5. There is stable
grade 1 L4-5 anterolisthesis. The soft tissues are grossly
unremarkable.
IMPRESSION: No definite acute fracture or subluxation.

Osteopenia with multilevel degenerative changes.

Stable appearing L1 superior endplate compression deformity.

## 2023-04-05 NOTE — Telephone Encounter (Signed)
This encounter created in error.
